# Patient Record
Sex: Male | Born: 1965 | Race: Black or African American | Hispanic: No | Smoking: Former smoker
Health system: Southern US, Community
[De-identification: ages and names within clinical notes are randomized; demographics above are authoritative.]

## PROBLEM LIST (undated history)

## (undated) DIAGNOSIS — G4730 Sleep apnea, unspecified: Secondary | ICD-10-CM

## (undated) DIAGNOSIS — F419 Anxiety disorder, unspecified: Secondary | ICD-10-CM

## (undated) DIAGNOSIS — I4891 Unspecified atrial fibrillation: Secondary | ICD-10-CM

## (undated) DIAGNOSIS — D649 Anemia, unspecified: Secondary | ICD-10-CM

## (undated) DIAGNOSIS — I10 Essential (primary) hypertension: Secondary | ICD-10-CM

## (undated) DIAGNOSIS — E7800 Pure hypercholesterolemia, unspecified: Secondary | ICD-10-CM

## (undated) DIAGNOSIS — M542 Cervicalgia: Secondary | ICD-10-CM

## (undated) DIAGNOSIS — G4733 Obstructive sleep apnea (adult) (pediatric): Principal | ICD-10-CM

## (undated) HISTORY — DX: Anemia, unspecified: D64.9

## (undated) HISTORY — DX: Obstructive sleep apnea (adult) (pediatric): G47.33

## (undated) HISTORY — DX: Unspecified atrial fibrillation: I48.91

## (undated) HISTORY — PX: NERVE SURGERY: SHX1016

## (undated) HISTORY — PX: OTHER SURGICAL HISTORY: SHX169

## (undated) HISTORY — DX: Sleep apnea, unspecified: G47.30

## (undated) HISTORY — PX: NECK SURGERY: SHX720

---

## 2001-06-22 ENCOUNTER — Emergency Department (HOSPITAL_COMMUNITY): Admission: EM | Admit: 2001-06-22 | Discharge: 2001-06-22 | Payer: Self-pay | Admitting: Emergency Medicine

## 2003-07-24 ENCOUNTER — Emergency Department (HOSPITAL_COMMUNITY): Admission: EM | Admit: 2003-07-24 | Discharge: 2003-07-24 | Payer: Self-pay | Admitting: Emergency Medicine

## 2004-09-18 ENCOUNTER — Ambulatory Visit: Payer: Self-pay | Admitting: Family Medicine

## 2004-09-30 ENCOUNTER — Ambulatory Visit: Payer: Self-pay | Admitting: Family Medicine

## 2005-02-19 ENCOUNTER — Ambulatory Visit: Payer: Self-pay | Admitting: Family Medicine

## 2006-05-05 ENCOUNTER — Ambulatory Visit (HOSPITAL_COMMUNITY): Admission: RE | Admit: 2006-05-05 | Discharge: 2006-05-05 | Payer: Self-pay | Admitting: Family Medicine

## 2007-07-01 ENCOUNTER — Encounter: Payer: Self-pay | Admitting: Family Medicine

## 2010-07-30 NOTE — Letter (Signed)
Summary: rpc chart  rpc chart   Imported By: Curtis Sites 04/11/2010 15:18:49  _____________________________________________________________________  External Attachment:    Type:   Image     Comment:   External Document

## 2013-02-13 ENCOUNTER — Emergency Department (HOSPITAL_COMMUNITY)
Admission: EM | Admit: 2013-02-13 | Discharge: 2013-02-14 | Disposition: A | Discharge status: Home / Self Care | Payer: Managed Care, Other (non HMO) | Attending: Emergency Medicine | Admitting: Emergency Medicine

## 2013-02-13 ENCOUNTER — Emergency Department (HOSPITAL_COMMUNITY): Payer: Managed Care, Other (non HMO)

## 2013-02-13 ENCOUNTER — Encounter (HOSPITAL_COMMUNITY): Payer: Self-pay | Admitting: Emergency Medicine

## 2013-02-13 DIAGNOSIS — I4891 Unspecified atrial fibrillation: Secondary | ICD-10-CM

## 2013-02-13 DIAGNOSIS — Z79899 Other long term (current) drug therapy: Secondary | ICD-10-CM | POA: Insufficient documentation

## 2013-02-13 DIAGNOSIS — R0602 Shortness of breath: Secondary | ICD-10-CM | POA: Insufficient documentation

## 2013-02-13 DIAGNOSIS — I10 Essential (primary) hypertension: Secondary | ICD-10-CM | POA: Insufficient documentation

## 2013-02-13 DIAGNOSIS — E876 Hypokalemia: Secondary | ICD-10-CM

## 2013-02-13 DIAGNOSIS — R0789 Other chest pain: Secondary | ICD-10-CM

## 2013-02-13 DIAGNOSIS — Z87891 Personal history of nicotine dependence: Secondary | ICD-10-CM | POA: Insufficient documentation

## 2013-02-13 HISTORY — DX: Essential (primary) hypertension: I10

## 2013-02-13 LAB — CBC
HCT: 40.6 % (ref 39.0–52.0)
Hemoglobin: 14.2 g/dL (ref 13.0–17.0)
MCH: 31.8 pg (ref 26.0–34.0)
MCHC: 35 g/dL (ref 30.0–36.0)
MCV: 90.8 fL (ref 78.0–100.0)
Platelets: 229 10*3/uL (ref 150–400)
RBC: 4.47 MIL/uL (ref 4.22–5.81)
RDW: 13.4 % (ref 11.5–15.5)
WBC: 7.2 K/uL (ref 4.0–10.5)

## 2013-02-13 LAB — BASIC METABOLIC PANEL WITH GFR
BUN: 14 mg/dL (ref 6–23)
Chloride: 97 meq/L (ref 96–112)
GFR calc Af Amer: >90 mL/min (ref >90)
GFR calc non Af Amer: 86 mL/min — ABNORMAL LOW (ref >90)
Potassium: 2.7 meq/L — CL (ref 3.5–5.1)

## 2013-02-13 LAB — BASIC METABOLIC PANEL
CO2: 24 mEq/L (ref 19–32)
Calcium: 9.6 mg/dL (ref 8.4–10.5)
Creatinine, Ser: 1.03 mg/dL (ref 0.50–1.35)
Glucose, Bld: 105 mg/dL — ABNORMAL HIGH (ref 70–99)
Sodium: 136 mEq/L (ref 135–145)

## 2013-02-13 LAB — HEPATIC FUNCTION PANEL
ALT: 43 U/L (ref 0–53)
AST: 48 U/L — ABNORMAL HIGH (ref 0–37)
Albumin: 4.5 g/dL (ref 3.5–5.2)
Alkaline Phosphatase: 103 U/L (ref 39–117)
Bilirubin, Direct: 0.2 mg/dL (ref 0.0–0.3)
Indirect Bilirubin: 0.6 mg/dL (ref 0.3–0.9)
Total Bilirubin: 0.8 mg/dL (ref 0.3–1.2)
Total Protein: 8.3 g/dL (ref 6.0–8.3)

## 2013-02-13 LAB — TROPONIN I: Troponin I: <0.3 ng/mL (ref <0.30)

## 2013-02-13 LAB — LIPASE, BLOOD: Lipase: 38 U/L (ref 11–59)

## 2013-02-13 MED ORDER — POTASSIUM CHLORIDE ER 20 MEQ PO TBCR: 20.0000 meq | EXTENDED_RELEASE_TABLET | Freq: Two times a day (BID) | ORAL | Status: DC

## 2013-02-13 MED ORDER — POTASSIUM CHLORIDE CRYS ER 20 MEQ PO TBCR
40.0000 meq | EXTENDED_RELEASE_TABLET | Freq: Once | ORAL | Status: AC
  Administered 2013-02-14: 40 meq via ORAL
  Filled 2013-02-13: qty 2

## 2013-02-13 MED ORDER — POTASSIUM CHLORIDE 10 MEQ/100ML IV SOLN
10.0000 meq | Freq: Once | INTRAVENOUS | Status: AC
  Administered 2013-02-13: 10 meq via INTRAVENOUS
  Filled 2013-02-13: qty 100

## 2013-02-13 MED ORDER — POTASSIUM CHLORIDE CRYS ER 20 MEQ PO TBCR
40.0000 meq | EXTENDED_RELEASE_TABLET | Freq: Once | ORAL | Status: AC
  Administered 2013-02-13: 40 meq via ORAL
  Filled 2013-02-13: qty 2

## 2013-02-13 MED ORDER — AMLODIPINE BESYLATE 5 MG PO TABS: 5.0000 mg | ORAL_TABLET | Freq: Every day | ORAL | Status: DC

## 2013-02-13 NOTE — ED Notes (Signed)
Patient states he was watching TV and began having chest pain about 15 minutes ago.  Patient states he has a little shortness of breath, but denies N/V or diaphoresis.  States feels like a bunch of weight sitting on his chest going into his throat.

## 2013-02-13 NOTE — ED Provider Notes (Signed)
CSN: [id number]     Arrival date & time 02/13/13  2008 History    This chart was scribed for Gavin Pound. Oletta Lamas, MD, by Yevette Edwards, ED Scribe. This patient was seen in room APA18/APA18 and the patient's care was started at 21:10.   First MD Initiated Contact with Patient 02/13/13 2108     Chief Complaint  Patient presents with  . Chest Pain    The history is provided by the patient. No language interpreter was used.   HPI Comments: William Kim is a 47 y.o. male, with HTN, who presents to the Emergency Department complaining of sudden-onset chest pain which began while he was watching television approximately two hours ago. The episode of chest pain persisted for almost half an hour; it is currently resolved at bedside. The pain radiated towards his jaw. He rates the pain as 9/10, and he describes the pain as "elephant sitting on his chest and going into his throat." The pt also experienced mild SOB in addition to the chest pain. He denies experiencing any anxiety, nausea, emesis, abdominal pain, or diaphoresis.  The pt had beef enchiladas and salad about thirty minutes before the episode of chest pain. He reports that he ate his typical amount at dinner. The pt drinks a couple of [employer]ite nightly. He states that he has experienced similar symptoms in the past, including an episode three months ago, but much more mild. He denies any h/o cardiac issues within his immediate family. He denies any recent travels. He also denies any known allergies. 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   Past Medical History  Diagnosis Date  . Hypertension    History reviewed. No pertinent past surgical history. History reviewed. No pertinent family history. History  Substance Use Topics  . Smoking status: Former Games developer  . Smokeless tobacco: Not on file  . Alcohol Use: Yes    Review of Systems  Constitutional: Negative for diaphoresis.  Respiratory: Positive for shortness of  breath.   Cardiovascular: Positive for chest pain.  Gastrointestinal: Negative for nausea, vomiting and abdominal pain.  Psychiatric/Behavioral: The patient is not nervous/anxious.   All other systems reviewed and are negative.    Allergies  Review of patient's allergies indicates no known allergies.  Home Medications   Current Outpatient Rx  Name  Route  Sig  Dispense  Refill  . amLODipine (NORVASC) 5 MG tablet   Oral   Take 1 tablet (5 mg total) by mouth daily.   20 tablet   0   . potassium chloride 20 MEQ TBCR   Oral   Take 20 mEq by mouth 2 (two) times daily.   6 tablet   0    Triage Vitals: BP 141/76  Pulse 58  Temp(Src) 98.2 F (36.8 C) (Oral)  Resp 20  Ht 6\' 2"  (1.88 m)  Wt 217 lb (98.431 kg)  BMI 27.85 kg/m2  SpO2 98%  Physical Exam  Nursing note and vitals reviewed. Constitutional: He is oriented to person, place, and time. He appears well-developed and well-nourished. No distress.  HENT:  Head: Normocephalic and atraumatic.  Eyes: EOM are normal.  Neck: Neck supple. No tracheal deviation present.  Cardiovascular: Regular rhythm and normal heart sounds.   No murmur heard. Tachycardic.   Pulmonary/Chest: Effort normal and breath sounds normal. No respiratory distress.  Abdominal: Soft. There is no tenderness.  Genitourinary:  Clear urine.   Musculoskeletal: Normal range of motion.  Neurological: He is  alert and oriented to person, place, and time.  Skin: Skin is warm and dry.  Psychiatric: He has a normal mood and affect. His behavior is normal.    ED Course   DIAGNOSTIC STUDIES:  Oxygen Saturation is 98% on room air, normal by my interpretation.    COORDINATION OF CARE:  9:22 PM- Discussed treatment plan with patient, and the patient agreed to the plan.   Procedures (including critical care time)  Labs Reviewed  BASIC METABOLIC PANEL - Abnormal; Notable for the following:    Potassium 2.7 (*)    Glucose, Bld 105 (*)    GFR calc non Af  Amer 86 (*)    All other components within normal limits  HEPATIC FUNCTION PANEL - Abnormal; Notable for the following:    AST 48 (*)    All other components within normal limits  POTASSIUM - Abnormal; Notable for the following:    Potassium 2.9 (*)    All other components within normal limits  CBC  TROPONIN I  LIPASE, BLOOD  TROPONIN I   No results found. 1. Atrial fibrillation with RVR   2. Chest pressure   3. Hypokalemia        ECG at time 20:16 shows atrial fibrillation with RVR at rate 145, LVH, ST depression and T wave abn inferiorly, likely related to rate.  Abn ECG.  Normal axis.     10:31 PM Repeat ECG at time 22:21 shows NSR at rate 99, LVH, non specific T wave abn inferiorly. Given continued non specific changes, which could be still reflective of hypokalemia, pt is encouraged to follow up with cardiology this week and with Dr. Regino Schultze closely and to repeat BMP and consider BP medication change and to curb alcohol use can be discussed and managed.     11:45PM ptsigned put to Dr. Hyacinth Meeker to follow up on second troponin and repeat potsssium.nofurther episodes of arrythmia seen in the ED.  MDM  I personally performed the services described in this documentation, which was scribed in my presence. The recorded information has been reviewed and considered.   Pt drank a lot of beer tonight after working 6 days straight. Also takes HCTZ fro BP control.  K+ is down to 2.7.  This likely led to cardiac iiritation and led to atrial fib causing pressure.  Pt's pressure is resolved and this seems to coincide based on monitor with conversion to sinus.  Will cycle enzymes, replenish K+ and he is told to follow up with Dr. Regino Schultze to consider change of BP meds especially if he continues to drink a lot of beer and diurese.      Gavin Pound. Oletta Lamas, MD 02/17/13 2208

## 2013-02-13 NOTE — ED Notes (Signed)
EKG performed by Lilia Pro, RN in Room 18; looked for old EKG in M[facility] and there was none.  Handed current EKG to Dr Clarene Duke.

## 2013-02-13 NOTE — Discharge Instructions (Signed)
 Atrial Fibrillation Your caregiver has diagnosed you with atrial fibrillation (AFib). The heart normally beats very regularly; AFib is a type of irregular heartbeat. The heart rate may be faster or slower than normal. This can prevent your heart from pumping as well as it should. AFib can be constant (chronic) or intermittent (paroxysmal). CAUSES  Atrial fibrillation may be caused by:  Heart disease, including heart attack, coronary artery disease, heart failure, diseases of the heart valves, and others.  Blood clot in the lungs (pulmonary embolism).  Pneumonia or other infections.  Chronic lung disease.  Thyroid  disease.  Toxins. These include alcohol, some medications (such as decongestant medications or diet pills), and caffeine. In some people, no cause for AFib can be found. This is referred to as Lone Atrial Fibrillation. SYMPTOMS   Palpitations or a fluttering in your chest.  A vague sense of chest discomfort.  Shortness of breath.  Sudden onset of lightheadedness or weakness. Sometimes, the first sign of AFib can be a complication of the condition. This could be a stroke or heart failure. DIAGNOSIS  Your description of your condition may make your caregiver suspicious of atrial fibrillation. Your caregiver will examine your pulse to determine if fibrillation is present. An EKG (electrocardiogram) will confirm the diagnosis. Further testing may help determine what caused you to have atrial fibrillation. This may include chest x-ray, echocardiogram, blood tests, or CT scans. PREVENTION  If you have previously had atrial fibrillation, your caregiver may advise you to avoid substances known to cause the condition (such as stimulant medications, and possibly caffeine or alcohol). You may be advised to use medications to prevent recurrence. Proper treatment of any underlying condition is important to help prevent recurrence. PROGNOSIS  Atrial fibrillation does tend to become a  chronic condition over time. It can cause significant complications (see below). Atrial fibrillation is not usually immediately life-threatening, but it can shorten your life expectancy. This seems to be worse in women. If you have lone atrial fibrillation and are under 28 years old, the risk of complications is very low, and life expectancy is not shortened. RISKS AND COMPLICATIONS  Complications of atrial fibrillation can include stroke, chest pain, and heart failure. Your caregiver will recommend treatments for the atrial fibrillation, as well as for any underlying conditions, to help minimize risk of complications. TREATMENT  Treatment for AFib is divided into several categories:  Treatment of any underlying condition.  Converting you out of AFib into a regular (sinus) rhythm.  Controlling rapid heart rate.  Prevention of blood clots and stroke. Medications and procedures are available to convert your atrial fibrillation to sinus rhythm. However, recent studies have shown that this may not offer you any advantage, and cardiac experts are continuing research and debate on this topic. More important is controlling your rapid heartbeat. The rapid heartbeat causes more symptoms, and places strain on your heart. Your caregiver will advise you on the use of medications that can control your heart rate. Atrial fibrillation is a strong stroke risk. You can lessen this risk by taking blood thinning medications such as Coumadin (warfarin), or sometimes aspirin. These medications need close monitoring by your caregiver. Over-medication can cause bleeding. Too little medication may not protect against stroke. HOME CARE INSTRUCTIONS   If your caregiver prescribed medicine to make your heartbeat more normally, take as directed.  If blood thinners were prescribed by your caregiver, take EXACTLY as directed.  Perform blood tests EXACTLY as directed.  Quit smoking. Smoking increases your cardiac and  lung  (pulmonary) risks.  DO NOT drink alcohol.  DO NOT drink caffeinated drinks (e.g. coffee, soda, chocolate, and leaf teas). You may drink decaffeinated coffee, soda or tea.  If you are overweight, you should choose a reduced calorie diet to lose weight. Please see a registered dietitian if you need more information about healthy weight loss. DO NOT USE DIET PILLS as they may aggravate heart problems.  If you have other heart problems that are causing AFib, you may need to eat a low salt, fat, and cholesterol diet. Your caregiver will tell you if this is necessary.  Exercise every day to improve your physical fitness. Stay active unless advised otherwise.  If your caregiver has given you a follow-up appointment, it is very important to keep that appointment. Not keeping the appointment could result in heart failure or stroke. If there is any problem keeping the appointment, you must call back to this facility for assistance. SEEK MEDICAL CARE IF:  You notice a change in the rate, rhythm or strength of your heartbeat.  You develop an infection or any other change in your overall health status. SEEK IMMEDIATE MEDICAL CARE IF:   You develop chest pain, abdominal pain, sweating, weakness or feel sick to your stomach (nausea).  You develop shortness of breath.  You develop swollen feet and ankles.  You develop dizziness, numbness, or weakness of your face or limbs, or any change in vision or speech. MAKE SURE YOU:   Understand these instructions.  Will watch your condition.  Will get help right away if you are not doing well or get worse. Document Released: 06/16/2005 Document Revised: 09/08/2011 Document Reviewed: 01/23/2010 [facility] Patient Information 2014 [place], [place].     Hypokalemia Hypokalemia means a low potassium level in the blood.Potassium is an electrolyte that helps regulate the amount of fluid in the body. It also stimulates muscle contraction and maintains a stable  acid-base balance.Most of the body's potassium is inside of cells, and only a very small amount is in the blood. Because the amount in the blood is so small, minor changes can have big effects. PREPARATION FOR TEST Testing for potassium requires taking a blood sample taken by needle from a vein in the arm. The skin is cleaned thoroughly before the sample is drawn. There is no other special preparation needed. NORMAL VALUES Potassium levels below 3.5 mEq/L are abnormally low. Levels above 5.1 mEq/L are abnormally high. Ranges for normal findings may vary among different laboratories and hospitals. You should always check with your doctor after having lab work or other tests done to discuss the meaning of your test results and whether your values are considered within normal limits. MEANING OF TEST  Your caregiver will go over the test results with you and discuss the importance and meaning of your results, as well as treatment options and the need for additional tests, if necessary. A potassium level is frequently part of a routine medical exam. It is usually included as part of a whole panel of tests for several blood salts (such as Sodium and Chloride). It may be done as part of follow-up when a low potassium level was found in the past or other blood salts are suspected of being out of balance. A low potassium level might be suspected if you have one or more of the following:  Symptoms of weakness.  Abnormal heart rhythms.  High blood pressure and are taking medication to control this, especially water  pills (diuretics).  Kidney disease that  can affect your potassium level .  Diabetes requiring the use of insulin. The potassium may fall after taking insulin, especially if the diabetes had been out of control for a while.  A condition requiring the use of cortisone-type medication or certain types of antibiotics.  Vomiting and/or diarrhea for more than a day or two.  A stomach or  intestinal condition that may not permit appropriate absorption of potassium.  Fainting episodes.  Mental confusion. OBTAINING TEST RESULTS It is your responsibility to obtain your test results. Ask the lab or department performing the test when and how you will get your results.  Please contact your caregiver directly if you have not received the results within one week. At that time, ask if there is anything different or new you should be doing in relation to the results. TREATMENT Hypokalemia can be treated with potassium supplements taken by mouth and/or adjustments in your current medications. A diet high in potassium is also helpful. Foods with high potassium content are:  Peas, lentils, lima beans, nuts, and dried fruit.  Whole grain and bran cereals and breads.  Fresh fruit, vegetables (bananas, cantaloupe, grapefruit, oranges, tomatoes, honeydew melons, potatoes).  Orange and tomato juices.  Meats. If potassium supplement has been prescribed for you today or your medications have been adjusted, see your personal caregiver in time02 for a re-check. SEEK MEDICAL CARE IF:  There is a feeling of worsening weakness.  You experience repeated chest palpitations.  You are diabetic and having difficulty keeping your blood sugars in the normal range.  You are experiencing vomiting and/or diarrhea.  You are having difficulty with any of your regular medications. SEEK IMMEDIATE MEDICAL CARE IF:  You experience chest pain, shortness of breath, or episodes of dizziness.  You have been having vomiting or diarrhea for more than 2 days.  You have a fainting episode. MAKE SURE YOU:   Understand these instructions.  Will watch your condition.  Will get help right away if you are not doing well or get worse. Document Released: 06/16/2005 Document Revised: 09/08/2011 Document Reviewed: 05/27/2008 [facility] Patient Information 2014 [place], [place].   Foods Rich in  Potassium Food / Potassium (mg)  Apricots, dried,  cup / 378 mg   Apricots, raw, 1 cup halves / 401 mg   Avocado,  / 487 mg   Banana, 1 large / 487 mg   Beef, lean, round, 3 oz / 202 mg   Cantaloupe, 1 cup cubes / 427 mg   Dates, medjool, 5 whole / 835 mg   Ham, cured, 3 oz / 212 mg   Lentils, dried,  cup / 458 mg   Lima beans, frozen,  cup / 258 mg   Orange, 1 large / 333 mg   Orange juice, 1 cup / 443 mg   Peaches, dried,  cup / 398 mg   Peas, split, cooked,  cup / 355 mg   Potato, boiled, 1 medium / 515 mg   Prunes, dried, uncooked,  cup / 318 mg   Raisins,  cup / 309 mg   Salmon, pink, raw, 3 oz / 275 mg   Sardines, canned , 3 oz / 338 mg   Tomato, raw, 1 medium / 292 mg   Tomato juice, 6 oz / 417 mg   Turkey, 3 oz / 349 mg  Document Released: 06/16/2005 Document Revised: 02/26/2011 Document Reviewed: 10/30/2008 [facility] Patient Information 2012 [place], [place].

## 2013-02-13 NOTE — ED Notes (Signed)
CRITICAL VALUE ALERT  Critical value received:  K+ 2.7 Date of notification:  02/13/2013 Time of notification:  2112 Critical value read back:yes Nurse who received alert:  Charlette Caffey, RN  MD notified: Dr. Oletta Lamas [id number]

## 2013-02-14 LAB — TROPONIN I: Troponin I: <0.3 ng/mL (ref <0.30)

## 2013-02-14 LAB — POTASSIUM: Potassium: 2.9 mEq/L — ABNORMAL LOW (ref 3.5–5.1)

## 2013-02-14 NOTE — ED Provider Notes (Signed)
K improved, pt has no return of afib - given residual K and d/c in good condition without symptoms.  norvasc started per Dr. Oletta Lamas, - pt given instructions and asked to f/u and obtain copy of EMR to share with his physician.  Expresses understanding.  Vida Roller, MD 02/14/13 [phone]

## 2016-04-07 ENCOUNTER — Ambulatory Visit (INDEPENDENT_AMBULATORY_CARE_PROVIDER_SITE_OTHER): Payer: Managed Care, Other (non HMO) | Admitting: Otolaryngology

## 2016-04-07 DIAGNOSIS — K1121 Acute sialoadenitis: Secondary | ICD-10-CM | POA: Diagnosis not present

## 2016-04-07 DIAGNOSIS — D37030 Neoplasm of uncertain behavior of the parotid salivary glands: Secondary | ICD-10-CM | POA: Diagnosis not present

## 2016-04-10 ENCOUNTER — Other Ambulatory Visit (INDEPENDENT_AMBULATORY_CARE_PROVIDER_SITE_OTHER): Payer: Self-pay | Admitting: Otolaryngology

## 2016-04-10 DIAGNOSIS — D37030 Neoplasm of uncertain behavior of the parotid salivary glands: Secondary | ICD-10-CM

## 2016-04-21 ENCOUNTER — Ambulatory Visit (HOSPITAL_COMMUNITY)
Admission: RE | Admit: 2016-04-21 | Discharge: 2016-04-21 | Disposition: A | Discharge status: Home / Self Care | Payer: Managed Care, Other (non HMO) | Source: Ambulatory Visit | Attending: Otolaryngology | Admitting: Otolaryngology

## 2016-04-21 DIAGNOSIS — D37030 Neoplasm of uncertain behavior of the parotid salivary glands: Secondary | ICD-10-CM | POA: Insufficient documentation

## 2016-04-21 MED ORDER — IOPAMIDOL (ISOVUE-300) INJECTION 61%
75.0000 mL | Freq: Once | INTRAVENOUS | Status: AC | PRN
  Administered 2016-04-21: 75 mL via INTRAVENOUS

## 2016-04-23 ENCOUNTER — Emergency Department (HOSPITAL_COMMUNITY): Payer: Managed Care, Other (non HMO)

## 2016-04-23 ENCOUNTER — Encounter (HOSPITAL_COMMUNITY): Payer: Self-pay | Admitting: Emergency Medicine

## 2016-04-23 ENCOUNTER — Emergency Department (HOSPITAL_COMMUNITY)
Admission: EM | Admit: 2016-04-23 | Discharge: 2016-04-23 | Disposition: A | Discharge status: Home / Self Care | Payer: Managed Care, Other (non HMO) | Attending: Emergency Medicine | Admitting: Emergency Medicine

## 2016-04-23 DIAGNOSIS — Z79899 Other long term (current) drug therapy: Secondary | ICD-10-CM | POA: Insufficient documentation

## 2016-04-23 DIAGNOSIS — R0789 Other chest pain: Secondary | ICD-10-CM

## 2016-04-23 DIAGNOSIS — I10 Essential (primary) hypertension: Secondary | ICD-10-CM | POA: Insufficient documentation

## 2016-04-23 DIAGNOSIS — Z87891 Personal history of nicotine dependence: Secondary | ICD-10-CM | POA: Insufficient documentation

## 2016-04-23 DIAGNOSIS — R0602 Shortness of breath: Secondary | ICD-10-CM | POA: Diagnosis present

## 2016-04-23 DIAGNOSIS — R002 Palpitations: Secondary | ICD-10-CM | POA: Insufficient documentation

## 2016-04-23 HISTORY — DX: Pure hypercholesterolemia, unspecified: E78.00

## 2016-04-23 LAB — CBC
HEMATOCRIT: 38 % — AB (ref 39.0–52.0)
Hemoglobin: 12.8 g/dL — ABNORMAL LOW (ref 13.0–17.0)
MCH: 30.7 pg (ref 26.0–34.0)
MCHC: 33.7 g/dL (ref 30.0–36.0)
MCV: 91.1 fL (ref 78.0–100.0)
Platelets: 224 10*3/uL (ref 150–400)
RBC: 4.17 MIL/uL — AB (ref 4.22–5.81)
RDW: 14.5 % (ref 11.5–15.5)
WBC: 6 10*3/uL (ref 4.0–10.5)

## 2016-04-23 LAB — BASIC METABOLIC PANEL
ANION GAP: 6 (ref 5–15)
BUN: 15 mg/dL (ref 6–20)
CHLORIDE: 108 mmol/L (ref 101–111)
CO2: 25 mmol/L (ref 22–32)
Calcium: 9 mg/dL (ref 8.9–10.3)
Creatinine, Ser: 0.93 mg/dL (ref 0.61–1.24)
Glucose, Bld: 92 mg/dL (ref 65–99)
POTASSIUM: 3.4 mmol/L — AB (ref 3.5–5.1)
SODIUM: 139 mmol/L (ref 135–145)

## 2016-04-23 LAB — I-STAT TROPONIN, ED
TROPONIN I, POC: 0 ng/mL (ref 0.00–0.08)
Troponin i, poc: 0 ng/mL (ref 0.00–0.08)

## 2016-04-23 NOTE — ED Provider Notes (Signed)
[facility] DEPT Provider Note   CSN: [id number] Arrival date & time: 04/23/16  1217  By signing my name below, I, Higinio Plan, attest that this documentation has been prepared under the direction and in the presence of Forde Dandy, MD . Electronically Signed: Higinio Plan, Scribe. 04/23/2016. 1:34 PM.  History   Chief Complaint Chief Complaint  Patient presents with  . Chest Pain  . Shortness of Breath   The history is provided by the patient. No language interpreter was used.   HPI Comments: William Kim is a 50 y.o. male with PMHx of HTN and HLD,  who presents to the Emergency Department complaining of intermittent shortness of breath and palpitations that began 2 months ago, with episode this morning. Denies chest pain with this, but states if anything he may feel a tiny amount of chest pressure. He has had 4-5 episodes of this over the last 2 months.  Episodes primarily, while he is getting ready for work in the morning, and self resolved after a few hours. States that initially he is very panicked and anxious with this, but it eases off after a few minutes.He states he has experienced  the same symptoms every 2 weeks. He reports his last episode was yesterday but he began to experience a sensation of palpitations while at work today which is why he visited the ED. Denies that his symptoms come on during exertional activity although he does get breathless if he is on his feet for too long moving or runny around at work (but this feels differently than that episode sob that he presents with today).   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 He denies nausea, vomiting, leg pain or swelling, loss of consciousness, cough, fever,  hx of PE, recent long travel or immobilzation. He also denies FHx of heart problems. Pt notes he quit smoking 4 years go.   Past Medical History:  Diagnosis Date  . High cholesterol   . Hypertension    There are no active problems to display for this patient.  History reviewed. No pertinent surgical history.  Home Medications    Prior to Admission medications   Medication Sig Start Date End Date Taking? Authorizing Provider  amLODipine (NORVASC) 5 MG tablet Take 1 tablet (5 mg total) by mouth daily. 02/13/13  Yes Kingsley Spittle, MD  Ascorbic Acid (VITAMIN C PO) Take 1 tablet by mouth daily.   Yes Historical Provider, MD  Omega-3 Fatty Acids (FISH OIL PO) Take 1 capsule by mouth at bedtime.   Yes Historical Provider, MD  oxyCODONE-acetaminophen (PERCOCET/ROXICET) 5-325 MG tablet Take 1 tablet by mouth 3 (three) times daily as needed for pain. 03/29/16  Yes Historical Provider, MD  simvastatin (ZOCOR) 40 MG tablet Take 1 tablet by mouth at bedtime. 03/04/16  Yes Historical Provider, MD  valACYclovir (VALTREX) 500 MG tablet Take 1 tablet by mouth daily. 03/30/16  Yes Historical Provider, MD  diclofenac (VOLTAREN) 75 MG EC tablet Take 1 tablet by mouth daily as needed for pain. 03/07/16   Historical Provider, MD    Family History History reviewed. No pertinent family history.  Social History Social History  Substance Use Topics  . Smoking status:  Former Smoker  . Smokeless tobacco: Never Used  . Alcohol use Yes     Comment: "1-2 beers and maybe a mixed drink a day"   Allergies   Review of patient's allergies indicates no known allergies.  Review of Systems Review of Systems  Constitutional: Negative for fever.  Respiratory: Positive for shortness of breath. Negative for cough.   Cardiovascular: Positive for chest pain (described as pressure) and palpitations. Negative for leg swelling.  Gastrointestinal: Negative for nausea and vomiting.  Neurological: Negative for syncope.  All other  systems reviewed and are negative.  Physical Exam Updated Vital Signs BP 131/84   Pulse 74   Temp 98.4 F (36.9 C) (Oral)   Resp 22   Ht 6' (1.829 m)   Wt 215 lb (97.5 kg)   SpO2 97%   BMI 29.16 kg/m   Physical Exam Physical Exam  Nursing note and vitals reviewed. Constitutional: Well developed, well nourished, non-toxic, and in no acute distress Head: Normocephalic and atraumatic.  Mouth/Throat: Oropharynx is clear and moist.  Neck: Normal range of motion. Neck supple.  Cardiovascular: Normal rate and regular rhythm.   Pulmonary/Chest: Effort normal and breath sounds normal.  Abdominal: Soft. There is no tenderness. There is no rebound and no guarding.  Musculoskeletal: Normal range of motion.  Neurological: Alert, no facial droop, fluent speech, moves all extremities symmetrically Skin: Skin is warm and dry.  Psychiatric: Cooperative  ED Treatments / Results  Labs (all labs ordered are listed, but only abnormal results are displayed) Labs Reviewed  BASIC METABOLIC PANEL - Abnormal; Notable for the following:       Result Value   Potassium 3.4 (*)    All other components within normal limits  CBC - Abnormal; Notable for the following:    RBC 4.17 (*)    Hemoglobin 12.8 (*)    HCT 38.0 (*)    All other components within normal limits  I-STAT TROPOININ, ED  I-STAT TROPOININ, ED    EKG  EKG Interpretation  Date/Time:  Wednesday April 23 2016 15:57:05 EDT Ventricular Rate:  85 PR Interval:  158 QRS Duration: 92 QT Interval:  354 QTC Calculation: 421 R Axis:   59 Text Interpretation:  Sinus rhythm Probable left atrial enlargement Similar to prior EKG  Confirmed by Talene Glastetter MD, Yacoub Diltz [phone]) on 04/23/2016 4:05:59 PM       Radiology Dg Chest 2 View  Result Date: 04/23/2016 CLINICAL DATA:  Episodes of chest tightness, occur intermittently over the past 6 weeks. EXAM: CHEST  2 VIEW COMPARISON:  02/13/2013. FINDINGS: The heart size and mediastinal contours are  within normal limits. Both lungs are clear. The visualized skeletal structures are unremarkable. IMPRESSION: No active cardiopulmonary disease.  Stable appearance from priors. Electronically Signed   By: Staci Righter M.D.   On: 04/23/2016 14:10   Ct Soft Tissue Neck W Contrast  Result Date: 04/22/2016 CLINICAL DATA:  50 year old male with swelling right-sided neck for the past year. Question neoplasm parotid gland. Initial encounter. EXAM: CT NECK WITH CONTRAST TECHNIQUE: Multidetector CT imaging of the neck was performed using the standard protocol following the bolus administration of intravenous contrast. CONTRAST:  65mL ISOVUE-300 IOPAMIDOL (ISOVUE-300) INJECTION 61% COMPARISON:  None. FINDINGS: Pharynx and larynx: Symmetric mild prominence of lymphoid tissue of Waldeyer's ring without mass identified. Salivary glands: Where patient has palpable a abnormality of the right parotid gland, no parotid lesion is identified. Minimal asymmetry of soft tissue along the anterior margin of the right external auditory  canal. This may represent a normal finding (located superior to palpable abnormality). However, if patient has symptoms referable to this region, MR imaging with attention to this region can be obtained. Thyroid: No mass. Lymph nodes: Scattered normal size and fatty containing lymph nodes without worrisome adenopathy. Vascular: Negative. Limited intracranial: Negative. Visualized orbits: Negative. Mastoids and visualized paranasal sinuses: Clear. Skeleton: Cervical spondylotic changes C6-7. Upper chest: No worrisome lesion. Other: Negative. IMPRESSION: Where patient has palpable a abnormality of the right parotid gland, no parotid lesion is identified. Minimal asymmetry of soft tissue along the anterior margin of the right external auditory canal. This may represent an normal finding (located superior to right parotid region palpable abnormality). However, if patient has symptoms referable to this  region, MR imaging with attention to this region can be obtained for further delineation. Electronically Signed   By: Genia Del M.D.   On: 04/22/2016 07:28    Procedures Procedures (including critical care time)  Medications Ordered in ED Medications - No data to display  DIAGNOSTIC STUDIES:  Oxygen Saturation is 97% on RA, normal by my interpretation.    COORDINATION OF CARE:  1:30 PM Discussed treatment plan with pt at bedside and pt agreed to plan.  Initial Impression / Assessment and Plan / ED Course  I have reviewed the triage vital signs and the nursing notes.  Pertinent labs & imaging results that were available during my care of the patient were reviewed by me and considered in my medical decision making (see chart for details).  Clinical Course    50 year old male Who presents with episodic shortness of breath and palpitations. Atypical presentation for cardiopulmonary processes, as typically the symptoms do not occur with activity or exertion primarily in the morning before starting work. He currently is asymptomatic. He is well-appearing in no acute distress with stable vital signs. Exam otherwise non-focal.  EKG with non-specific repol changes but no acute ischemic changes. Serial troponins negative and EKG w/o acute dynamic type changes. Heart score of 3, for age, HTN and HLD, and non-specific EKG. Still considered lower risk and at this time, I think he could continue outpatient work-up with PCP with stress test and ECHO.   CXR showing no infiltrate, edema or other acute cardiopulmonary processes. Symptoms not suggestive of acute CHF. The symptoms are also atypical for that a PE. He is PERC negative.   The patient appears reasonably screened and/or stabilized for discharge and I doubt any other medical condition or other [facility] requiring further screening, evaluation, or treatment in the ED at this time prior to discharge.  Strict return and follow-up instructions  reviewed. He expressed understanding of all discharge instructions and felt comfortable with the plan of care.   I personally performed the services described in this documentation, which was scribed in my presence. The recorded information has been reviewed and is accurate.   Final Clinical Impressions(s) / ED Diagnoses   Final diagnoses:  Shortness of breath  Palpitations  Chest pressure    New Prescriptions Discharge Medication List as of 04/23/2016  4:23 PM       Forde Dandy, MD 04/23/16 [phone]

## 2016-04-23 NOTE — Discharge Instructions (Signed)
Please follow-up closely with your primary care doctor to arrange stress test or any further testing as needed.  Return without fail for worsening symptoms, including passing out, worsening pain, more frequent episodes, difficulty breathing or any other symptoms concerning to you.

## 2016-04-23 NOTE — ED Triage Notes (Signed)
Patient complaining of shortness of breath, chest pressure, and "feeling like my heart is pounding" x 5-6 weeks. States today he is short of breath with chest pressure.

## 2016-05-01 ENCOUNTER — Ambulatory Visit (INDEPENDENT_AMBULATORY_CARE_PROVIDER_SITE_OTHER): Payer: Managed Care, Other (non HMO) | Admitting: Otolaryngology

## 2016-05-01 DIAGNOSIS — R590 Localized enlarged lymph nodes: Secondary | ICD-10-CM | POA: Diagnosis not present

## 2016-06-05 ENCOUNTER — Ambulatory Visit (INDEPENDENT_AMBULATORY_CARE_PROVIDER_SITE_OTHER): Payer: Managed Care, Other (non HMO) | Admitting: Otolaryngology

## 2016-06-05 DIAGNOSIS — D487 Neoplasm of uncertain behavior of other specified sites: Secondary | ICD-10-CM | POA: Diagnosis not present

## 2017-01-27 ENCOUNTER — Encounter (HOSPITAL_COMMUNITY): Payer: Self-pay | Admitting: Emergency Medicine

## 2017-01-27 ENCOUNTER — Emergency Department (HOSPITAL_COMMUNITY)
Admission: EM | Admit: 2017-01-27 | Discharge: 2017-01-27 | Disposition: A | Discharge status: Home / Self Care | Payer: 59 | Attending: Emergency Medicine | Admitting: Emergency Medicine

## 2017-01-27 ENCOUNTER — Emergency Department (HOSPITAL_COMMUNITY): Payer: 59

## 2017-01-27 DIAGNOSIS — I4891 Unspecified atrial fibrillation: Secondary | ICD-10-CM | POA: Diagnosis not present

## 2017-01-27 DIAGNOSIS — I10 Essential (primary) hypertension: Secondary | ICD-10-CM | POA: Insufficient documentation

## 2017-01-27 DIAGNOSIS — I480 Paroxysmal atrial fibrillation: Secondary | ICD-10-CM | POA: Insufficient documentation

## 2017-01-27 DIAGNOSIS — Z79899 Other long term (current) drug therapy: Secondary | ICD-10-CM | POA: Insufficient documentation

## 2017-01-27 DIAGNOSIS — Z87891 Personal history of nicotine dependence: Secondary | ICD-10-CM | POA: Insufficient documentation

## 2017-01-27 DIAGNOSIS — R079 Chest pain, unspecified: Secondary | ICD-10-CM | POA: Diagnosis present

## 2017-01-27 LAB — CBC WITH DIFFERENTIAL/PLATELET
BASOS PCT: 0 %
Basophils Absolute: 0 10*3/uL (ref 0.0–0.1)
Eosinophils Absolute: 0.1 10*3/uL (ref 0.0–0.7)
Eosinophils Relative: 1 %
HEMATOCRIT: 41 % (ref 39.0–52.0)
Hemoglobin: 13.9 g/dL (ref 13.0–17.0)
LYMPHS PCT: 32 %
Lymphs Abs: 2 10*3/uL (ref 0.7–4.0)
MCH: 30.7 pg (ref 26.0–34.0)
MCHC: 33.9 g/dL (ref 30.0–36.0)
MCV: 90.5 fL (ref 78.0–100.0)
MONO ABS: 0.5 10*3/uL (ref 0.1–1.0)
MONOS PCT: 7 %
NEUTROS ABS: 3.7 10*3/uL (ref 1.7–7.7)
Neutrophils Relative %: 60 %
Platelets: 195 10*3/uL (ref 150–400)
RBC: 4.53 MIL/uL (ref 4.22–5.81)
RDW: 14.4 % (ref 11.5–15.5)
WBC: 6.3 10*3/uL (ref 4.0–10.5)

## 2017-01-27 LAB — TSH: TSH: 1.434 u[IU]/mL (ref 0.350–4.500)

## 2017-01-27 LAB — BASIC METABOLIC PANEL
ANION GAP: 7 (ref 5–15)
BUN: 16 mg/dL (ref 6–20)
CALCIUM: 9.3 mg/dL (ref 8.9–10.3)
CO2: 25 mmol/L (ref 22–32)
Chloride: 108 mmol/L (ref 101–111)
Creatinine, Ser: 0.94 mg/dL (ref 0.61–1.24)
GFR calc Af Amer: >60 mL/min (ref >60)
GFR calc non Af Amer: >60 mL/min (ref >60)
GLUCOSE: 100 mg/dL — AB (ref 65–99)
Potassium: 3.8 mmol/L (ref 3.5–5.1)
Sodium: 140 mmol/L (ref 135–145)

## 2017-01-27 LAB — I-STAT TROPONIN, ED: Troponin i, poc: 0 ng/mL (ref 0.00–0.08)

## 2017-01-27 MED ORDER — DILTIAZEM HCL 100 MG IV SOLR
5.0000 mg/h | INTRAVENOUS | Status: DC
  Administered 2017-01-27: 5 mg/h via INTRAVENOUS
  Filled 2017-01-27: qty 100

## 2017-01-27 MED ORDER — DILTIAZEM LOAD VIA INFUSION
20.0000 mg | Freq: Once | INTRAVENOUS | Status: AC
  Administered 2017-01-27: 20 mg via INTRAVENOUS
  Filled 2017-01-27: qty 20

## 2017-01-27 MED ORDER — APIXABAN 5 MG PO TABS: 5.0000 mg | ORAL_TABLET | Freq: Two times a day (BID) | ORAL | 0 refills | Status: DC

## 2017-01-27 MED ORDER — DILTIAZEM HCL ER COATED BEADS 120 MG PO CP24
120.0000 mg | ORAL_CAPSULE | Freq: Once | ORAL | Status: AC
  Administered 2017-01-27: 120 mg via ORAL
  Filled 2017-01-27: qty 1

## 2017-01-27 MED ORDER — DILTIAZEM HCL ER COATED BEADS 120 MG PO CP24: 120.0000 mg | ORAL_CAPSULE | Freq: Every day | ORAL | 0 refills | Status: DC

## 2017-01-27 NOTE — ED Notes (Signed)
Cardizem drip stopped per Dr. Venora Maples verbal order.

## 2017-01-27 NOTE — ED Provider Notes (Addendum)
[facility] DEPT Provider Note   CSN: [id number] Arrival date & time: 01/27/17  1217     History   Chief Complaint Chief Complaint  Patient presents with  . Chest Pain    HPI William Kim is a 51 y.o. male.  HPI Patient is a 51 year old male with a history of intermittent palpitations associated shortness breath over the past 2 years.  He presents emergency department in atrial fibrillation with rapid ventricular response.  He states his had symptoms over the past 2 days.  In 2014 he was seen in emergency department and diagnosed with atrial fibrillation with rapid ventricular response but he never followed up with cardiology.  He admits to several beers daily and a mixed drink every day.  He denies excessive caffeine intake.  No recent cough or congestion.  Denies orthopnea.  No unilateral leg swelling.   Past Medical History:  Diagnosis Date  . High cholesterol   . Hypertension     There are no active problems to display for this patient.   History reviewed. No pertinent surgical history.     Home Medications    Prior to Admission medications   Medication Sig Start Date End Date Taking? Authorizing Provider  amLODipine (NORVASC) 5 MG tablet Take 1 tablet (5 mg total) by mouth daily. 02/13/13  Yes Kingsley Spittle, MD  Ascorbic Acid (VITAMIN C PO) Take 1 tablet by mouth daily.   Yes [provider]  Cranberry 1000 MG CAPS Take 1 capsule by mouth daily.   Yes [provider]  Nyoka Cowden Tea 150 MG CAPS Take 1 capsule by mouth daily.   Yes [provider]  Omega-3 Fatty Acids (FISH OIL PO) Take 1 capsule by mouth at bedtime.   Yes [provider]  simvastatin (ZOCOR) 40 MG tablet Take 1 tablet by mouth at bedtime. 03/04/16  Yes [provider]  valACYclovir (VALTREX) 500 MG tablet Take 1 tablet by mouth daily. 03/30/16  Yes [provider]  diclofenac (VOLTAREN) 75 MG EC tablet Take 1 tablet by mouth daily as needed for  pain. 03/07/16   [provider]  oxyCODONE-acetaminophen (PERCOCET/ROXICET) 5-325 MG tablet Take 1 tablet by mouth 3 (three) times daily as needed for pain. 03/29/16   [provider]    Family History History reviewed. No pertinent family history.  Social History Social History  Substance Use Topics  . Smoking status: Former Research scientist (life sciences)  . Smokeless tobacco: Never Used  . Alcohol use Yes     Comment: "1-2 beers and maybe a mixed drink a day"     Allergies   Patient has no known allergies.   Review of Systems Review of Systems  All other systems reviewed and are negative.    Physical Exam Updated Vital Signs BP 114/70   Pulse 85   Temp 98.3 F (36.8 C) (Oral)   Resp 14   Ht 6' (1.829 m)   Wt 102.1 kg (225 lb)   SpO2 94%   BMI 30.52 kg/m   Physical Exam  Constitutional: He is oriented to person, place, and time. He appears well-developed and well-nourished.  HENT:  Head: Normocephalic and atraumatic.  Eyes: EOM are normal.  Neck: Normal range of motion.  Cardiovascular: Intact distal pulses.   Tachycardic rate.  Irregularly irregular rhythm  Pulmonary/Chest: Effort normal and breath sounds normal. No respiratory distress.  Abdominal: Soft. He exhibits no distension. There is no tenderness.  Musculoskeletal: Normal range of motion.  Neurological: He is alert  and oriented to person, place, and time.  Skin: Skin is warm and dry.  Psychiatric: He has a normal mood and affect. Judgment normal.  Nursing note and vitals reviewed.    ED Treatments / Results  Labs (all labs ordered are listed, but only abnormal results are displayed) Labs Reviewed  BASIC METABOLIC PANEL - Abnormal; Notable for the following:       Result Value   Glucose, Bld 100 (*)    All other components within normal limits  CBC WITH DIFFERENTIAL/PLATELET  TSH  I-STAT TROPONIN, ED    EKG  EKG Interpretation #1  Date/Time:  Tuesday January 27 2017 12:23:09 EDT Ventricular  Rate:  120 PR Interval:    QRS Duration: 86 QT Interval:  283 QTC Calculation: 400 R Axis:   51 Text Interpretation:  Atrial fibrillation Borderline repolarization abnormality afib new since prior tracing Confirmed by Jola Schmidt [phone]) on 01/27/2017 1:46:03 PM        ECG interpretation #2   Date: 01/27/2017  Rate: 77   Rhythm: normal sinus rhythm  QRS Axis: normal  Intervals: normal  ST/T Wave abnormalities: normal  Conduction Disutrbances: none  Narrative Interpretation:   Old EKG Reviewed: afib resolved as compared to ecg earlier in ER visit    Radiology Dg Chest 2 View  Result Date: 01/27/2017 CLINICAL DATA:  Tachycardia, shortness of breath, and chest discomfort. EXAM: CHEST  2 VIEW COMPARISON:  PA and lateral chest x-ray of April 23, 2016 FINDINGS: The lungs are adequately inflated. The interstitial markings are slightly more conspicuous than on the previous study. There is no alveolar infiltrate or pleural effusion. The heart and pulmonary vascularity are normal. The mediastinum is normal in width. The bony thorax exhibits no acute abnormality. IMPRESSION: Mildly increased interstitial markings may reflect noncardiac pulmonary edema or early interstitial pneumonia. There is no alveolar pneumonia nor other acute cardiopulmonary abnormality. Electronically Signed   By: David  Martinique M.D.   On: 01/27/2017 13:30    Procedures .Critical Care Performed by: Jola Schmidt Authorized by: Jola Schmidt     Total critical care time: 33 minutes Critical care time was exclusive of separately billable procedures and treating other patients. Critical care was necessary to treat or prevent imminent or life-threatening deterioration. Critical care was time spent personally by me on the following activities: development of treatment plan with patient and/or surrogate as well as nursing, discussions with consultants, evaluation of patient's response to treatment, examination of  patient, obtaining history from patient or surrogate, ordering and performing treatments and interventions, ordering and review of laboratory studies, ordering and review of radiographic studies, pulse oximetry and re-evaluation of patient's condition.  ++++++++++++++++++++++++++++++++++++++++++++++   Medications Ordered in ED Medications  diltiazem (CARDIZEM) 1 mg/mL load via infusion 20 mg (20 mg Intravenous Bolus from Bag 01/27/17 1240)    And  diltiazem (CARDIZEM) 100 mg in dextrose 5 % 100 mL (1 mg/mL) infusion (5 mg/hr Intravenous New Bag/Given 01/27/17 1240)  diltiazem (CARDIZEM CD) 24 hr capsule 120 mg (120 mg Oral Given 01/27/17 1359)     Initial Impression / Assessment and Plan / ED Course  I have reviewed the triage vital signs and the nursing notes.  Pertinent labs & imaging results that were available during my care of the patient were reviewed by me and considered in my medical decision making (see chart for details).   Presenting with active palpitations and chest pain. afib with RVR, highest rate noted to be 144. IV cardizem bolus and  drip now. Will follow closely while in ER receiving continuous infusion    CHA2DS2/VAS Stroke Risk Points  = 1  >= 2 Points: High Risk  1 - 1.99 Points: Medium Risk  0 Points: Low Risk   This score determines the patient's risk of having a stroke if the  patient has atrial fibrillation.    1:48 PM Converted to sinus rhythm. Will stop IV cardizem and switch to oral cardizem. Will plan on 4 weeks of anticoagulation with eliquis. Cardiology/afib follow up. Will recommend decreasing ETOH consumption. Plan on home with oral cardizem. pcp follow up as well      Final Clinical Impressions(s) / ED Diagnoses   Final diagnoses:  None    New Prescriptions New Prescriptions   No medications on file     Jola Schmidt, MD 01/27/17 1411    Jola Schmidt, MD 01/27/17 1426

## 2017-01-29 ENCOUNTER — Telehealth (HOSPITAL_COMMUNITY): Payer: Self-pay | Admitting: *Deleted

## 2017-01-29 NOTE — Telephone Encounter (Signed)
Pt referred to afib clinic by ED.  vcml not set up, Pt cld back and scheduled

## 2017-01-30 ENCOUNTER — Encounter (HOSPITAL_COMMUNITY): Payer: Self-pay | Admitting: Nurse Practitioner

## 2017-01-30 ENCOUNTER — Ambulatory Visit (HOSPITAL_COMMUNITY)
Admission: RE | Admit: 2017-01-30 | Discharge: 2017-01-30 | Disposition: A | Discharge status: Home / Self Care | Payer: 59 | Source: Ambulatory Visit | Attending: Nurse Practitioner | Admitting: Nurse Practitioner

## 2017-01-30 ENCOUNTER — Telehealth: Payer: Self-pay | Admitting: *Deleted

## 2017-01-30 VITALS — BP 110/80 | HR 75 | Ht 72.0 in | Wt 231.6 lb

## 2017-01-30 DIAGNOSIS — Z7902 Long term (current) use of antithrombotics/antiplatelets: Secondary | ICD-10-CM | POA: Insufficient documentation

## 2017-01-30 DIAGNOSIS — E7800 Pure hypercholesterolemia, unspecified: Secondary | ICD-10-CM | POA: Diagnosis not present

## 2017-01-30 DIAGNOSIS — I4891 Unspecified atrial fibrillation: Secondary | ICD-10-CM

## 2017-01-30 DIAGNOSIS — Z87891 Personal history of nicotine dependence: Secondary | ICD-10-CM | POA: Diagnosis not present

## 2017-01-30 DIAGNOSIS — I10 Essential (primary) hypertension: Secondary | ICD-10-CM | POA: Diagnosis not present

## 2017-01-30 DIAGNOSIS — I480 Paroxysmal atrial fibrillation: Secondary | ICD-10-CM | POA: Diagnosis not present

## 2017-01-30 MED ORDER — AMLODIPINE BESYLATE 5 MG PO TABS: 2.5000 mg | ORAL_TABLET | Freq: Every day | ORAL | 2 refills | Status: DC

## 2017-01-30 MED ORDER — SIMVASTATIN 10 MG PO TABS: 10.0000 mg | ORAL_TABLET | Freq: Every day | ORAL | 2 refills | Status: DC

## 2017-01-30 MED ORDER — DILTIAZEM HCL ER COATED BEADS 120 MG PO CP24: 120.0000 mg | ORAL_CAPSULE | Freq: Every day | ORAL | 2 refills | Status: DC

## 2017-01-30 NOTE — Telephone Encounter (Signed)
-----   Message from Juluis Mire, RN sent at 01/30/2017  9:58 AM EDT ----- Regarding: sleep study Pt needs sleep study for afib.  Thanks William Kim

## 2017-01-30 NOTE — Telephone Encounter (Deleted)
-----   Message from Juluis Mire, RN sent at 01/30/2017  9:58 AM EDT ----- Regarding: sleep study Pt needs sleep study for afib.  Thanks stacy

## 2017-01-30 NOTE — Progress Notes (Signed)
Primary Care Physician: Redmond School, MD Referring Physician: [facility] ER f/u   William Kim is a 51 y.o. male with a h/o HTN, alcohol use and untreated probable sleep apnea. It is documented in Epic in 2014 that he was treated for afib in the ER. The pt reports that he would have issues with irregular heart beat off and on since then but episodes would be short lived. He noticed the episodes were more frequent over the last few weeks and recently present to the ER, 7/31, with afib with RVR.He was given IV diltiazem and converted. Discharged home on xarelto 20 mg for a chadsvasc score of 1 and daily diltiazem 120 mg a day.  In the afib clinic, 8/3, He is in [place]. No further afib. He was drinking 6 alcoholic drinks a night and was told in the ER to diminish alcohol intake. He also reports significant snoring/ apnea and about 8 years ago, had a sleep study but when the cpap mask was applied, he did not like the feeling and left the test. 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 He denies tobacco or caffeine use.  Today, he denies symptoms of palpitations, chest pain, shortness of breath, orthopnea, PND, lower extremity edema, dizziness, presyncope, syncope, or neurologic sequela. The patient is tolerating medications without difficulties and is otherwise without complaint today.   Past Medical History:  Diagnosis Date  . High cholesterol   . Hypertension    No past surgical history on file.  Current Outpatient Prescriptions  Medication Sig Dispense Refill  . amLODipine (NORVASC) 5 MG tablet Take 0.5 tablets (2.5 mg total) by mouth daily. 30 tablet 2  . apixaban (ELIQUIS STARTER PACK) 5 MG TABS tablet Take 1 tablet (5 mg total) by mouth 2 (two) times daily. 60 tablet 0  . Ascorbic Acid (VITAMIN C PO) Take 1 tablet by mouth daily.    . Cranberry 1000 MG CAPS Take 1 capsule by mouth daily.    Marland Kitchen diltiazem (CARDIZEM CD) 120 MG 24 hr capsule Take 1 capsule (120 mg total) by mouth daily. 90  capsule 2  . Omega-3 Fatty Acids (FISH OIL PO) Take 1 capsule by mouth at bedtime.    . simvastatin (ZOCOR) 10 MG tablet Take 1 tablet (10 mg total) by mouth at bedtime. 90 tablet 2  . valACYclovir (VALTREX) 500 MG tablet Take 1 tablet by mouth daily.     No current facility-administered medications for this encounter.     No Known Allergies  Social History   Social History  . Marital status: Married    Spouse name: N/A  . Number of children: N/A  . Years of education: N/A   Occupational History  . Not on file.   Social History Main Topics  . Smoking status: Former Research scientist (life sciences)  . Smokeless tobacco: Never Used  . Alcohol use Yes     Comment: "1-2 beers and maybe a mixed drink a day"  . Drug use: No  . Sexual activity: Not on file   Other Topics Concern  . Not on file   Social History Narrative  . No narrative on file    No family history on file.  ROS- All systems are reviewed and negative except as per the HPI above  Physical Exam: Vitals:   01/30/17 0922  BP: 110/80  Pulse: 75  Weight: 231 lb 9.6 oz (105.1 kg)  Height: 6' (1.829 m)   Wt Readings from Last 3 Encounters:  01/30/17  231 lb 9.6 oz (105.1 kg)  01/27/17 225 lb (102.1 kg)  04/23/16 215 lb (97.5 kg)    Labs: Lab Results  Component Value Date   NA 140 01/27/2017   K 3.8 01/27/2017   CL 108 01/27/2017   CO2 25 01/27/2017   GLUCOSE 100 (H) 01/27/2017   BUN 16 01/27/2017   CREATININE 0.94 01/27/2017   CALCIUM 9.3 01/27/2017   No results found for: INR No results found for: CHOL, HDL, LDLCALC, TRIG   GEN- The patient is well appearing, alert and oriented x 3 today.   Head- normocephalic, atraumatic Eyes-  Sclera clear, conjunctiva pink Ears- hearing intact Oropharynx- clear Neck- supple, no JVP Lymph- no cervical lymphadenopathy Lungs- Clear to ausculation bilaterally, normal work of breathing Heart- Regular rate and rhythm, no murmurs, rubs or gallops, PMI not laterally displaced GI-  soft, NT, ND, + BS Extremities- no clubbing, cyanosis, or edema MS- no significant deformity or atrophy Skin- no rash or lesion Psych- euthymic mood, full affect Neuro- strength and sensation are intact  EKG- NSR at 75 bpm, pr int 170 ms, qtc 413 ms Epic records reviewed   Assessment and Plan: 1. Paroxysmal afib General education re afib Continue diltiazem Continue xarelto for now with a chadsvasc score of 1 but probably won't need long term Bleeding  precautions dicussed Echo  2. HTN Well controlled today Will cut amlodipine in half and if BP stays controlled will stop He will continue diltiazem which will treat BP as well He will decrease simvastatin to 10 mg with CCB  3. Lifestyle measures Decrease alcohol to no more than 2 drinks a week Sleep study ordered for snoring/ apnea Regular exercise Weight loss of 5-10% encouraged  F/u in 3 weeks  Butch Penny C. Albertus Chiarelli, [facility] [address] [place], [place] [address] [phone]

## 2017-01-30 NOTE — Patient Instructions (Signed)
Your physician has recommended you make the following change in your medication:  1)Decrease Amlodipine to 2.5mg  a day (1/2 tablet of the 5mg  tab)

## 2017-02-02 ENCOUNTER — Ambulatory Visit (HOSPITAL_COMMUNITY)
Admission: RE | Admit: 2017-02-02 | Discharge: 2017-02-02 | Disposition: A | Discharge status: Home / Self Care | Payer: 59 | Source: Ambulatory Visit | Attending: Nurse Practitioner | Admitting: Nurse Practitioner

## 2017-02-02 DIAGNOSIS — I340 Nonrheumatic mitral (valve) insufficiency: Secondary | ICD-10-CM | POA: Diagnosis not present

## 2017-02-02 DIAGNOSIS — I480 Paroxysmal atrial fibrillation: Secondary | ICD-10-CM

## 2017-02-02 NOTE — Progress Notes (Signed)
  Echocardiogram 2D Echocardiogram has been performed.  William Kim 02/02/2017, 2:49 PM

## 2017-02-03 ENCOUNTER — Encounter: Payer: Self-pay | Admitting: Cardiovascular Disease

## 2017-02-03 NOTE — Telephone Encounter (Signed)
Informed patient of upcoming sleep study and patient understanding was verbalized. Patient understands his sleep study is scheduled for Sunday March 29 2017. Patient understands his sleep study will be done at [facility] sleep lab. Patient understands he will receive a sleep packet in a week or so. Patient understands to call if he does not receive the sleep packet in a timely manner. Patient agrees with treatment and thanked me for call.

## 2017-02-11 ENCOUNTER — Encounter: Payer: Self-pay | Admitting: Cardiology

## 2017-02-23 ENCOUNTER — Encounter (HOSPITAL_COMMUNITY): Payer: Self-pay | Admitting: Nurse Practitioner

## 2017-02-23 ENCOUNTER — Ambulatory Visit (HOSPITAL_COMMUNITY)
Admission: RE | Admit: 2017-02-23 | Discharge: 2017-02-23 | Disposition: A | Discharge status: Home / Self Care | Payer: 59 | Source: Ambulatory Visit | Attending: Nurse Practitioner | Admitting: Nurse Practitioner

## 2017-02-23 VITALS — BP 120/86 | HR 80 | Ht 72.0 in | Wt 231.0 lb

## 2017-02-23 DIAGNOSIS — I480 Paroxysmal atrial fibrillation: Secondary | ICD-10-CM | POA: Insufficient documentation

## 2017-02-23 DIAGNOSIS — Z87891 Personal history of nicotine dependence: Secondary | ICD-10-CM | POA: Diagnosis not present

## 2017-02-23 DIAGNOSIS — Z79899 Other long term (current) drug therapy: Secondary | ICD-10-CM | POA: Diagnosis not present

## 2017-02-23 DIAGNOSIS — I10 Essential (primary) hypertension: Secondary | ICD-10-CM | POA: Diagnosis not present

## 2017-02-23 DIAGNOSIS — E7800 Pure hypercholesterolemia, unspecified: Secondary | ICD-10-CM | POA: Insufficient documentation

## 2017-02-23 DIAGNOSIS — Z7289 Other problems related to lifestyle: Secondary | ICD-10-CM | POA: Diagnosis not present

## 2017-02-23 DIAGNOSIS — R0683 Snoring: Secondary | ICD-10-CM | POA: Diagnosis not present

## 2017-02-23 NOTE — Patient Instructions (Addendum)
Your physician has recommended you make the following change in your medication:  1)May stop eliquis when you finish your current prescription and then start a low dose aspirin once a day

## 2017-02-23 NOTE — Progress Notes (Signed)
Primary Care Physician: Redmond School, MD Referring Physician: [facility] ER f/u   William Kim is a 51 y.o. male with a h/o HTN, alcohol use and untreated probable sleep apnea. It is documented in Epic in 2014 that he was treated for afib in the ER. The pt reports that he would have issues with irregular heart beat off and on since then but episodes would be short lived. He noticed the episodes were more frequent over the last few weeks and recently present to the ER, 7/31, with afib with RVR.He was given IV diltiazem and converted. Discharged home on xarelto 20 mg for a chadsvasc score of 1 and daily diltiazem 120 mg a day.  In the afib clinic, 8/3, He is in [place]. No further afib. He was drinking 6 alcoholic drinks a night and was told in the ER to diminish alcohol intake. He also reports significant snoring/ apnea and about 8 years ago, had a sleep study but when the cpap mask was applied, he did not like the feeling and left the test. 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 He denies tobacco or caffeine use.  F/u in afib clinic 8/27. He is in [place]. Has noted a few short lived episodes of irregular heart beat. He will finish xarelto for a chadsvasc score of 1(htn). Echo showed Mild LVH. He has tolerated addition of diltiazem daily. Sleep study is pending. He has cut back on alcohol but has not had cessation.  Today, he denies symptoms of palpitations, chest pain, shortness of breath, orthopnea, PND, lower extremity edema, dizziness, presyncope, syncope, or neurologic sequela. The patient is tolerating medications without difficulties and is otherwise without complaint today.   Past Medical History:  Diagnosis Date  . High cholesterol   . Hypertension    No past surgical history on file.  Current Outpatient Prescriptions  Medication Sig Dispense Refill  . amLODipine (NORVASC) 5 MG tablet Take 0.5 tablets (2.5 mg total) by mouth daily. 30 tablet 2  . apixaban (ELIQUIS STARTER PACK) 5 MG  TABS tablet Take 1 tablet (5 mg total) by mouth 2 (two) times daily. 60 tablet 0  . Ascorbic Acid (VITAMIN C PO) Take 1 tablet by mouth daily.    . Cranberry 1000 MG CAPS Take 1 capsule by mouth daily.    Marland Kitchen diltiazem (CARDIZEM CD) 120 MG 24 hr capsule Take 1 capsule (120 mg total) by mouth daily. 90 capsule 2  . Omega-3 Fatty Acids (FISH OIL PO) Take 1 capsule by mouth at bedtime.    . simvastatin (ZOCOR) 10 MG tablet Take 1 tablet (10 mg total) by mouth at bedtime. 90 tablet 2  . valACYclovir (VALTREX) 500 MG tablet Take 1 tablet by mouth daily.     No current facility-administered medications for this encounter.     No Known Allergies  Social History   Social History  . Marital status: Married    Spouse name: N/A  . Number of children: N/A  . Years of education: N/A   Occupational History  . Not on file.   Social History Main Topics  . Smoking status: Former Research scientist (life sciences)  . Smokeless tobacco: Never Used  . Alcohol use Yes     Comment: "1-2 beers and maybe a mixed drink a day"  . Drug use: No  . Sexual activity: Not on file   Other Topics Concern  . Not on file   Social History Narrative  . No narrative on file  No family history on file.  ROS- All systems are reviewed and negative except as per the HPI above  Physical Exam: Vitals:   02/23/17 0858  BP: 120/86  Pulse: 80  Weight: 231 lb (104.8 kg)  Height: 6' (1.829 m)   Wt Readings from Last 3 Encounters:  02/23/17 231 lb (104.8 kg)  01/30/17 231 lb 9.6 oz (105.1 kg)  01/27/17 225 lb (102.1 kg)    Labs: Lab Results  Component Value Date   NA 140 01/27/2017   K 3.8 01/27/2017   CL 108 01/27/2017   CO2 25 01/27/2017   GLUCOSE 100 (H) 01/27/2017   BUN 16 01/27/2017   CREATININE 0.94 01/27/2017   CALCIUM 9.3 01/27/2017   No results found for: INR No results found for: CHOL, HDL, LDLCALC, TRIG   GEN- The patient is well appearing, alert and oriented x 3 today.   Head- normocephalic, atraumatic Eyes-   Sclera clear, conjunctiva pink Ears- hearing intact Oropharynx- clear Neck- supple, no JVP Lymph- no cervical lymphadenopathy Lungs- Clear to ausculation bilaterally, normal work of breathing Heart- Regular rate and rhythm, no murmurs, rubs or gallops, PMI not laterally displaced GI- soft, NT, ND, + BS Extremities- no clubbing, cyanosis, or edema MS- no significant deformity or atrophy Skin- no rash or lesion Psych- euthymic mood, full affect Neuro- strength and sensation are intact  EKG- NSR at 80 bpm, pr int 166 ms, qrs int, 78 ms, qtc 415 ms Epic records reviewed Echo-Study Conclusions  - Left ventricle: The cavity size was normal. Wall thickness was   increased in a pattern of mild LVH. Systolic function was normal.   The estimated ejection fraction was in the range of 60% to 65%.   Low normal GLPSS at -18%. Wall motion was normal; there were no   regional wall motion abnormalities. Doppler parameters are   consistent with abnormal left ventricular relaxation (grade 1   diastolic dysfunction). The E/e&' ratio is between 8-15,   suggesting indeterminate LV filling pressure. - Mitral valve: Mildly thickened leaflets . There was trivial   regurgitation. - Left atrium: The atrium was normal in size. - Inferior vena cava: The vessel was normal in size. The   respirophasic diameter changes were in the normal range (>= 50%),   consistent with normal central venous pressure.  Impressions:  - LVEF 60-65%, mild LVH, normal wall thickness, normal wall motion,   grade 1 DD, indeterminate LV filling pressure, normal LA Size,   normal IVC.   Assessment and Plan: 1. Paroxysmal afib General education re afib Continue diltiazem 120 mg a day Stop xarelto when 30 day RX finished with a chadsvasc score of 1, can start 81 mg asa after xarelto stopped  Echo reviewed with pt  2. HTN Well controlled today Will cut amlodipine in half and if BP stays controlled will stop on f/u  visit He will continue diltiazem    3. Lifestyle measures Decrease alcohol to no more than 2 drinks a week Sleep study pending Regular exercise Weight loss of 5-10% encouraged  F/u in 6 weeks  Butch Penny C. Derrill Bagnell, [facility] [address] [place], [place] [address] [phone]

## 2017-03-13 ENCOUNTER — Other Ambulatory Visit (HOSPITAL_COMMUNITY): Payer: Self-pay | Admitting: *Deleted

## 2017-03-13 MED ORDER — AMLODIPINE BESYLATE 5 MG PO TABS: 2.5000 mg | ORAL_TABLET | Freq: Every day | ORAL | 1 refills | Status: DC

## 2017-03-29 ENCOUNTER — Ambulatory Visit (HOSPITAL_BASED_OUTPATIENT_CLINIC_OR_DEPARTMENT_OTHER): Payer: 59 | Attending: Nurse Practitioner | Admitting: Cardiology

## 2017-03-29 VITALS — Ht 72.0 in | Wt 229.0 lb

## 2017-03-29 DIAGNOSIS — G4733 Obstructive sleep apnea (adult) (pediatric): Secondary | ICD-10-CM | POA: Insufficient documentation

## 2017-03-29 DIAGNOSIS — I491 Atrial premature depolarization: Secondary | ICD-10-CM | POA: Insufficient documentation

## 2017-03-29 DIAGNOSIS — R5383 Other fatigue: Secondary | ICD-10-CM | POA: Insufficient documentation

## 2017-03-29 DIAGNOSIS — I4891 Unspecified atrial fibrillation: Secondary | ICD-10-CM

## 2017-04-01 ENCOUNTER — Telehealth: Payer: Self-pay | Admitting: *Deleted

## 2017-04-01 NOTE — Telephone Encounter (Signed)
Hello, I don't have the results from Dr Radford Pax at this time. If we get the results in Dr Radford Pax should be able to. Thanks, Gae Bon

## 2017-04-01 NOTE — Telephone Encounter (Signed)
-----   Message from Juluis Mire, RN sent at 04/01/2017  9:29 AM EDT ----- Regarding: sleep study Pt had sleep study last week -- called needing results finalized so he can send to the [place]. Hes coming to our office for visit Friday - do you think will be finalized by then? Thanks [employer]

## 2017-04-03 ENCOUNTER — Encounter (HOSPITAL_COMMUNITY): Payer: Self-pay | Admitting: Nurse Practitioner

## 2017-04-03 ENCOUNTER — Ambulatory Visit (HOSPITAL_COMMUNITY)
Admission: RE | Admit: 2017-04-03 | Discharge: 2017-04-03 | Disposition: A | Discharge status: Home / Self Care | Payer: 59 | Source: Ambulatory Visit | Attending: Nurse Practitioner | Admitting: Nurse Practitioner

## 2017-04-03 VITALS — BP 126/84 | HR 73 | Ht 72.0 in | Wt 235.6 lb

## 2017-04-03 DIAGNOSIS — Z7901 Long term (current) use of anticoagulants: Secondary | ICD-10-CM | POA: Insufficient documentation

## 2017-04-03 DIAGNOSIS — I10 Essential (primary) hypertension: Secondary | ICD-10-CM | POA: Insufficient documentation

## 2017-04-03 DIAGNOSIS — I4891 Unspecified atrial fibrillation: Secondary | ICD-10-CM | POA: Diagnosis present

## 2017-04-03 DIAGNOSIS — Z79899 Other long term (current) drug therapy: Secondary | ICD-10-CM | POA: Diagnosis not present

## 2017-04-03 DIAGNOSIS — E7800 Pure hypercholesterolemia, unspecified: Secondary | ICD-10-CM | POA: Insufficient documentation

## 2017-04-03 DIAGNOSIS — Z87891 Personal history of nicotine dependence: Secondary | ICD-10-CM | POA: Diagnosis not present

## 2017-04-03 DIAGNOSIS — I480 Paroxysmal atrial fibrillation: Secondary | ICD-10-CM | POA: Insufficient documentation

## 2017-04-03 MED ORDER — DILTIAZEM HCL ER COATED BEADS 120 MG PO CP24: 120.0000 mg | ORAL_CAPSULE | Freq: Two times a day (BID) | ORAL | 2 refills | Status: DC

## 2017-04-03 NOTE — Progress Notes (Signed)
Primary Care Physician: Redmond School, MD Referring Physician: [facility] ER f/u   William Kim is a 51 y.o. male with a h/o HTN, alcohol use and untreated probable sleep apnea. It is documented in Epic in 2014 that he was treated for afib in the ER. The pt reports that he would have issues with irregular heart beat off and on since then but episodes would be short lived. He noticed the episodes were more frequent over the last few weeks and recently present to the ER, 7/31, with afib with RVR.He was given IV diltiazem and converted. Discharged home on xarelto 20 mg for a chadsvasc score of 1 and daily diltiazem 120 mg a day.  In the afib clinic, 8/3, He is in [place]. No further afib. He was drinking 6 alcoholic drinks a night and was told in the ER to diminish alcohol intake. He also reports significant snoring/ apnea and about 8 years ago, had a sleep study but when the cpap mask was applied, he did not like the feeling and left the test. 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 He denies tobacco or caffeine use.  F/u in afib clinic 8/27. He is in [place]. Has noted a few short lived episodes of irregular heart beat. He will finish xarelto for a chadsvasc score of 1(htn). Echo showed Mild LVH. He has tolerated addition of diltiazem daily. Sleep study is pending. He has cut back on alcohol but has not had cessation.  F/u afib clinic, he had repeated sleep study and is pending results of test. Still  notices some episodes of afib lasting around 15-20 mins. Still has cut down on alcohol but two nights ago had 2 beers and a margarita. He is exercising more and has lost 4 lbs. He is now off anticoagulation.  Today, he denies symptoms of palpitations, chest pain, shortness of breath, orthopnea, PND, lower extremity edema, dizziness, presyncope, syncope, or neurologic sequela. The patient is tolerating medications without difficulties and is otherwise without complaint today.   Past Medical History:    Diagnosis Date  . High cholesterol   . Hypertension    No past surgical history on file.  Current Outpatient Prescriptions  Medication Sig Dispense Refill  . Ascorbic Acid (VITAMIN C PO) Take 1 tablet by mouth daily.    . Cranberry 1000 MG CAPS Take 1 capsule by mouth daily.    Marland Kitchen diltiazem (CARDIZEM CD) 120 MG 24 hr capsule Take 1 capsule (120 mg total) by mouth 2 (two) times daily. 180 capsule 2  . Omega-3 Fatty Acids (FISH OIL PO) Take 1 capsule by mouth 2 (two) times daily.     . simvastatin (ZOCOR) 10 MG tablet Take 1 tablet (10 mg total) by mouth at bedtime. 90 tablet 2  . valACYclovir (VALTREX) 500 MG tablet Take 1 tablet by mouth daily.     No current facility-administered medications for this encounter.     No Known Allergies  Social History   Social History  . Marital status: Married    Spouse name: N/A  . Number of children: N/A  . Years of education: N/A   Occupational History  . Not on file.   Social History Main Topics  . Smoking status: Former Research scientist (life sciences)  . Smokeless tobacco: Never Used  . Alcohol use Yes     Comment: "1-2 beers and maybe a mixed drink a day"  . Drug use: No  . Sexual activity: Not on file   Other Topics  Concern  . Not on file   Social History Narrative  . No narrative on file    No family history on file.  ROS- All systems are reviewed and negative except as per the HPI above  Physical Exam: Vitals:   04/03/17 0901  BP: 126/84  Pulse: 73  Weight: 235 lb 9.6 oz (106.9 kg)  Height: 6' (1.829 m)   Wt Readings from Last 3 Encounters:  04/03/17 235 lb 9.6 oz (106.9 kg)  03/29/17 229 lb (103.9 kg)  02/23/17 231 lb (104.8 kg)    Labs: Lab Results  Component Value Date   NA 140 01/27/2017   K 3.8 01/27/2017   CL 108 01/27/2017   CO2 25 01/27/2017   GLUCOSE 100 (H) 01/27/2017   BUN 16 01/27/2017   CREATININE 0.94 01/27/2017   CALCIUM 9.3 01/27/2017   No results found for: INR No results found for: CHOL, HDL, LDLCALC,  TRIG   GEN- The patient is well appearing, alert and oriented x 3 today.   Head- normocephalic, atraumatic Eyes-  Sclera clear, conjunctiva pink Ears- hearing intact Oropharynx- clear Neck- supple, no JVP Lymph- no cervical lymphadenopathy Lungs- Clear to ausculation bilaterally, normal work of breathing Heart- Regular rate and rhythm, no murmurs, rubs or gallops, PMI not laterally displaced GI- soft, NT, ND, + BS Extremities- no clubbing, cyanosis, or edema MS- no significant deformity or atrophy Skin- no rash or lesion Psych- euthymic mood, full affect Neuro- strength and sensation are intact  EKG- NSR at 73 bpm, pr int 170 ms, qrs int, 80 ms, qtc 412 ms Epic records reviewed Echo-Study Conclusions  - Left ventricle: The cavity size was normal. Wall thickness was   increased in a pattern of mild LVH. Systolic function was normal.   The estimated ejection fraction was in the range of 60% to 65%.   Low normal GLPSS at -18%. Wall motion was normal; there were no   regional wall motion abnormalities. Doppler parameters are   consistent with abnormal left ventricular relaxation (grade 1   diastolic dysfunction). The E/e&' ratio is between 8-15,   suggesting indeterminate LV filling pressure. - Mitral valve: Mildly thickened leaflets . There was trivial   regurgitation. - Left atrium: The atrium was normal in size. - Inferior vena cava: The vessel was normal in size. The   respirophasic diameter changes were in the normal range (>= 50%),   consistent with normal central venous pressure.  Impressions:  - LVEF 60-65%, mild LVH, normal wall thickness, normal wall motion,   grade 1 DD, indeterminate LV filling pressure, normal LA Size,   normal IVC.   Assessment and Plan: 1. Paroxysmal afib General education re afib Continue diltiazem 120 mg a day but increase to bid Off xarelto, with a chadsvasc score of 1, can start 81 mg asa after xarelto stopped  2. HTN Well  controlled today Stop amlodipine with increase of cardizem   3. Lifestyle measures Decrease alcohol to no more than 2 drinks a week Sleep study results pending,[facility] Encouraged to continue exercise and weight loss efforts   F/u in 6 weeks  Butch Penny C. Noboru Bidinger, [facility] [address] [place], [place] [address] [phone]

## 2017-04-03 NOTE — Patient Instructions (Signed)
Your physician has recommended you make the following change in your medication:  1)Stop amlodipine 2)Start Cardizem to 120mg  twice a day

## 2017-04-06 ENCOUNTER — Ambulatory Visit (HOSPITAL_COMMUNITY): Payer: 59 | Admitting: Nurse Practitioner

## 2017-04-14 NOTE — Progress Notes (Signed)
Patient Name: William Kim, William Kim Date: 03/29/2017 Gender: Male D.O.B: Dec 19, 1965 Age (years): 51 Referring Provider: Sherran Needs Height (inches): 42 Interpreting Physician: Fransico Him MD, ABSM Weight (lbs): 229 RPSGT: Zadie Rhine BMI: 31 MRN: [id number] Neck Size: 17.50  CLINICAL INFORMATION Sleep Study Type: Split Night CPAP  Indication for sleep study: Fatigue, OSA, Snoring  Epworth Sleepiness Score: 20  SLEEP STUDY TECHNIQUE As per the AASM Manual for the Scoring of Sleep and Associated Events v2.3 (April 2016) with a hypopnea requiring 4% desaturations.  The channels recorded and monitored were frontal, central and occipital EEG, electrooculogram (EOG), submentalis EMG (chin), nasal and oral airflow, thoracic and abdominal wall motion, anterior tibialis EMG, snore microphone, electrocardiogram, and pulse oximetry. Continuous positive airway pressure (CPAP) was initiated when the patient met split night criteria and was titrated according to treat sleep-disordered breathing.  MEDICATIONS Medications self-administered by patient taken the night of the study : N/A  RESPIRATORY PARAMETERS Diagnostic Total AHI (/hr): 79.0  RDI (/hr):79.0  OA Index (/hr): 44.9  CA Index (/hr): 0.0 REM AHI (/hr): 65.9  NREM AHI (/hr):82.5  Supine AHI (/hr):N/A  Non-supine AHI (/hr):79.02 Min O2 Sat (%):58.00  Mean O2 (%): 88.25  Time below 88% (min):49.5    Titration Optimal Pressure (cm):19  AHI at Optimal Pressure (/hr):0.0  Min O2 at Optimal Pressure (%):93.0 Supine % at Optimal (%):100  Sleep % at Optimal (%):100    SLEEP ARCHITECTURE The recording time for the entire night was 444.1 minutes.  During a baseline period of 177.4 minutes, the patient slept for 123.0 minutes in REM and nonREM, yielding a sleep efficiency of 69.3%. Sleep onset after lights out was 45.8 minutes with a REM latency of 101.5 minutes. The patient spent 5.69% of the night in stage N1  sleep, 73.58% in stage N2 sleep, 0.00% in stage N3 and 20.73% in REM.  During the titration period of 260.1 minutes, the patient slept for 159.9 minutes in REM and nonREM, yielding a sleep efficiency of 61.5%. Sleep onset after CPAP initiation was 36.7 minutes with a REM latency of 39.5 minutes. The patient spent 11.26% of the night in stage N1 sleep, 39.41% in stage N2 sleep, 0.00% in stage N3 and 49.33% in REM.  CARDIAC DATA The 2 lead EKG demonstrated sinus rhythm. The mean heart rate was 65.73 beats per minute. Other EKG findings include: PACs.  LEG MOVEMENT DATA The total Periodic Limb Movements of Sleep (PLMS) were 0. The PLMS index was 0.00 .  IMPRESSIONS - Severe obstructive sleep apnea occurred during the diagnostic portion of the study (AHI = 79.0/hour). An optimal PAP pressure was selected for this patient ( 19 cm of water) - No significant central sleep apnea occurred during the diagnostic portion of the study (CAI = 0.0/hour). - Moderate oxygen desaturation was noted during the diagnostic portion of the study (Min O2 =58.00%). - No snoring was audible during the diagnostic portion of the study. - PACs were noted during this study. - Clinically significant periodic limb movements did not occur during sleep.   DIAGNOSIS - Obstructive Sleep Apnea (327.23 [G47.33 ICD-10])  RECOMMENDATIONS - Trial of CPAP therapy on 19 cm H2O with a Medium size Resmed Full Face Mask AirFit F20 mask and heated humidification. - Avoid alcohol, sedatives and other CNS depressants that may worsen sleep apnea and disrupt normal sleep architecture. - Sleep hygiene should be reviewed to assess factors that may improve sleep quality. - Weight management and regular exercise should be initiated  or continued. - Return to Sleep Center for re-evaluation after 10 weeks of therapy  [place], [place] [facility]  ELECTRONICALLY SIGNED ON:  04/14/2017, 9:39 PM [place] PH: [phone]   FX: [phone] [place]

## 2017-04-14 NOTE — Procedures (Signed)
Patient Name: William Kim, William Kim Date: 03/29/2017 Gender: Male D.O.B: 1966-06-25 Age (years): 51 Referring Provider: Sherran Needs Height (inches): 86 Interpreting Physician: Fransico Him MD, ABSM Weight (lbs): 229 RPSGT: Zadie Rhine BMI: 31 MRN: [id number] Neck Size: 17.50  CLINICAL INFORMATION Sleep Study Type: Split Night CPAP  Indication for sleep study: Fatigue, OSA, Snoring  Epworth Sleepiness Score: 20  SLEEP STUDY TECHNIQUE As per the AASM Manual for the Scoring of Sleep and Associated Events v2.3 (April 2016) with a hypopnea requiring 4% desaturations.  The channels recorded and monitored were frontal, central and occipital EEG, electrooculogram (EOG), submentalis EMG (chin), nasal and oral airflow, thoracic and abdominal wall motion, anterior tibialis EMG, snore microphone, electrocardiogram, and pulse oximetry. Continuous positive airway pressure (CPAP) was initiated when the patient met split night criteria and was titrated according to treat sleep-disordered breathing.  MEDICATIONS Medications self-administered by patient taken the night of the study : N/A  RESPIRATORY PARAMETERS Diagnostic Total AHI (/hr): 79.0  RDI (/hr):79.0  OA Index (/hr): 44.9  CA Index (/hr): 0.0 REM AHI (/hr): 65.9  NREM AHI (/hr):82.5  Supine AHI (/hr):N/A  Non-supine AHI (/hr):79.02 Min O2 Sat (%):58.00  Mean O2 (%): 88.25  Time below 88% (min):49.5    Titration Optimal Pressure (cm):19  AHI at Optimal Pressure (/hr):0.0  Min O2 at Optimal Pressure (%):93.0 Supine % at Optimal (%):100  Sleep % at Optimal (%):100    SLEEP ARCHITECTURE The recording time for the entire night was 444.1 minutes.  During a baseline period of 177.4 minutes, the patient slept for 123.0 minutes in REM and nonREM, yielding a sleep efficiency of 69.3%. Sleep onset after lights out was 45.8 minutes with a REM latency of 101.5 minutes. The patient spent 5.69% of the night in stage N1 sleep,  73.58% in stage N2 sleep, 0.00% in stage N3 and 20.73% in REM.  During the titration period of 260.1 minutes, the patient slept for 159.9 minutes in REM and nonREM, yielding a sleep efficiency of 61.5%. Sleep onset after CPAP initiation was 36.7 minutes with a REM latency of 39.5 minutes. The patient spent 11.26% of the night in stage N1 sleep, 39.41% in stage N2 sleep, 0.00% in stage N3 and 49.33% in REM.  CARDIAC DATA The 2 lead EKG demonstrated sinus rhythm. The mean heart rate was 65.73 beats per minute. Other EKG findings include: PACs.  LEG MOVEMENT DATA The total Periodic Limb Movements of Sleep (PLMS) were 0. The PLMS index was 0.00 .  IMPRESSIONS - Severe obstructive sleep apnea occurred during the diagnostic portion of the study (AHI = 79.0/hour). An optimal PAP pressure was selected for this patient ( 19 cm of water) - No significant central sleep apnea occurred during the diagnostic portion of the study (CAI = 0.0/hour). - Moderate oxygen desaturation was noted during the diagnostic portion of the study (Min O2 =58.00%). - No snoring was audible during the diagnostic portion of the study. - PACs were noted during this study. - Clinically significant periodic limb movements did not occur during sleep.   DIAGNOSIS - Obstructive Sleep Apnea (327.23 [G47.33 ICD-10])  RECOMMENDATIONS - Trial of CPAP therapy on 19 cm H2O with a Medium size Resmed Full Face Mask AirFit F20 mask and heated humidification. - Avoid alcohol, sedatives and other CNS depressants that may worsen sleep apnea and disrupt normal sleep architecture. - Sleep hygiene should be reviewed to assess factors that may improve sleep quality. - Weight management and regular exercise should be initiated  or continued. - Return to Sleep Center for re-evaluation after 10 weeks of therapy  [place], [place] [facility]  ELECTRONICALLY SIGNED ON:  04/14/2017, 9:39 PM [place] PH: [phone]   FX: [phone] [place]

## 2017-04-17 ENCOUNTER — Telehealth: Payer: Self-pay | Admitting: *Deleted

## 2017-04-17 NOTE — Telephone Encounter (Signed)
Unable to leave a message no vm was set up.

## 2017-04-17 NOTE — Telephone Encounter (Signed)
-----   Message from William Margarita, MD sent at 04/14/2017  9:44 PM EDT ----- Please let patient know that they have significant sleep apnea and had successful CPAP titration and will be set up with CPAP unit.  Please let DME know that order is in EPIC.  Please set patient up for OV in 10 weeks

## 2017-04-22 NOTE — Telephone Encounter (Addendum)
Informed patient of titration results and verbalized understanding was indicated. Patient understands he has sleep apnea and Dr Radford Pax has ordered him a CPAP in EPIC. Patient understands he will be contacted by [facility] to set up his cpap. He understands to call if [facility] does not contact him with new setup in a timely manner. He understands he will be called once confirmation has been received from [facility] that he has received his new machine to schedule 10 week follow up appointment.  [facility]  FAXED new cpap order  Please add to William Kim he was grateful for the call and thanked me

## 2017-05-05 ENCOUNTER — Telehealth: Payer: Self-pay | Admitting: Cardiology

## 2017-05-05 NOTE — Telephone Encounter (Signed)
New message      Left msg with answering service    1) What problem are you experiencing? none  2) Who is your medical equipment company? Patient left message with answering service to check the status of his CPAP machine , please call patient (best time 11am)   Please route to the sleep study assistant.

## 2017-05-05 NOTE — Telephone Encounter (Signed)
Reached out to patient and informed him that his insurance was out of network for [employer] which is why he has not been contacted yet about his CPAP. Informed patient that he needs to call his insurance carrier to find out who they recommend as a DME. Patient verbalized understanding and thanked me. Patient will call back when he has the correct DME.

## 2017-05-15 ENCOUNTER — Ambulatory Visit (HOSPITAL_COMMUNITY)
Admission: RE | Admit: 2017-05-15 | Discharge: 2017-05-15 | Disposition: A | Discharge status: Home / Self Care | Payer: 59 | Source: Ambulatory Visit | Attending: Nurse Practitioner | Admitting: Nurse Practitioner

## 2017-05-15 ENCOUNTER — Encounter (HOSPITAL_COMMUNITY): Payer: Self-pay | Admitting: Nurse Practitioner

## 2017-05-15 VITALS — BP 132/84 | HR 69 | Ht 72.0 in | Wt 239.0 lb

## 2017-05-15 DIAGNOSIS — I4891 Unspecified atrial fibrillation: Secondary | ICD-10-CM | POA: Diagnosis present

## 2017-05-15 DIAGNOSIS — I480 Paroxysmal atrial fibrillation: Secondary | ICD-10-CM | POA: Diagnosis not present

## 2017-05-15 DIAGNOSIS — Z87891 Personal history of nicotine dependence: Secondary | ICD-10-CM | POA: Insufficient documentation

## 2017-05-15 DIAGNOSIS — E7800 Pure hypercholesterolemia, unspecified: Secondary | ICD-10-CM | POA: Insufficient documentation

## 2017-05-15 DIAGNOSIS — I10 Essential (primary) hypertension: Secondary | ICD-10-CM | POA: Insufficient documentation

## 2017-05-15 DIAGNOSIS — G4730 Sleep apnea, unspecified: Secondary | ICD-10-CM | POA: Diagnosis not present

## 2017-05-15 DIAGNOSIS — Z79899 Other long term (current) drug therapy: Secondary | ICD-10-CM | POA: Diagnosis not present

## 2017-05-15 DIAGNOSIS — F1010 Alcohol abuse, uncomplicated: Secondary | ICD-10-CM | POA: Diagnosis not present

## 2017-05-15 MED ORDER — DILTIAZEM HCL ER COATED BEADS 180 MG PO CP24: 180.0000 mg | ORAL_CAPSULE | Freq: Every day | ORAL | 3 refills | Status: DC

## 2017-05-15 MED ORDER — DILTIAZEM HCL ER COATED BEADS 120 MG PO CP24: 120.0000 mg | ORAL_CAPSULE | Freq: Every day | ORAL | 2 refills | Status: DC

## 2017-05-15 NOTE — Progress Notes (Signed)
Primary Care Physician: Redmond School, MD Referring Physician: [facility] ER f/u   William Kim is a 51 y.o. male with a h/o HTN, alcohol use and untreated probable sleep apnea. It is documented in Epic in 2014 that he was treated for afib in the ER. The pt reports that he would have issues with irregular heart beat off and on since then but episodes would be short lived. He noticed the episodes were more frequent over the last few weeks and recently present to the ER, 7/31, with afib with RVR.He was given IV diltiazem and converted. Discharged home on xarelto 20 mg for a chadsvasc score of 1 and daily diltiazem 120 mg a day.  In the afib clinic, 8/3, He is in [place]. No further afib. He was drinking 6 alcoholic drinks a night and was told in the ER to diminish alcohol intake. He also reports significant snoring/ apnea and about 8 years ago, had a sleep study but when the cpap mask was applied, he did not like the feeling and left the test. 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 He denies tobacco or caffeine use.  F/u in afib clinic 8/27. He is in [place]. Has noted a few short lived episodes of irregular heart beat. He will finish xarelto for a chadsvasc score of 1(htn). Echo showed Mild LVH. He has tolerated addition of diltiazem daily. Sleep study is pending. He has cut back on alcohol but has not had cessation.  F/u afib clinic, he had repeated sleep study and is pending results of test. Still  notices some episodes of afib lasting around 15-20 mins. Still has cut down on alcohol but two nights ago had 2 beers and a margarita. He is exercising more and has lost 4 lbs. He is now off anticoagulation.  F/u in afib clinic, 11/16. He continues with alcohol abuse, drinking a beer and a margarita every night. He has been found to have sleep apnea and is pending getting his CPAP. He notices some irregular heart rate intermittently but it sounds more consistent with premature contractions.   Today, he denies  symptoms of palpitations, chest pain, shortness of breath, orthopnea, PND, lower extremity edema, dizziness, presyncope, syncope, or neurologic sequela. The patient is tolerating medications without difficulties and is otherwise without complaint today.   Past Medical History:  Diagnosis Date  . High cholesterol   . Hypertension    No past surgical history on file.  Current Outpatient Medications  Medication Sig Dispense Refill  . Ascorbic Acid (VITAMIN C PO) Take 1 tablet by mouth daily.    . Cranberry 1000 MG CAPS Take 1 capsule by mouth daily.    Marland Kitchen diltiazem (CARDIZEM CD) 120 MG 24 hr capsule Take 1 capsule (120 mg total) at bedtime by mouth. 180 capsule 2  . Omega-3 Fatty Acids (FISH OIL PO) Take 1 capsule by mouth 2 (two) times daily.     . simvastatin (ZOCOR) 10 MG tablet Take 1 tablet (10 mg total) by mouth at bedtime. 90 tablet 2  . valACYclovir (VALTREX) 500 MG tablet Take 1 tablet by mouth daily.    Marland Kitchen diltiazem (CARDIZEM CD) 180 MG 24 hr capsule Take 1 capsule (180 mg total) daily by mouth. 30 capsule 3   No current facility-administered medications for this encounter.     No Known Allergies  Social History   Socioeconomic History  . Marital status: Married    Spouse name: Not on file  . Number of  children: Not on file  . Years of education: Not on file  . Highest education level: Not on file  Social Needs  . Financial resource strain: Not on file  . Food insecurity - worry: Not on file  . Food insecurity - inability: Not on file  . Transportation needs - medical: Not on file  . Transportation needs - non-medical: Not on file  Occupational History  . Not on file  Tobacco Use  . Smoking status: Former Research scientist (life sciences)  . Smokeless tobacco: Never Used  Substance and Sexual Activity  . Alcohol use: Yes    Comment: "1-2 beers and maybe a mixed drink a day"  . Drug use: No  . Sexual activity: Not on file  Other Topics Concern  . Not on file  Social History Narrative  .  Not on file    No family history on file.  ROS- All systems are reviewed and negative except as per the HPI above  Physical Exam: Vitals:   05/15/17 0932  BP: 132/84  Pulse: 69  Weight: 239 lb (108.4 kg)  Height: 6' (1.829 m)   Wt Readings from Last 3 Encounters:  05/15/17 239 lb (108.4 kg)  04/03/17 235 lb 9.6 oz (106.9 kg)  03/29/17 229 lb (103.9 kg)    Labs: Lab Results  Component Value Date   NA 140 01/27/2017   K 3.8 01/27/2017   CL 108 01/27/2017   CO2 25 01/27/2017   GLUCOSE 100 (H) 01/27/2017   BUN 16 01/27/2017   CREATININE 0.94 01/27/2017   CALCIUM 9.3 01/27/2017   No results found for: INR No results found for: CHOL, HDL, LDLCALC, TRIG   GEN- The patient is well appearing, alert and oriented x 3 today.   Head- normocephalic, atraumatic Eyes-  Sclera clear, conjunctiva pink Ears- hearing intact Oropharynx- clear Neck- supple, no JVP Lymph- no cervical lymphadenopathy Lungs- Clear to ausculation bilaterally, normal work of breathing Heart- Regular rate and rhythm, no murmurs, rubs or gallops, PMI not laterally displaced GI- soft, NT, ND, + BS Extremities- no clubbing, cyanosis, or edema MS- no significant deformity or atrophy Skin- no rash or lesion Psych- euthymic mood, full affect Neuro- strength and sensation are intact  EKG- NSR at 69 bpm with one PAC, pr int 172 ms, qrs int 80 ms, qtc 387 ms Epic records reviewed Echo-Study Conclusions  - Left ventricle: The cavity size was normal. Wall thickness was   increased in a pattern of mild LVH. Systolic function was normal.   The estimated ejection fraction was in the range of 60% to 65%.   Low normal GLPSS at -18%. Wall motion was normal; there were no   regional wall motion abnormalities. Doppler parameters are   consistent with abnormal left ventricular relaxation (grade 1   diastolic dysfunction). The E/e&' ratio is between 8-15,   suggesting indeterminate LV filling pressure. - Mitral  valve: Mildly thickened leaflets . There was trivial   regurgitation. - Left atrium: The atrium was normal in size. - Inferior vena cava: The vessel was normal in size. The   respirophasic diameter changes were in the normal range (>= 50%),   consistent with normal central venous pressure.  Impressions:  - LVEF 60-65%, mild LVH, normal wall thickness, normal wall motion,   grade 1 DD, indeterminate LV filling pressure, normal LA Size,   normal IVC.   Assessment and Plan: 1. Paroxysmal afib General education re afib Continue diltiazem but increase to 180 mg am and 120  mg pm for some palpitations that sound consistent with PC's Off xarelto, with a chadsvasc score of 1, can take 81 mg ASA  2. HTN Well controlled today Off amlodipine with increase of cardizem   3. Lifestyle measures Decrease alcohol to no more than 2 drinks a week Sleep study shows sleep apnea, pending getting cpap Encouraged to continue exercise and weight loss efforts   Will request  f/u with Dr. Radford Pax 3-4 months to  follow for general cardiology as well as sleep apnea  Butch Penny C. Carroll, [facility] [address] [place], [place] [address] [phone]

## 2017-05-15 NOTE — Patient Instructions (Signed)
Your physician has recommended you make the following change in your medication: 1)Start Cardizem 180mg  in the morning and continue the cardizem 120mg  in the evening

## 2017-06-11 NOTE — Telephone Encounter (Addendum)
Fax came today from [facility] today confirming they have received the order for sleep study referral. Patient's DME has changed to [facility].  Patient understands he will be contacted by [facility] to set up his cpap. He understands to call if [facility] does not contact him with new setup in a timely manner. He understands he will be called once confirmation has been received from [facility] that he has received his new machine to schedule 10 week follow up appointment.  [facility] notified of new cpap order He was grateful for the call and thanked me     

## 2017-10-05 ENCOUNTER — Encounter (HOSPITAL_COMMUNITY): Payer: Self-pay | Admitting: *Deleted

## 2017-10-12 ENCOUNTER — Other Ambulatory Visit (HOSPITAL_COMMUNITY): Payer: Self-pay | Admitting: Nurse Practitioner

## 2017-10-27 ENCOUNTER — Other Ambulatory Visit (HOSPITAL_COMMUNITY): Payer: Self-pay | Admitting: *Deleted

## 2017-10-27 MED ORDER — DILTIAZEM HCL ER COATED BEADS 120 MG PO CP24: 120.0000 mg | ORAL_CAPSULE | Freq: Every day | ORAL | 1 refills | Status: DC

## 2017-10-27 MED ORDER — DILTIAZEM HCL ER COATED BEADS 120 MG PO CP24: 120.0000 mg | ORAL_CAPSULE | Freq: Every day | ORAL | 2 refills | Status: DC

## 2017-11-04 ENCOUNTER — Other Ambulatory Visit (HOSPITAL_COMMUNITY): Payer: Self-pay | Admitting: Physician Assistant

## 2017-11-04 ENCOUNTER — Ambulatory Visit (HOSPITAL_COMMUNITY)
Admission: RE | Admit: 2017-11-04 | Discharge: 2017-11-04 | Disposition: A | Discharge status: Home / Self Care | Payer: 59 | Source: Ambulatory Visit | Attending: Physician Assistant | Admitting: Physician Assistant

## 2017-11-04 DIAGNOSIS — M25551 Pain in right hip: Secondary | ICD-10-CM

## 2017-11-04 DIAGNOSIS — M79671 Pain in right foot: Secondary | ICD-10-CM | POA: Diagnosis present

## 2017-11-04 DIAGNOSIS — M25511 Pain in right shoulder: Secondary | ICD-10-CM | POA: Insufficient documentation

## 2017-11-04 DIAGNOSIS — F419 Anxiety disorder, unspecified: Secondary | ICD-10-CM | POA: Insufficient documentation

## 2018-03-23 ENCOUNTER — Encounter: Payer: Self-pay | Admitting: Internal Medicine

## 2018-04-05 ENCOUNTER — Encounter (HOSPITAL_COMMUNITY): Payer: Self-pay | Admitting: *Deleted

## 2018-05-10 ENCOUNTER — Encounter: Payer: Self-pay | Admitting: Cardiology

## 2018-05-31 ENCOUNTER — Telehealth: Payer: Self-pay | Admitting: *Deleted

## 2018-05-31 ENCOUNTER — Ambulatory Visit (INDEPENDENT_AMBULATORY_CARE_PROVIDER_SITE_OTHER): Payer: 59 | Admitting: Cardiology

## 2018-05-31 ENCOUNTER — Encounter: Payer: Self-pay | Admitting: Cardiology

## 2018-05-31 DIAGNOSIS — E669 Obesity, unspecified: Secondary | ICD-10-CM

## 2018-05-31 DIAGNOSIS — I10 Essential (primary) hypertension: Secondary | ICD-10-CM

## 2018-05-31 DIAGNOSIS — G4733 Obstructive sleep apnea (adult) (pediatric): Secondary | ICD-10-CM | POA: Diagnosis not present

## 2018-05-31 HISTORY — DX: Obstructive sleep apnea (adult) (pediatric): G47.33

## 2018-05-31 HISTORY — DX: Obesity, unspecified: E66.9

## 2018-05-31 NOTE — Progress Notes (Signed)
Cardiology Office Note:    Date:  05/31/2018   ID:  William Kim, DOB June 14, 1966, MRN [id number]  PCP:  Redmond School, MD  Cardiologist:  No primary care provider on file.    Referring MD: Sherran Needs, NP   Chief Complaint  Patient presents with  . Sleep Apnea  . Hypertension    History of Present Illness:    William Kim is a 52 y.o. male with a hx of hypertension and obstructive sleep apnea.  He had a sleep study ordered a year ago due to snoring and excessive daytime fatigue with an Epworth sleepiness scale of 20.  He was found to have severe obstructive sleep apnea with an AHI of 79/h no significant central sleep apnea.  Oxygen saturations were as low as 58%.  He underwent a CPAP titration to 19 cm H2O. unfortunately he never followed up post titration and has been having problems with his CPAP device for the past year but never called in.  He says he tolerates the full facemask fine but when he goes to turn the machine on the pressure is so forceful that he cannot keep the mask on and has to take it off.  Past Medical History:  Diagnosis Date  . High cholesterol   . Hypertension   . OSA (obstructive sleep apnea) 05/31/2018   Severe OSA with AHI 79/h with significant oxygen desaturations as low as 58%.    History reviewed. No pertinent surgical history.  Current Medications: Current Meds  Medication Sig  . Ascorbic Acid (VITAMIN C PO) Take 1 tablet by mouth daily.  . Cranberry 1000 MG CAPS Take 1 capsule by mouth daily.  Marland Kitchen diltiazem (CARDIZEM CD) 120 MG 24 hr capsule Take 1 capsule (120 mg total) by mouth at bedtime.  Marland Kitchen diltiazem (CARDIZEM CD) 180 MG 24 hr capsule Take 1 capsule (180 mg total) daily by mouth.  . Omega-3 Fatty Acids (FISH OIL PO) Take 1 capsule by mouth 2 (two) times daily.   . simvastatin (ZOCOR) 20 MG tablet Take 20 mg by mouth daily.  . valACYclovir (VALTREX) 500 MG tablet Take 1 tablet by mouth daily.     Allergies:   Patient has no  known allergies.   Social History   Socioeconomic History  . Marital status: Married    Spouse name: Not on file  . Number of children: Not on file  . Years of education: Not on file  . Highest education level: Not on file  Occupational History  . Not on file  Social Needs  . Financial resource strain: Not on file  . Food insecurity:    Worry: Not on file    Inability: Not on file  . Transportation needs:    Medical: Not on file    Non-medical: Not on file  Tobacco Use  . Smoking status: Former Research scientist (life sciences)  . Smokeless tobacco: Never Used  Substance and Sexual Activity  . Alcohol use: Yes    Comment: "1-2 beers and maybe a mixed drink a day"  . Drug use: No  . Sexual activity: Not on file  Lifestyle  . Physical activity:    Days per week: Not on file    Minutes per session: Not on file  . Stress: Not on file  Relationships  . Social connections:    Talks on phone: Not on file    Gets together: Not on file    Attends religious service: Not on file    Active  member of club or organization: Not on file    Attends meetings of clubs or organizations: Not on file    Relationship status: Not on file  Other Topics Concern  . Not on file  Social History Narrative  . Not on file     Family History: The patient's family history is not on file.  ROS:   Please see the history of present illness.    ROS  All other systems reviewed and negative.   EKGs/Labs/Other Studies Reviewed:    The following studies were reviewed today: Sleep study 01/2017  EKG:  EKG is not ordered today.    Recent Labs: No results found for requested labs within last 8760 hours.   Recent Lipid Panel No results found for: CHOL, TRIG, HDL, CHOLHDL, VLDL, LDLCALC, LDLDIRECT  Physical Exam:    VS:  BP 132/86   Pulse 87   Ht 6' (1.829 m)   Wt 235 lb (106.6 kg)   SpO2 94%   BMI 31.87 kg/m      Wt Readings from Last 3 Encounters:  05/31/18 235 lb (106.6 kg)  05/15/17 239 lb (108.4 kg)    04/03/17 235 lb 9.6 oz (106.9 kg)     GEN:  Well nourished, well developed in no acute distress HEENT: Normal NECK: No JVD; No carotid bruits LYMPHATICS: No lymphadenopathy CARDIAC: RRR, no murmurs, rubs, gallops RESPIRATORY:  Clear to auscultation without rales, wheezing or rhonchi  ABDOMEN: Soft, non-tender, non-distended MUSCULOSKELETAL:  No edema; No deformity  SKIN: Warm and dry NEUROLOGIC:  Alert and oriented x 3 PSYCHIATRIC:  Normal affect   ASSESSMENT:    1. OSA (obstructive sleep apnea)   2. Benign essential HTN   3. Obesity (BMI 30-39.9)    PLAN:    In order of problems listed above:  1.  OSA - sleep study was done for excessive daytime sleepiness and snoring as well as elevated Epworth sleepiness scale 20.  He was found to have severe obstructive sleep apnea with an AHI of 79/h and significant oxygen desaturations as low as 85%.  He underwent titration to 18 cm H2O but apparently never got his CPAP device.  Fortunately he never followed back up after starting CPAP.  He has not been able to use his device because he says it soon as he turns it on the pressure is really high and he cannot tolerate it.  He uses a full facemask which he tolerates the mask but when the pressure starts then he has to take it off because of such a high pressure.  I recommended changing him to auto CPAP from 5 to 18 cm H2O.  I will get a download in 2 weeks and see him back in 6 weeks to see how he is doing.  2.  HTN -BP is well controlled on exam today.  He will continue on Cardizem CD 180 mg daily.  3.  Obesity - I have encouraged him to get into a routine exercise program and cut back on carbs and portions.    Medication Adjustments/Labs and Tests Ordered: Current medicines are reviewed at length with the patient today.  Concerns regarding medicines are outlined above.  No orders of the defined types were placed in this encounter.  No orders of the defined types were placed in this  encounter.   Signed, Fransico Him, MD  05/31/2018 9:14 AM    [place]

## 2018-05-31 NOTE — Telephone Encounter (Signed)
Order placed to [facility]. 

## 2018-05-31 NOTE — Telephone Encounter (Signed)
-----   Message from Sarina Ill, RN sent at 05/31/2018  9:38 AM EST ----- Regarding: Sleep Hello, Dr. Radford Pax ordered change in CPAP to auto and a download in 2 weeks and supplies. Orders placed, please send. Thanks, [employer]

## 2018-05-31 NOTE — Telephone Encounter (Signed)
Fax came today from [facility] today confirming they have received the order for sleep study referral. Patient's DME has changed to [facility].  Patient understands he will be contacted by [facility] to set up his cpap. He understands to call if [facility] does not contact him with new setup in a timely manner. He understands he will be called once confirmation has been received from [place] that he has received his new machine to schedule 10 week follow up appointment.  [facility] notified of new cpap order He was grateful for the call and thanked me

## 2018-05-31 NOTE — Patient Instructions (Signed)
Medication Instructions:  Your physician recommends that you continue on your current medications as directed. Please refer to the Current Medication list given to you today.  If you need a refill on your cardiac medications before your next appointment, please call your pharmacy.   Lab work:  If you have labs (blood work) drawn today and your tests are completely normal, you will receive your results only by: Marland Kitchen MyChart Message (if you have MyChart) OR . A paper copy in the mail If you have any lab test that is abnormal or we need to change your treatment, we will call you to review the results.  Follow-Up: Follow up in 6 weeks with Dr. Radford Pax

## 2018-07-02 ENCOUNTER — Other Ambulatory Visit: Payer: Self-pay | Admitting: *Deleted

## 2018-07-02 ENCOUNTER — Encounter: Payer: Self-pay | Admitting: *Deleted

## 2018-07-02 ENCOUNTER — Ambulatory Visit (INDEPENDENT_AMBULATORY_CARE_PROVIDER_SITE_OTHER): Payer: 59 | Admitting: Gastroenterology

## 2018-07-02 ENCOUNTER — Encounter: Payer: Self-pay | Admitting: Gastroenterology

## 2018-07-02 ENCOUNTER — Telehealth: Payer: Self-pay | Admitting: *Deleted

## 2018-07-02 DIAGNOSIS — Z1211 Encounter for screening for malignant neoplasm of colon: Secondary | ICD-10-CM

## 2018-07-02 HISTORY — DX: Encounter for screening for malignant neoplasm of colon: Z12.11

## 2018-07-02 MED ORDER — NA SULFATE-K SULFATE-MG SULF 17.5-3.13-1.6 GM/177ML PO SOLN: 1.0000 | ORAL | 0 refills | Status: DC

## 2018-07-02 NOTE — Patient Instructions (Signed)
We have arranged a colonoscopy in the near future with Dr. Rourk.  Further recommendations to follow!  It was a pleasure to see you today. I strive to create trusting relationships with patients to provide genuine, compassionate, and quality care. I value your feedback. If you receive a survey regarding your visit,  I greatly appreciate you taking time to fill this out.   Samora Jernberg W. Taji Sather, PhD, ANP-BC [facility]    

## 2018-07-02 NOTE — Progress Notes (Signed)
cc'ed to pcp °

## 2018-07-02 NOTE — Telephone Encounter (Signed)
Pre-op scheduled for 07/27/2018 at 12:45pm. Letter mailed. Patient aware of appt details

## 2018-07-02 NOTE — Assessment & Plan Note (Signed)
53 year old pleasant male without any GI concerns, presenting for initial screening colonoscopy. Average risk with no family history of colorectal cancer or polyps.   Proceed with TCS with Dr. Gala Romney in near future: the risks, benefits, and alternatives have been discussed with the patient in detail. The patient states understanding and desires to proceed. Propofol due to daily alcohol intake and OSA

## 2018-07-02 NOTE — Progress Notes (Signed)
Primary Care Physician:  Redmond School, MD Primary Gastroenterologist:  Dr. Gala Romney   Chief Complaint  Patient presents with  . Colonoscopy    HPI:   William Kim is a 53 y.o. male presenting today at the request of Dr. Gerarda Fraction for initial screening colonoscopy.   He was brought in due to daily alcohol intake and need for Propofol. No abdominal pain, rectal bleeding, changes in bowel habits, unintentional weight loss, lack of appetite, dysphagia. No family history of colorectal cancer or colon polyps. No GI concerns today.   Past Medical History:  Diagnosis Date  . A-fib ([place])   . High cholesterol   . Hypertension   . OSA (obstructive sleep apnea) 05/31/2018   Severe OSA with AHI 79/h with significant oxygen desaturations as low as 58%.    Past Surgical History:  Procedure Laterality Date  . None      Current Outpatient Medications  Medication Sig Dispense Refill  . Ascorbic Acid (VITAMIN C PO) Take 1 tablet by mouth daily.    . Cranberry 1000 MG CAPS Take 1 capsule by mouth daily.    Marland Kitchen diltiazem (CARDIZEM CD) 120 MG 24 hr capsule Take 1 capsule (120 mg total) by mouth at bedtime. 90 capsule 2  . diltiazem (CARDIZEM CD) 180 MG 24 hr capsule Take 1 capsule (180 mg total) daily by mouth. 30 capsule 3  . Omega-3 Fatty Acids (FISH OIL PO) Take 1 capsule by mouth 2 (two) times daily.     . simvastatin (ZOCOR) 20 MG tablet Take 20 mg by mouth daily.    . valACYclovir (VALTREX) 500 MG tablet Take 1 tablet by mouth daily.     No current facility-administered medications for this visit.     Allergies as of 07/02/2018  . (No Known Allergies)    Family History  Problem Relation Age of Onset  . Colon cancer Neg Hx   . Colon polyps Neg Hx     Social History   Socioeconomic History  . Marital status: Married    Spouse name: Not on file  . Number of children: Not on file  . Years of education: Not on file  . Highest education level: Not on file  Occupational History   . Not on file  Social Needs  . Financial resource strain: Not on file  . Food insecurity:    Worry: Not on file    Inability: Not on file  . Transportation needs:    Medical: Not on file    Non-medical: Not on file  Tobacco Use  . Smoking status: Former Research scientist (life sciences)  . Smokeless tobacco: Never Used  . Tobacco comment: quit smoking in 2012/2013, quit smoking August 16th  Substance and Sexual Activity  . Alcohol use: Yes    Comment: "1-2 beers and maybe a mixed drink a day"  . Drug use: No  . Sexual activity: Not on file  Lifestyle  . Physical activity:    Days per week: Not on file    Minutes per session: Not on file  . Stress: Not on file  Relationships  . Social connections:    Talks on phone: Not on file    Gets together: Not on file    Attends religious service: Not on file    Active member of club or organization: Not on file    Attends meetings of clubs or organizations: Not on file    Relationship status: Not on file  . Intimate partner violence:  Fear of current or ex partner: Not on file    Emotionally abused: Not on file    Physically abused: Not on file    Forced sexual activity: Not on file  Other Topics Concern  . Not on file  Social History Narrative  . Not on file    Review of Systems: Gen: Denies any fever, chills, fatigue, weight loss, lack of appetite.  CV: Denies chest pain, heart palpitations, peripheral edema, syncope.  Resp: Denies shortness of breath at rest or with exertion. Denies wheezing or cough.  GI: see HPI  GU : Denies urinary burning, urinary frequency, urinary hesitancy MS: Denies joint pain, muscle weakness, cramps, or limitation of movement.  Derm: Denies rash, itching, dry skin Psych: Denies depression, anxiety, memory loss, and confusion Heme: Denies bruising, bleeding, and enlarged lymph nodes.  Physical Exam: BP 140/89   Pulse 76   Temp (!) 97 F (36.1 C) (Oral)   Ht 6' (1.829 m)   Wt 239 lb 12.8 oz (108.8 kg)   BMI 32.52  kg/m  General:   Alert and oriented. Pleasant and cooperative. Well-nourished and well-developed.  Head:  Normocephalic and atraumatic. Eyes:  Without icterus, sclera clear and conjunctiva pink.  Ears:  Normal auditory acuity. Nose:  No deformity, discharge,  or lesions. Mouth:  No deformity or lesions, oral mucosa pink.  Lungs:  Clear to auscultation bilaterally.  Heart:  S1, S2 present, irregularly irregular Abdomen:  +BS, soft, non-tender and non-distended. No HSM noted. No guarding or rebound. No masses appreciated. Possible small umbilical hernia.  Rectal:  Deferred  Msk:  Symmetrical without gross deformities. Normal posture. Extremities:  Without edema. Neurologic:  Alert and  oriented x4 Skin:  Intact without significant lesions or rashes. Psych:  Alert and cooperative. Normal mood and affect.

## 2018-07-04 NOTE — Progress Notes (Signed)
Cardiology Office Note:    Date:  07/05/2018   ID:  William Kim, DOB 1966/05/12, MRN [id number]  PCP:  Redmond School, MD  Cardiologist:  No primary care provider on file.    Referring MD: Redmond School, MD   Chief Complaint  Patient presents with  . Sleep Apnea    History of Present Illness:    William Kim is a 53 y.o. male with a hx of of hypertension and obstructive sleep apnea.  He had a sleep study ordered due to snoring and excessive daytime fatigue with an Epworth sleepiness scale of 20.  He was found to have severe obstructive sleep apnea with an AHI of 79/h no significant central sleep apnea.  Oxygen saturations were as low as 58%.  He underwent a CPAP titration to 19 cm H2O.  Unfortunately he could not tolerate the high pressure and was placed on auto CPAP from 5 to 18cm H2O.  He is now back for followup.  He is still having problems with his CPAP device.  He says that early in the am he will awaken gasping for breath and have to take his mask off to breath.  He also has not been compliant with his device.  He tells me that when he gets home from work he is exhausted and will sit down in the chair and immediately fall asleep and then not get up and go to bed and put his device on until as late as 2am and get up at 4:30am.    Past Medical History:  Diagnosis Date  . A-fib ([place])   . High cholesterol   . Hypertension   . OSA (obstructive sleep apnea) 05/31/2018   Severe OSA with AHI 79/h with significant oxygen desaturations as low as 58%.    Past Surgical History:  Procedure Laterality Date  . None      Current Medications: Current Meds  Medication Sig  . Ascorbic Acid (VITAMIN C PO) Take 1 tablet by mouth daily.  . Cranberry 1000 MG CAPS Take 1 capsule by mouth daily.  Marland Kitchen diltiazem (CARDIZEM CD) 120 MG 24 hr capsule Take 1 capsule (120 mg total) by mouth at bedtime.  Marland Kitchen diltiazem (CARDIZEM CD) 180 MG 24 hr capsule Take 1 capsule (180 mg total) daily by  mouth.  . Na Sulfate-K Sulfate-Mg Sulf 17.5-3.13-1.6 GM/177ML SOLN Take 1 kit by mouth as directed.  . Omega-3 Fatty Acids (FISH OIL PO) Take 1 capsule by mouth daily.   . simvastatin (ZOCOR) 20 MG tablet Take 20 mg by mouth daily.  . valACYclovir (VALTREX) 500 MG tablet Take 1 tablet by mouth daily.     Allergies:   Patient has no known allergies.   Social History   Socioeconomic History  . Marital status: Married    Spouse name: Not on file  . Number of children: Not on file  . Years of education: Not on file  . Highest education level: Not on file  Occupational History  . Not on file  Social Needs  . Financial resource strain: Not on file  . Food insecurity:    Worry: Not on file    Inability: Not on file  . Transportation needs:    Medical: Not on file    Non-medical: Not on file  Tobacco Use  . Smoking status: Former Research scientist (life sciences)  . Smokeless tobacco: Never Used  . Tobacco comment: quit smoking in 2012/2013, quit smoking August 16th  Substance and Sexual Activity  . Alcohol  use: Yes    Comment: "1-2 beers and maybe a mixed drink a day"  . Drug use: No  . Sexual activity: Not on file  Lifestyle  . Physical activity:    Days per week: Not on file    Minutes per session: Not on file  . Stress: Not on file  Relationships  . Social connections:    Talks on phone: Not on file    Gets together: Not on file    Attends religious service: Not on file    Active member of club or organization: Not on file    Attends meetings of clubs or organizations: Not on file    Relationship status: Not on file  Other Topics Concern  . Not on file  Social History Narrative  . Not on file     Family History: The patient's family history is negative for Colon cancer and Colon polyps.  ROS:   Please see the history of present illness.    ROS  All other systems reviewed and negative.   EKGs/Labs/Other Studies Reviewed:    The following studies were reviewed today: PAP  download  EKG:  EKG is not ordered today.    Recent Labs: No results found for requested labs within last 8760 hours.   Recent Lipid Panel No results found for: CHOL, TRIG, HDL, CHOLHDL, VLDL, LDLCALC, LDLDIRECT  Physical Exam:    VS:  BP 122/90   Pulse 65   Ht 6' (1.829 m)   Wt 240 lb (108.9 kg)   SpO2 95%   BMI 32.55 kg/m     Wt Readings from Last 3 Encounters:  07/05/18 240 lb (108.9 kg)  07/02/18 239 lb 12.8 oz (108.8 kg)  05/31/18 235 lb (106.6 kg)     GEN:  Well nourished, well developed in no acute distress HEENT: Normal NECK: No JVD; No carotid bruits LYMPHATICS: No lymphadenopathy CARDIAC: RRR, no murmurs, rubs, gallops RESPIRATORY:  Clear to auscultation without rales, wheezing or rhonchi  ABDOMEN: Soft, non-tender, non-distended MUSCULOSKELETAL:  No edema; No deformity  SKIN: Warm and dry NEUROLOGIC:  Alert and oriented x 3 PSYCHIATRIC:  Normal affect   ASSESSMENT:    1. OSA (obstructive sleep apnea)   2. Benign essential HTN   3. Obesity (BMI 30-39.9)    PLAN:    In order of problems listed above:  1.  OSA - the patient is tolerating PAP therapy well without any problems. The PAP download was reviewed today and showed an AHI of 15.8/hr on auto PAPwith 3% compliance in using more than 4 hours nightly.  The patient has been using and benefiting from PAP use and will continue to benefit from therapy. He is clearly no tolerating CPAP therapy and I suspect that he is not getting enough pressure during REM sleep which is resulting in a feeling of suffocating towards the time he gets up in the am.  I am going to refer him for a BIPAP titration.  I also talked with him about ways to be compliant with his device.  I encouraged him to go to bed in the evening instead of sitting down in the chair and when he goes to lay down on his bed he needs to put his mask on.  I will see him back after his BiPAP titration.  2.  HTN - BP is well controlled on exam today.  He  will continue on Cardizem CD '300mg'$  daily.    3.  Obesity - I have  encouraged him to get into a routine exercise program and cut back on carbs and portions.   Medication Adjustments/Labs and Tests Ordered: Current medicines are reviewed at length with the patient today.  Concerns regarding medicines are outlined above.  Orders Placed This Encounter  Procedures  . EKG 12-Lead  . Bipap titration   No orders of the defined types were placed in this encounter.   Signed, Fransico Him, MD  07/05/2018 10:00 AM    [place]

## 2018-07-05 ENCOUNTER — Telehealth: Payer: Self-pay | Admitting: *Deleted

## 2018-07-05 ENCOUNTER — Encounter (INDEPENDENT_AMBULATORY_CARE_PROVIDER_SITE_OTHER): Payer: Self-pay

## 2018-07-05 ENCOUNTER — Encounter: Payer: Self-pay | Admitting: Cardiology

## 2018-07-05 ENCOUNTER — Ambulatory Visit (INDEPENDENT_AMBULATORY_CARE_PROVIDER_SITE_OTHER): Payer: 59 | Admitting: Cardiology

## 2018-07-05 VITALS — BP 122/90 | HR 65 | Ht 72.0 in | Wt 240.0 lb

## 2018-07-05 DIAGNOSIS — G4733 Obstructive sleep apnea (adult) (pediatric): Secondary | ICD-10-CM

## 2018-07-05 DIAGNOSIS — I10 Essential (primary) hypertension: Secondary | ICD-10-CM

## 2018-07-05 DIAGNOSIS — E669 Obesity, unspecified: Secondary | ICD-10-CM | POA: Diagnosis not present

## 2018-07-05 NOTE — Patient Instructions (Signed)
Medication Instructions:  Your physician recommends that you continue on your current medications as directed. Please refer to the Current Medication list given to you today.  If you need a refill on your cardiac medications before your next appointment, please call your pharmacy.   Lab work:  If you have labs (blood work) drawn today and your tests are completely normal, you will receive your results only by: Marland Kitchen MyChart Message (if you have MyChart) OR . A paper copy in the mail If you have any lab test that is abnormal or we need to change your treatment, we will call you to review the results.  Testing/Procedures: BiPAP titration, our sleep coordinator will contact you.   Follow-Up: After titration we will contact you for further follow up.

## 2018-07-05 NOTE — Telephone Encounter (Signed)
-----   Message from Sarina Ill, RN sent at 07/05/2018 10:02 AM EST ----- Regarding: [facility], Dr. Radford Pax ordered a BiPAP titration in lab. Orders placed, please schedule. Thanks, [employer]

## 2018-07-23 NOTE — Patient Instructions (Signed)
William Kim  07/23/2018     @PREFPERIOPPHARMACY @   Your procedure is scheduled on  08/02/2018.  Report to [name] at  [name]  [facility].  Call this number if you have problems the morning of surgery:  [phone]   Remember:  Follow the diet and prep instructions given to you by Dr Roseanne Kaufman office.                     Take these medicines the morning of surgery with A SIP OF WATER  Diltiazem.    Do not wear jewelry, make-up or nail polish.  Do not wear lotions, powders, or perfumes, or deodorant.  Do not shave 48 hours prior to surgery.  Men may shave face and neck.  Do not bring valuables to the hospital.  [facility] is not responsible for any belongings or valuables.  Contacts, dentures or bridgework may not be worn into surgery.  Leave your suitcase in the car.  After surgery it may be brought to your room.  For patients admitted to the hospital, discharge time will be determined by your treatment team.  Patients discharged the day of surgery will not be allowed to drive home.   Name and phone number of your driver:   family Special instructions:  None  Please read over the following fact sheets that you were given. Anesthesia Post-op Instructions and Care and Recovery After Surgery       Colonoscopy, Adult A colonoscopy is an exam to look at the large intestine. It is done to check for problems, such as:  Lumps (tumors).  Growths (polyps).  Swelling (inflammation).  Bleeding. What happens before the procedure? Eating and drinking Follow instructions from your doctor about eating and drinking. These instructions may include:  A few days before the procedure - follow a low-fiber diet. ? Avoid nuts. ? Avoid seeds. ? Avoid dried fruit. ? Avoid raw fruits. ? Avoid vegetables.  1-3 days before the procedure - follow a clear liquid diet. Avoid liquids that have red or purple dye. Drink only clear liquids, such as: ? Clear broth or  bouillon. ? Black coffee or tea. ? Clear juice. ? Clear soft drinks or sports drinks. ? Gelatin dessert. ? Popsicles.  On the day of the procedure - do not eat or drink anything during the 2 hours before the procedure. Up to 2 hours before the procedure, you may continue to drink clear liquids, such as water or clear fruit juice.  Bowel prep If you were prescribed an oral bowel prep:  Take it as told by your doctor. Starting the day before your procedure, you will need to drink a lot of liquid. The liquid will cause you to poop (have bowel movements) until your poop is almost clear or light green.  To clean out your colon, you may also be given: ? Laxative medicines. ? Instructions about how to use an enema.  If your skin or butt gets irritated from diarrhea, you may: ? Wipe the area with wipes that have medicine in them, such as adult wet wipes with aloe and vitamin E. ? Put something on your skin that soothes the area, such as petroleum jelly.  If you throw up (vomit) while drinking the bowel prep, take a break for up to 60 minutes. Then begin the bowel prep again. If you keep throwing up and you cannot take the bowel prep without throwing up, call your  doctor. General instructions  Ask your doctor about: ? Changing or stopping your normal medicines. This is important if you take iron pills, diabetes medicines, or blood thinners. ? Taking medicines such as aspirin and ibuprofen. These medicines can thin your blood. Do not take these medicines unless your doctor tells you to take them.  Plan to have someone take you home from the hospital or clinic. What happens during the procedure?   An IV tube may be put into one of your veins.  You will be given medicine to help you relax (sedative).  To reduce your risk of infection: ? Your doctors will wash their hands. ? Your anal area will be washed with soap.  You will be asked to lie on your side with your knees bent.  Your  doctor will get a long, thin, flexible tube ready. The tube will have a camera and a light on the end.  The tube will be put into your anus.  The tube will be gently put into your large intestine.  Air will be delivered into your large intestine to keep it open. You may feel some pressure or cramping.  The camera will be used to take photos.  A small tissue sample may be removed for testing (biopsy).  If small growths are found, your doctor may remove them and have them checked for cancer.  The tube that was put into your anus will be slowly removed. The procedure may vary among doctors and hospitals. What happens after the procedure?  Your doctor will check on you often until the medicines you were given have worn off.  Do not drive for 24 hours after the procedure.  You may have a small amount of blood in your poop.  You may pass gas.  You may have mild cramps or bloating in your belly (abdomen).  It is up to you to get the results of your procedure. Ask your doctor, or the department performing the procedure, when your results will be ready. Summary  A colonoscopy is an exam to look at the large intestine.  Follow instructions from your doctor about eating and drinking before the procedure.  If you were prescribed an oral bowel prep to clean out your colon, take it as told by your doctor.  Your doctor will check on you often until the medicines you were given have worn off.  Plan to have someone take you home from the hospital or clinic. This information is not intended to replace advice given to you by your health care provider. Make sure you discuss any questions you have with your health care provider. Document Released: 07/19/2010 Document Revised: 04/15/2017 Document Reviewed: 08/28/2015 Elsevier Interactive Patient Education  2019 [employer]  Colonoscopy, Adult, Care After This sheet gives you information about how to care for yourself after your procedure.  Your health care provider may also give you more specific instructions. If you have problems or questions, contact your health care provider. What can I expect after the procedure? After the procedure, it is common to have:  A small amount of blood in your stool for 24 hours after the procedure.  Some gas.  Mild abdominal cramping or bloating. Follow these instructions at home: General instructions  For the first 24 hours after the procedure: ? Do not drive or use machinery. ? Do not sign important documents. ? Do not drink alcohol. ? Do your regular daily activities at a slower pace than normal. ? Eat soft, easy-to-digest foods.  Take over-the-counter or prescription medicines only as told by your health care provider. Relieving cramping and bloating   Try walking around when you have cramps or feel bloated.  Apply heat to your abdomen as told by your health care provider. Use a heat source that your health care provider recommends, such as a moist heat pack or a heating pad. ? Place a towel between your skin and the heat source. ? Leave the heat on for 20-30 minutes. ? Remove the heat if your skin turns bright red. This is especially important if you are unable to feel pain, heat, or cold. You may have a greater risk of getting burned. Eating and drinking   Drink enough fluid to keep your urine pale yellow.  Resume your normal diet as instructed by your health care provider. Avoid heavy or fried foods that are hard to digest.  Avoid drinking alcohol for as long as instructed by your health care provider. Contact a health care provider if:  You have blood in your stool 2-3 days after the procedure. Get help right away if:  You have more than a small spotting of blood in your stool.  You pass large blood clots in your stool.  Your abdomen is swollen.  You have nausea or vomiting.  You have a fever.  You have increasing abdominal pain that is not relieved with  medicine. Summary  After the procedure, it is common to have a small amount of blood in your stool. You may also have mild abdominal cramping and bloating.  For the first 24 hours after the procedure, do not drive or use machinery, sign important documents, or drink alcohol.  Contact your health care provider if you have a lot of blood in your stool, nausea or vomiting, a fever, or increased abdominal pain. This information is not intended to replace advice given to you by your health care provider. Make sure you discuss any questions you have with your health care provider. Document Released: 01/29/2004 Document Revised: 04/08/2017 Document Reviewed: 08/28/2015 Elsevier Interactive Patient Education  2019 [place] Anesthesia is a term that refers to techniques, procedures, and medicines that help a person stay safe and comfortable during a medical procedure. Monitored anesthesia care, or sedation, is one type of anesthesia. Your anesthesia specialist may recommend sedation if you will be having a procedure that does not require you to be unconscious, such as:  Cataract surgery.  A dental procedure.  A biopsy.  A colonoscopy. During the procedure, you may receive a medicine to help you relax (sedative). There are three levels of sedation:  Mild sedation. At this level, you may feel awake and relaxed. You will be able to follow directions.  Moderate sedation. At this level, you will be sleepy. You may not remember the procedure.  Deep sedation. At this level, you will be asleep. You will not remember the procedure. The more medicine you are given, the deeper your level of sedation will be. Depending on how you respond to the procedure, the anesthesia specialist may change your level of sedation or the type of anesthesia to fit your needs. An anesthesia specialist will monitor you closely during the procedure. Let your health care provider know  about:  Any allergies you have.  All medicines you are taking, including vitamins, herbs, eye drops, creams, and over-the-counter medicines.  Any use of steroids (by mouth or as a cream).  Any problems you or family members have had with sedatives  and anesthetic medicines.  Any blood disorders you have.  Any surgeries you have had.  Any medical conditions you have, such as sleep apnea.  Whether you are pregnant or may be pregnant.  Any use of cigarettes, alcohol, or street drugs. What are the risks? Generally, this is a safe procedure. However, problems may occur, including:  Getting too much medicine (oversedation).  Nausea.  Allergic reaction to medicines.  Trouble breathing. If this happens, a breathing tube may be used to help with breathing. It will be removed when you are awake and breathing on your own.  Heart trouble.  Lung trouble. Before the procedure Staying hydrated Follow instructions from your health care provider about hydration, which may include:  Up to 2 hours before the procedure - you may continue to drink clear liquids, such as water, clear fruit juice, black coffee, and plain tea. Eating and drinking restrictions Follow instructions from your health care provider about eating and drinking, which may include:  8 hours before the procedure - stop eating heavy meals or foods such as meat, fried foods, or fatty foods.  6 hours before the procedure - stop eating light meals or foods, such as toast or cereal.  6 hours before the procedure - stop drinking milk or drinks that contain milk.  2 hours before the procedure - stop drinking clear liquids. Medicines Ask your health care provider about:  Changing or stopping your regular medicines. This is especially important if you are taking diabetes medicines or blood thinners.  Taking medicines such as aspirin and ibuprofen. These medicines can thin your blood. Do not take these medicines before your  procedure if your health care provider instructs you not to. Tests and exams  You will have a physical exam.  You may have blood tests done to show: ? How well your kidneys and liver are working. ? How well your blood can clot. General instructions  Plan to have someone take you home from the hospital or clinic.  If you will be going home right after the procedure, plan to have someone with you for 24 hours.  What happens during the procedure?  Your blood pressure, heart rate, breathing, level of pain and overall condition will be monitored.  An IV tube will be inserted into one of your veins.  Your anesthesia specialist will give you medicines as needed to keep you comfortable during the procedure. This may mean changing the level of sedation.  The procedure will be performed. After the procedure  Your blood pressure, heart rate, breathing rate, and blood oxygen level will be monitored until the medicines you were given have worn off.  Do not drive for 24 hours if you received a sedative.  You may: ? Feel sleepy, clumsy, or nauseous. ? Feel forgetful about what happened after the procedure. ? Have a sore throat if you had a breathing tube during the procedure. ? Vomit. This information is not intended to replace advice given to you by your health care provider. Make sure you discuss any questions you have with your health care provider. Document Released: 03/12/2005 Document Revised: 11/23/2015 Document Reviewed: 10/07/2015 Elsevier Interactive Patient Education  2019 [place], Care After These instructions provide you with information about caring for yourself after your procedure. Your health care provider may also give you more specific instructions. Your treatment has been planned according to current medical practices, but problems sometimes occur. Call your health care provider if you have any problems or  questions after your  procedure. What can I expect after the procedure? After your procedure, you may:  Feel sleepy for several hours.  Feel clumsy and have poor balance for several hours.  Feel forgetful about what happened after the procedure.  Have poor judgment for several hours.  Feel nauseous or vomit.  Have a sore throat if you had a breathing tube during the procedure. Follow these instructions at home: For at least 24 hours after the procedure:      Have a responsible adult stay with you. It is important to have someone help care for you until you are awake and alert.  Rest as needed.  Do not: ? Participate in activities in which you could fall or become injured. ? Drive. ? Use heavy machinery. ? Drink alcohol. ? Take sleeping pills or medicines that cause drowsiness. ? Make important decisions or sign legal documents. ? Take care of children on your own. Eating and drinking  Follow the diet that is recommended by your health care provider.  If you vomit, drink water, juice, or soup when you can drink without vomiting.  Make sure you have little or no nausea before eating solid foods. General instructions  Take over-the-counter and prescription medicines only as told by your health care provider.  If you have sleep apnea, surgery and certain medicines can increase your risk for breathing problems. Follow instructions from your health care provider about wearing your sleep device: ? Anytime you are sleeping, including during daytime naps. ? While taking prescription pain medicines, sleeping medicines, or medicines that make you drowsy.  If you smoke, do not smoke without supervision.  Keep all follow-up visits as told by your health care provider. This is important. Contact a health care provider if:  You keep feeling nauseous or you keep vomiting.  You feel light-headed.  You develop a rash.  You have a fever. Get help right away if:  You have trouble  breathing. Summary  For several hours after your procedure, you may feel sleepy and have poor judgment.  Have a responsible adult stay with you for at least 24 hours or until you are awake and alert. This information is not intended to replace advice given to you by your health care provider. Make sure you discuss any questions you have with your health care provider. Document Released: 10/07/2015 Document Revised: 01/30/2017 Document Reviewed: 10/07/2015 Elsevier Interactive Patient Education  2019 [employer].

## 2018-07-26 NOTE — Telephone Encounter (Signed)
  William Kim, CMA  Freada Bergeron, CMA        Per [facility] @ [facility] patient does not require a PA for BIPAP trtration. Ok to schedule.

## 2018-07-26 NOTE — Telephone Encounter (Addendum)
Patient is scheduled for BiPAP Titration on 08/13/18. Patient understands his titration study will be done at [facility] sleep lab. Patient understands he will receive a letter in a week or so detailing appointment, date, time, and location. Patient understands to call if he does not receive the letter  in a timely manner. Patient agrees with treatment and thanked me for call.

## 2018-07-27 ENCOUNTER — Ambulatory Visit (HOSPITAL_COMMUNITY)
Admission: RE | Admit: 2018-07-27 | Discharge: 2018-07-27 | Disposition: A | Discharge status: Home / Self Care | Payer: 59 | Source: Ambulatory Visit | Attending: Internal Medicine | Admitting: Internal Medicine

## 2018-07-27 ENCOUNTER — Encounter (HOSPITAL_COMMUNITY): Payer: Self-pay

## 2018-07-27 DIAGNOSIS — Z01812 Encounter for preprocedural laboratory examination: Secondary | ICD-10-CM | POA: Diagnosis not present

## 2018-07-27 LAB — BASIC METABOLIC PANEL
ANION GAP: 7 (ref 5–15)
BUN: 19 mg/dL (ref 6–20)
CO2: 24 mmol/L (ref 22–32)
Calcium: 9.1 mg/dL (ref 8.9–10.3)
Chloride: 109 mmol/L (ref 98–111)
Creatinine, Ser: 1.1 mg/dL (ref 0.61–1.24)
GFR calc Af Amer: >60 mL/min (ref >60)
GFR calc non Af Amer: >60 mL/min (ref >60)
Glucose, Bld: 97 mg/dL (ref 70–99)
POTASSIUM: 3.6 mmol/L (ref 3.5–5.1)
Sodium: 140 mmol/L (ref 135–145)

## 2018-07-27 LAB — CBC
HCT: 39.8 % (ref 39.0–52.0)
Hemoglobin: 13 g/dL (ref 13.0–17.0)
MCH: 30.6 pg (ref 26.0–34.0)
MCHC: 32.7 g/dL (ref 30.0–36.0)
MCV: 93.6 fL (ref 80.0–100.0)
Platelets: 218 10*3/uL (ref 150–400)
RBC: 4.25 MIL/uL (ref 4.22–5.81)
RDW: 13.9 % (ref 11.5–15.5)
WBC: 5.8 10*3/uL (ref 4.0–10.5)
nRBC: 0 % (ref 0.0–0.2)

## 2018-07-30 ENCOUNTER — Encounter: Payer: Self-pay | Admitting: *Deleted

## 2018-08-02 ENCOUNTER — Ambulatory Visit (HOSPITAL_COMMUNITY)
Admission: RE | Admit: 2018-08-02 | Discharge: 2018-08-02 | Disposition: A | Discharge status: Home / Self Care | Payer: 59 | Attending: Internal Medicine | Admitting: Internal Medicine

## 2018-08-02 ENCOUNTER — Ambulatory Visit (HOSPITAL_COMMUNITY): Payer: 59 | Admitting: Anesthesiology

## 2018-08-02 ENCOUNTER — Encounter (HOSPITAL_COMMUNITY): Payer: Self-pay | Admitting: *Deleted

## 2018-08-02 ENCOUNTER — Encounter (HOSPITAL_COMMUNITY)
Admission: RE | Disposition: A | Discharge status: Home / Self Care | Payer: Self-pay | Source: Home / Self Care | Attending: Internal Medicine

## 2018-08-02 DIAGNOSIS — I4891 Unspecified atrial fibrillation: Secondary | ICD-10-CM | POA: Insufficient documentation

## 2018-08-02 DIAGNOSIS — I10 Essential (primary) hypertension: Secondary | ICD-10-CM | POA: Insufficient documentation

## 2018-08-02 DIAGNOSIS — Z1211 Encounter for screening for malignant neoplasm of colon: Secondary | ICD-10-CM | POA: Diagnosis present

## 2018-08-02 DIAGNOSIS — M5410 Radiculopathy, site unspecified: Secondary | ICD-10-CM | POA: Diagnosis not present

## 2018-08-02 DIAGNOSIS — Z87891 Personal history of nicotine dependence: Secondary | ICD-10-CM | POA: Insufficient documentation

## 2018-08-02 DIAGNOSIS — G4733 Obstructive sleep apnea (adult) (pediatric): Secondary | ICD-10-CM | POA: Insufficient documentation

## 2018-08-02 DIAGNOSIS — Z79899 Other long term (current) drug therapy: Secondary | ICD-10-CM | POA: Insufficient documentation

## 2018-08-02 DIAGNOSIS — M542 Cervicalgia: Secondary | ICD-10-CM | POA: Diagnosis not present

## 2018-08-02 DIAGNOSIS — E7800 Pure hypercholesterolemia, unspecified: Secondary | ICD-10-CM | POA: Diagnosis not present

## 2018-08-02 HISTORY — PX: COLONOSCOPY WITH PROPOFOL: SHX5780

## 2018-08-02 SURGERY — COLONOSCOPY WITH PROPOFOL
Anesthesia: Monitor Anesthesia Care

## 2018-08-02 MED ORDER — PROPOFOL 10 MG/ML IV BOLUS
INTRAVENOUS | Status: AC
  Filled 2018-08-02: qty 20

## 2018-08-02 MED ORDER — CHLORHEXIDINE GLUCONATE CLOTH 2 % EX PADS: 6.0000 | MEDICATED_PAD | Freq: Once | CUTANEOUS | Status: DC

## 2018-08-02 MED ORDER — LACTATED RINGERS IV SOLN
INTRAVENOUS | Status: DC
  Administered 2018-08-02: 12:00:00 via INTRAVENOUS

## 2018-08-02 MED ORDER — STERILE WATER FOR IRRIGATION IR SOLN
Status: DC | PRN
  Administered 2018-08-02: 100 mL

## 2018-08-02 MED ORDER — PROPOFOL 500 MG/50ML IV EMUL
INTRAVENOUS | Status: DC | PRN
  Administered 2018-08-02: 13:00:00 via INTRAVENOUS
  Administered 2018-08-02: 150 ug/kg/min via INTRAVENOUS

## 2018-08-02 MED ORDER — ONDANSETRON HCL 4 MG/2ML IJ SOLN
INTRAMUSCULAR | Status: DC | PRN
  Administered 2018-08-02: 4 mg via INTRAVENOUS

## 2018-08-02 NOTE — Op Note (Signed)
[facility] Patient Name: William Kim Procedure Date: 08/02/2018 11:59 AM MRN: [id number] Date of Birth: 1965-08-15 Attending MD: Norvel Richards , MD CSN: [id number] Age: 53 Admit Type: Outpatient Procedure:                Colonoscopy Indications:              Screening for colorectal malignant neoplasm Providers:                Norvel Richards, MD, Rosina Lowenstein, RN, Aram Candela Referring MD:              Medicines:                Propofol per Anesthesia Complications:            No immediate complications. Estimated Blood Loss:     Estimated blood loss: none. Procedure:                Pre-Anesthesia Assessment:                           - Prior to the procedure, a History and Physical                            was performed, and patient medications and                            allergies were reviewed. The patient's tolerance of                            previous anesthesia was also reviewed. The risks                            and benefits of the procedure and the sedation                            options and risks were discussed with the patient.                            All questions were answered, and informed consent                            was obtained. Prior Anticoagulants: The patient has                            taken no previous anticoagulant or antiplatelet                            agents. ASA Grade Assessment: III - A patient with                            severe systemic disease. After reviewing the risks  and benefits, the patient was deemed in                            satisfactory condition to undergo the procedure.                           After obtaining informed consent, the colonoscope                            was passed under direct vision. Throughout the                            procedure, the patient's blood pressure, pulse, and                            oxygen  saturations were monitored continuously. The                            CF-[id number] ([id number]) scope was introduced through                            the and advanced to the the cecum, identified by                            appendiceal orifice and ileocecal valve. The                            colonoscopy was performed without difficulty. The                            patient tolerated the procedure well. The quality                            of the bowel preparation was adequate. The                            ileocecal valve, appendiceal orifice, and rectum                            were photographed. The entire colon was well                            visualized. Scope In: 1:10:02 PM Scope Out: 1:20:51 PM Scope Withdrawal Time: 0 hours 7 minutes 54 seconds  Total Procedure Duration: 0 hours 10 minutes 49 seconds  Findings:      The perianal and digital rectal examinations were normal.      The colon (entire examined portion) appeared normal.      The retroflexed view of the distal rectum and anal verge was normal and       showed no anal or rectal abnormalities. Impression:               - The entire examined colon is normal.                           -  The distal rectum and anal verge are normal on                            retroflexion view.                           - No specimens collected. Moderate Sedation:      Moderate (conscious) sedation was personally administered by an       anesthesia professional. The following parameters were monitored: oxygen       saturation, heart rate, blood pressure, respiratory rate, EKG, adequacy       of pulmonary ventilation, and response to care. Recommendation:           - Patient has a contact number available for                            emergencies. The signs and symptoms of potential                            delayed complications were discussed with the                            patient. Return to normal activities tomorrow.                             Written discharge instructions were provided to the                            patient.                           - Resume previous diet.                           - Continue present medications.                           - Repeat colonoscopy in 10 years for screening                            purposes.                           - Return to GI office PRN. Procedure Code(s):        --- Professional ---                           [phone], Colonoscopy, flexible; diagnostic, including                            collection of specimen(s) by brushing or washing,                            when performed (separate procedure) Diagnosis Code(s):        --- Professional ---  Z12.11, Encounter for screening for malignant                            neoplasm of colon CPT copyright 2018 American Medical Association. All rights reserved. The codes documented in this report are preliminary and upon coder review may  be revised to meet current compliance requirements. Cristopher Estimable. Christel Bai, MD Norvel Richards, MD 08/02/2018 1:26:08 PM This report has been signed electronically. Number of Addenda: 0

## 2018-08-02 NOTE — H&P (Signed)
@LOGO @   Primary Care Physician:  Redmond School, MD Primary Gastroenterologist:  Dr. Gala Romney  Pre-Procedure History & Physical: HPI:  William Kim is a 53 y.o. male is here for a screening colonoscopy.  Bowel symptoms.  No family history of colon cancer.  No prior colonoscopy.  Past Medical History:  Diagnosis Date  . A-fib ([place])   . High cholesterol   . Hypertension   . OSA (obstructive sleep apnea) 05/31/2018   Severe OSA with AHI 79/h with significant oxygen desaturations as low as 58%.    Past Surgical History:  Procedure Laterality Date  . None      Prior to Admission medications   Medication Sig Start Date End Date Taking? Authorizing Provider  Ascorbic Acid (VITAMIN C PO) Take 1 tablet by mouth daily.   Yes [provider]  Cranberry 1000 MG CAPS Take 1 capsule by mouth daily.   Yes [provider]  diltiazem (CARDIZEM CD) 120 MG 24 hr capsule Take 1 capsule (120 mg total) by mouth at bedtime. 10/27/17  Yes Sherran Needs, NP  diltiazem (CARDIZEM CD) 180 MG 24 hr capsule Take 1 capsule (180 mg total) daily by mouth. 05/15/17  Yes Sherran Needs, NP  Omega-3 Fatty Acids (FISH OIL PO) Take 1 capsule by mouth daily.    Yes [provider]  simvastatin (ZOCOR) 20 MG tablet Take 20 mg by mouth daily.   Yes [provider]  valACYclovir (VALTREX) 500 MG tablet Take 1 tablet by mouth daily. 03/30/16  Yes [provider]    Allergies as of 07/02/2018  . (No Known Allergies)    Family History  Problem Relation Age of Onset  . Colon cancer Neg Hx   . Colon polyps Neg Hx     Social History   Socioeconomic History  . Marital status: Married    Spouse name: Not on file  . Number of children: Not on file  . Years of education: Not on file  . Highest education level: Not on file  Occupational History  . Not on file  Social Needs  . Financial resource strain: Not on file  . Food insecurity:    Worry: Not on file   Inability: Not on file  . Transportation needs:    Medical: Not on file    Non-medical: Not on file  Tobacco Use  . Smoking status: Former Research scientist (life sciences)  . Smokeless tobacco: Never Used  . Tobacco comment: quit smoking in 2012/2013, quit smoking August 16th  Substance and Sexual Activity  . Alcohol use: Yes    Comment: "1-2 beers and maybe a mixed drink a day"  . Drug use: No  . Sexual activity: Not on file  Lifestyle  . Physical activity:    Days per week: Not on file    Minutes per session: Not on file  . Stress: Not on file  Relationships  . Social connections:    Talks on phone: Not on file    Gets together: Not on file    Attends religious service: Not on file    Active member of club or organization: Not on file    Attends meetings of clubs or organizations: Not on file    Relationship status: Not on file  . Intimate partner violence:    Fear of current or ex partner: Not on file    Emotionally abused: Not on file    Physically abused: Not on file    Forced sexual activity:  Not on file  Other Topics Concern  . Not on file  Social History Narrative  . Not on file    Review of Systems: See HPI, otherwise negative ROS  Physical Exam: Pulse 75   Temp 97.6 F (36.4 C)  General:   Alert,  Well-developed, well-nourished, pleasant and cooperative in NAD Lungs:  Clear throughout to auscultation.   No wheezes, crackles, or rhonchi. No acute distress. Heart:  Regular rate and rhythm; no murmurs, clicks, rubs,  or gallops. Abdomen:  Soft, nontender and nondistended. No masses, hepatosplenomegaly or hernias noted. Normal bowel sounds, without guarding, and without rebound.   Msk:  Symmetrical without gross deformities. Normal posture.  Impression/Plan: William Kim is now here to undergo a screening colonoscopy.  First ever average rescreening colonoscopy  Risks, benefits, limitations, imponderables and alternatives regarding colonoscopy have been reviewed with the  patient. Questions have been answered. All parties agreeable.     Notice:  This dictation was prepared with Dragon dictation along with smaller phrase technology. Any transcriptional errors that result from this process are unintentional and may not be corrected upon review.

## 2018-08-02 NOTE — Transfer of Care (Signed)
Immediate Anesthesia Transfer of Care Note  Patient: William Kim  Procedure(s) Performed: COLONOSCOPY WITH PROPOFOL (N/A )  Patient Location: PACU  Anesthesia Type:MAC  Level of Consciousness: awake and patient cooperative  Airway & Oxygen Therapy: Patient Spontanous Breathing  Post-op Assessment: Report given to RN, Post -op Vital signs reviewed and stable and Patient moving all extremities  Post vital signs: Reviewed and stable  Last Vitals:  Vitals Value Taken Time  BP    Temp    Pulse    Resp    SpO2      Last Pain:  Vitals:   08/02/18 1321  PainSc: 0-No pain      Patients Stated Pain Goal: 4 (09/40/76 8088)  Complications: No apparent anesthesia complications

## 2018-08-02 NOTE — Discharge Instructions (Signed)
°  Colonoscopy Discharge Instructions  Read the instructions outlined below and refer to this sheet in the next few weeks. These discharge instructions provide you with general information on caring for yourself after you leave the hospital. Your doctor may also give you specific instructions. While your treatment has been planned according to the most current medical practices available, unavoidable complications occasionally occur. If you have any problems or questions after discharge, call Dr. Gala Romney at [phone]. ACTIVITY  You may resume your regular activity, but move at a slower pace for the next 24 hours.   Take frequent rest periods for the next 24 hours.   Walking will help get rid of the air and reduce the bloated feeling in your belly (abdomen).   No driving for 24 hours (because of the medicine (anesthesia) used during the test).    Do not sign any important legal documents or operate any machinery for 24 hours (because of the anesthesia used during the test).  NUTRITION  Drink plenty of fluids.   You may resume your normal diet as instructed by your doctor.   Begin with a light meal and progress to your normal diet. Heavy or fried foods are harder to digest and may make you feel sick to your stomach (nauseated).   Avoid alcoholic beverages for 24 hours or as instructed.  MEDICATIONS  You may resume your normal medications unless your doctor tells you otherwise.  WHAT YOU CAN EXPECT TODAY  Some feelings of bloating in the abdomen.   Passage of more gas than usual.   Spotting of blood in your stool or on the toilet paper.  IF YOU HAD POLYPS REMOVED DURING THE COLONOSCOPY:  No aspirin products for 7 days or as instructed.   No alcohol for 7 days or as instructed.   Eat a soft diet for the next 24 hours.  FINDING OUT THE RESULTS OF YOUR TEST Not all test results are available during your visit. If your test results are not back during the visit, make an appointment  with your caregiver to find out the results. Do not assume everything is normal if you have not heard from your caregiver or the medical facility. It is important for you to follow up on all of your test results.  SEEK IMMEDIATE MEDICAL ATTENTION IF:  You have more than a spotting of blood in your stool.   Your belly is swollen (abdominal distention).   You are nauseated or vomiting.   You have a temperature over 101.   You have abdominal pain or discomfort that is severe or gets worse throughout the day.    Repeat screening colonoscopy in 10 years

## 2018-08-02 NOTE — Anesthesia Preprocedure Evaluation (Signed)
Anesthesia Evaluation  Patient identified by MRN, date of birth, ID band Patient awake    Reviewed: Allergy & Precautions, H&P , NPO status , Patient's Chart, lab work & pertinent test results  Airway Mallampati: II  TM Distance: >3 FB Neck ROM: limited    Dental no notable dental hx.    Pulmonary sleep apnea and Continuous Positive Airway Pressure Ventilation , former smoker,    Pulmonary exam normal breath sounds clear to auscultation       Cardiovascular Exercise Tolerance: Good hypertension, negative cardio ROS   Rhythm:regular Rate:Normal     Neuro/Psych negative neurological ROS  negative psych ROS   GI/Hepatic negative GI ROS, Neg liver ROS,   Endo/Other  negative endocrine ROS  Renal/GU negative Renal ROS  negative genitourinary   Musculoskeletal   Abdominal   Peds  Hematology negative hematology ROS (+)   Anesthesia Other Findings C/O neck pain with RUE radiculopathy  Reproductive/Obstetrics negative OB ROS                             Anesthesia Physical Anesthesia Plan  ASA: II  Anesthesia Plan: MAC   Post-op Pain Management:    Induction:   PONV Risk Score and Plan:   Airway Management Planned:   Additional Equipment:   Intra-op Plan:   Post-operative Plan:   Informed Consent: I have reviewed the patients History and Physical, chart, labs and discussed the procedure including the risks, benefits and alternatives for the proposed anesthesia with the patient or authorized representative who has indicated his/her understanding and acceptance.     Dental Advisory Given  Plan Discussed with: CRNA  Anesthesia Plan Comments:         Anesthesia Quick Evaluation

## 2018-08-02 NOTE — Anesthesia Postprocedure Evaluation (Signed)
Anesthesia Post Note  Patient: William Kim  Procedure(s) Performed: COLONOSCOPY WITH PROPOFOL (N/A )  Patient location during evaluation: PACU Anesthesia Type: MAC Level of consciousness: awake and patient cooperative Pain management: pain level controlled Vital Signs Assessment: post-procedure vital signs reviewed and stable Respiratory status: spontaneous breathing, nonlabored ventilation and respiratory function stable Cardiovascular status: blood pressure returned to baseline Postop Assessment: no apparent nausea or vomiting Anesthetic complications: no     Last Vitals:  Vitals:   08/02/18 1114  Pulse: 75  Temp: 36.4 C    Last Pain:  Vitals:   08/02/18 1321  PainSc: 0-No pain                 Jurni Cesaro J

## 2018-08-06 ENCOUNTER — Encounter (HOSPITAL_COMMUNITY): Payer: Self-pay | Admitting: Internal Medicine

## 2018-08-13 ENCOUNTER — Ambulatory Visit: Payer: 59 | Attending: Cardiology | Admitting: Cardiology

## 2018-08-13 DIAGNOSIS — G4733 Obstructive sleep apnea (adult) (pediatric): Secondary | ICD-10-CM | POA: Insufficient documentation

## 2018-08-15 NOTE — Procedures (Signed)
Patient Name: William Kim, William Kim Date: 08/13/2018   Gender: Male  D.O.B: 02-Mar-1966  Age (years): 26  Referring Provider: Fransico Him MD, ABSM  Height (inches): 72  Interpreting Physician: Fransico Him MD, ABSM  Weight (lbs): 240  RPSGT: Peak, Robert  BMI: 33  MRN: [id number]  Neck Size: 17.00   CLINICAL INFORMATION  The patient is referred for a split night study with BPAP. Most recent polysomnogram dated 03/29/2017 revealed an AHI of 79.0/h and RDI of 79.0/h. Most recent titration study dated 03/29/2017 was optimal at 19cm H2O with an AHI of 30.0/h.   MEDICATIONS  Medications self-administered by patient taken the night of the study : N/A  SLEEP STUDY TECHNIQUE  As per the AASM Manual for the Scoring of Sleep and Associated Events v2.3 (April 2016) with a hypopnea requiring 4% desaturations. The channels recorded and monitored were frontal, central and occipital EEG, electrooculogram (EOG), submentalis EMG (chin), nasal and oral airflow, thoracic and abdominal wall motion, anterior tibialis EMG, snore microphone, electrocardiogram, and pulse oximetry. Bi-level positive airway pressure (BiPAP) was initiated when the patient met split night criteria and was titrated according to treat sleep-disordered breathing. RESPIRATORY PARAMETERS  Diagnostic Total AHI (/hr): 19.1 RDI (/hr): 22.2 OA Index (/hr): 9.7 CA Index (/hr): 0.0  REM AHI (/hr): 0.0 NREM AHI (/hr): 22.6 Supine AHI (/hr): 22.3 Non-supine AHI (/hr): 20.40  Min O2 Sat (%): 79.0 Mean O2 (%): 94.3 Time below 88% (min): 9.1      Titration Optimal IPAP Pressure (cm): 19 Optimal EPAP Pressure (cm): 14 AHI at Optimal Pressure (/hr): 0.0 Min O2 at Optimal Pressure (%): 87.0  Sleep % at Optimal (%): 79 Supine % at Optimal (%): 100           SLEEP ARCHITECTURE  The study was initiated at 9:54:19 PM and terminated at 4:27:27 AM. The total recorded time was 393.1 minutes. EEG confirmed total sleep time was 273 minutes  yielding a sleep efficiency of 69.4%. Sleep onset after lights out was 8.8 minutes with a REM latency of 148.0 minutes. The patient spent 12.3% of the night in stage N1 sleep, 72.2% in stage N2 sleep, 0.0% in stage N3 and 15.6% in REM. Wake after sleep onset (WASO) was 111.3 minutes. The Arousal Index was 21.8/hour.  LEG MOVEMENT DATA  The total Periodic Limb Movements of Sleep (PLMS) were 0. The PLMS index was 0.0 .  CARDIAC DATA  The 2 lead EKG demonstrated sinus rhythm. The mean heart rate was 100.0 beats per minute. Other EKG findings include: None.   IMPRESSIONS  - Moderate obstructive sleep apnea occurred during the diagnostic portion of the study (AHI = 19.1 /hour). An optimal PAP pressure was selected for this patient ( 19/14 cm of water) - No significant central sleep apnea occurred during the diagnostic portion of the study (CAI = 0.0/hour).  - No snoring was audible during this study.  - No cardiac abnormalities were noted during this study.  - Clinically significant periodic limb movements of sleep did not occur during the study.  DIAGNOSIS  - Obstructive Sleep Apnea (327.23 [G47.33 ICD-10])  RECOMMENDATIONS  - Trial of BiPAP therapy on 19/15 cm H2O with a Medium size Resmed Full Face Mask AirFit F30 mask and heated humidification. - Avoid alcohol, sedatives and other CNS depressants that may worsen sleep apnea and disrupt normal sleep architecture.  - Sleep hygiene should be reviewed to assess factors that may improve sleep quality.  - Weight management and regular  exercise should be initiated or continued.  - Return to Sleep Center for re-evaluation in 10 weeks.  [Electronically signed] 08/15/2018 10:07 PM Fransico Him MD, ABSM  Diplomate, American Board of Sleep Medicine

## 2018-08-20 ENCOUNTER — Telehealth: Payer: Self-pay | Admitting: *Deleted

## 2018-08-20 NOTE — Telephone Encounter (Signed)
-----   Message from Sueanne Margarita, MD sent at 08/15/2018 10:47 PM EST ----- Please let patient know that they have significant sleep apnea and had successful PAP titration and will be set up with PAP unit.  Please let DME know that order is in [facility].  Please set patient up for OV in 10 weeks

## 2018-08-24 NOTE — Telephone Encounter (Signed)
Informed patient of titration results and verbalized understanding was indicated. Patient understands his titration study showed they have significant sleep apnea and had successful PAP titration and will be set up with PAP unit.Order is in [place]. Please set patient up for OV in 10 weeks.  Upon patient request DME selection is [facility]. Patient understands he will be contacted by [facility] to set up his cpap. Patient understands to call if Huey Romans does not contact him with new setup in a timely manner. Patient understands they will be called once confirmation has been received from [facility] that they have received their new machine to schedule 10 week follow up appointment.  [facility] notified of new cpap order  Please add to airview Patient was grateful for the call and thanked me.

## 2018-08-27 ENCOUNTER — Other Ambulatory Visit (HOSPITAL_COMMUNITY): Payer: Self-pay | Admitting: Nurse Practitioner

## 2018-09-13 ENCOUNTER — Telehealth: Payer: Self-pay | Admitting: *Deleted

## 2018-09-13 NOTE — Telephone Encounter (Signed)
Called [facility] who states patient was set up on 09/09/18. The download I pulled was 08/01/18 - 08/30/18 which I now know that he was still on his cpap. I have pulled a download on the bipap for your viewing to be scanned, it only has 5 days usage but the setting are correct.

## 2018-09-13 NOTE — Telephone Encounter (Signed)
-----   Message from Sueanne Margarita, MD sent at 09/12/2018 11:41 PM EDT ----- Patient had BiPAP titratoin recently and is supposed to be on BiPAP at 19/15cm H2O - why does it appear on download that he is still on auto CPAP

## 2018-09-15 ENCOUNTER — Telehealth: Payer: Self-pay | Admitting: *Deleted

## 2018-09-15 NOTE — Telephone Encounter (Signed)
Informed patient of compliance results and verbalized understanding was indicated. Patient is aware and agreeable to AHI being within range at 3.6. Patient is aware and agreeable to improving in compliance with machine usage.

## 2018-09-15 NOTE — Telephone Encounter (Signed)
-----   Message from Sueanne Margarita, MD sent at 09/15/2018  4:53 PM EDT ----- Good AHI on PAP but needs to improve compliance

## 2018-09-16 ENCOUNTER — Telehealth: Payer: Self-pay | Admitting: Cardiology

## 2018-09-16 NOTE — Telephone Encounter (Signed)
Please get a download on current BiPAP settings

## 2018-09-16 NOTE — Telephone Encounter (Signed)
Pt says the CPAP machine is too strong. He called [facility], and they informed him that he would need to get in touch with Dr. Radford Pax to put in a work order to adjust the settings (turned down). There is too much pressure

## 2018-09-16 NOTE — Telephone Encounter (Signed)
Sent to be scanned Dr Radford Pax.

## 2018-09-21 ENCOUNTER — Telehealth: Payer: Self-pay | Admitting: *Deleted

## 2018-09-21 NOTE — Telephone Encounter (Signed)
Patient is aware and agreeable to AHI being high at 9.2. Initially the mask he received was a airfit N-20 mask. Then Per [facility] patient got a full face airfit F-30 medium size mask on 09/09/18.   Patient alternates from his back to his side.

## 2018-09-21 NOTE — Telephone Encounter (Signed)
-----   Message from Sueanne Margarita, MD sent at 09/20/2018 11:14 AM EDT ----- AHI too high - please find out what mask patient is wearing and if he is sleeping on his back

## 2018-09-21 NOTE — Telephone Encounter (Signed)
Pressure too strong, blowing his cheeks out and chokes him.  Pressure is set on 19/15. Patient states it is fine starting out but when he wakes up its too high. Please advise

## 2018-10-11 ENCOUNTER — Telehealth: Payer: Self-pay | Admitting: *Deleted

## 2018-10-11 DIAGNOSIS — G4733 Obstructive sleep apnea (adult) (pediatric): Secondary | ICD-10-CM

## 2018-10-11 NOTE — Telephone Encounter (Addendum)
Order faxed to [facility] today. DME CHANGED TO [facility].

## 2018-10-11 NOTE — Telephone Encounter (Signed)
-----   Message from Sueanne Margarita, MD sent at 09/21/2018  3:59 PM EDT ----- Regarding: RE: Pressure Lets try auto BIPAP set on IPAP max 20cm H2O, EPAP min 5cm H2O with PS 4cm H2O and get a download in 2 weeks  Traci ----- Message ----- From: Freada Bergeron, CMA Sent: 09/21/2018  10:13 AM EDT To: Sueanne Margarita, MD Subject: Pressure                                       Pressure too strong, blowing his cheeks out and chokes him.  Pressure is set on 19/15. Patient states it is fine starting out but when he wakes up its too high. Please advise

## 2018-10-12 NOTE — Telephone Encounter (Signed)
Patient notified and is aware and agreeable to treatment.

## 2018-10-22 ENCOUNTER — Other Ambulatory Visit (HOSPITAL_COMMUNITY): Payer: Self-pay | Admitting: Neurological Surgery

## 2018-10-22 DIAGNOSIS — M5412 Radiculopathy, cervical region: Secondary | ICD-10-CM

## 2018-11-30 ENCOUNTER — Other Ambulatory Visit: Payer: Self-pay

## 2018-11-30 ENCOUNTER — Ambulatory Visit (HOSPITAL_COMMUNITY)
Admission: RE | Admit: 2018-11-30 | Discharge: 2018-11-30 | Disposition: A | Discharge status: Home / Self Care | Payer: 59 | Source: Ambulatory Visit | Attending: Neurological Surgery | Admitting: Neurological Surgery

## 2018-11-30 DIAGNOSIS — M5412 Radiculopathy, cervical region: Secondary | ICD-10-CM | POA: Insufficient documentation

## 2018-12-03 ENCOUNTER — Telehealth: Payer: Self-pay

## 2018-12-03 NOTE — Telephone Encounter (Signed)
   Primary Cardiologist: No primary care provider on file.  Chart reviewed and patient contacted by phone today as part of pre-operative protocol coverage. Given past medical history and time since last visit, based on ACC/AHA guidelines, William Kim would be at acceptable risk for the planned procedure without further cardiovascular testing.   OK to hold aspirin pre op.   I will route this recommendation to the requesting party via Epic fax function and remove from pre-op pool.  Please call with questions.  Kerin Ransom, PA-C 12/03/2018, 4:21 PM

## 2018-12-03 NOTE — Telephone Encounter (Signed)
   [facility] Pre-operative Risk Assessment    Request for surgical clearance:  1. What type of surgery is being performed? ANTERIOR CERVICAL FUSION  2. When is this surgery scheduled? 12/10/2018  3. What type of clearance is required (medical clearance vs. Pharmacy clearance to hold med vs. Both)? PHARMACY  4. Are there any medications that need to be held prior to surgery and how long? ANY ANTICOAGULANTS  5. Practice name and name of physician performing surgery? [facility]   6. What is your office phone number [phone]   7.   What is your office fax number [phone]  8.   Anesthesia type (None, local, MAC, general) ? GENERAL   William Kim 12/03/2018, 3:59 PM  _________________________________________________________________   (provider comments below)

## 2019-02-23 ENCOUNTER — Telehealth (HOSPITAL_COMMUNITY): Payer: Self-pay | Admitting: *Deleted

## 2019-02-23 MED ORDER — DILTIAZEM HCL 30 MG PO TABS: ORAL_TABLET | ORAL | 1 refills | Status: DC

## 2019-02-23 NOTE — Telephone Encounter (Signed)
Patient called in stating having increased breakthrough afib. Episodes may last 20-30 minutes. He was wondering if something he could take in addition to his daily medications. Today for an episode he took an extra 180mg  of cardizem and it made the episode terminate. Discussed with Roderic Palau Np - will call in cardizem 30mg  tablets to take every 4 hours as needed for HR over 100. Encouraged pt to call back if having to use the PRN cardizem frequently as his daily medications may need to be adjusted. Pt verbalized understanding.

## 2019-02-24 ENCOUNTER — Emergency Department (HOSPITAL_COMMUNITY): Payer: 59

## 2019-02-24 ENCOUNTER — Other Ambulatory Visit: Payer: Self-pay

## 2019-02-24 ENCOUNTER — Encounter (HOSPITAL_COMMUNITY): Payer: Self-pay | Admitting: Emergency Medicine

## 2019-02-24 ENCOUNTER — Emergency Department (HOSPITAL_COMMUNITY)
Admission: EM | Admit: 2019-02-24 | Discharge: 2019-02-24 | Disposition: A | Discharge status: Home / Self Care | Payer: 59 | Attending: Emergency Medicine | Admitting: Emergency Medicine

## 2019-02-24 DIAGNOSIS — Z79899 Other long term (current) drug therapy: Secondary | ICD-10-CM | POA: Diagnosis not present

## 2019-02-24 DIAGNOSIS — Z87891 Personal history of nicotine dependence: Secondary | ICD-10-CM | POA: Diagnosis not present

## 2019-02-24 DIAGNOSIS — R0602 Shortness of breath: Secondary | ICD-10-CM | POA: Diagnosis present

## 2019-02-24 DIAGNOSIS — R002 Palpitations: Secondary | ICD-10-CM

## 2019-02-24 DIAGNOSIS — I10 Essential (primary) hypertension: Secondary | ICD-10-CM | POA: Insufficient documentation

## 2019-02-24 DIAGNOSIS — Z20828 Contact with and (suspected) exposure to other viral communicable diseases: Secondary | ICD-10-CM | POA: Insufficient documentation

## 2019-02-24 DIAGNOSIS — J069 Acute upper respiratory infection, unspecified: Secondary | ICD-10-CM | POA: Insufficient documentation

## 2019-02-24 LAB — COMPREHENSIVE METABOLIC PANEL
ALT: 45 U/L — ABNORMAL HIGH (ref 0–44)
AST: 40 U/L (ref 15–41)
Albumin: 4.3 g/dL (ref 3.5–5.0)
Alkaline Phosphatase: 91 U/L (ref 38–126)
Anion gap: 9 (ref 5–15)
BUN: 11 mg/dL (ref 6–20)
CO2: 23 mmol/L (ref 22–32)
Calcium: 9.5 mg/dL (ref 8.9–10.3)
Chloride: 109 mmol/L (ref 98–111)
Creatinine, Ser: 0.85 mg/dL (ref 0.61–1.24)
GFR calc Af Amer: >60 mL/min (ref >60)
GFR calc non Af Amer: >60 mL/min (ref >60)
Glucose, Bld: 99 mg/dL (ref 70–99)
Potassium: 3.6 mmol/L (ref 3.5–5.1)
Sodium: 141 mmol/L (ref 135–145)
Total Bilirubin: 0.9 mg/dL (ref 0.3–1.2)
Total Protein: 7.2 g/dL (ref 6.5–8.1)

## 2019-02-24 LAB — TSH: TSH: 0.936 u[IU]/mL (ref 0.350–4.500)

## 2019-02-24 LAB — CBC WITH DIFFERENTIAL/PLATELET
Abs Immature Granulocytes: 0.01 10*3/uL (ref 0.00–0.07)
Basophils Absolute: 0 10*3/uL (ref 0.0–0.1)
Basophils Relative: 0 %
Eosinophils Absolute: 0 10*3/uL (ref 0.0–0.5)
Eosinophils Relative: 1 %
HCT: 40.4 % (ref 39.0–52.0)
Hemoglobin: 13.2 g/dL (ref 13.0–17.0)
Immature Granulocytes: 0 %
Lymphocytes Relative: 31 %
Lymphs Abs: 1.4 10*3/uL (ref 0.7–4.0)
MCH: 29.8 pg (ref 26.0–34.0)
MCHC: 32.7 g/dL (ref 30.0–36.0)
MCV: 91.2 fL (ref 80.0–100.0)
Monocytes Absolute: 0.6 10*3/uL (ref 0.1–1.0)
Monocytes Relative: 12 %
Neutro Abs: 2.7 10*3/uL (ref 1.7–7.7)
Neutrophils Relative %: 56 %
Platelets: 214 10*3/uL (ref 150–400)
RBC: 4.43 MIL/uL (ref 4.22–5.81)
RDW: 15.2 % (ref 11.5–15.5)
WBC: 4.7 10*3/uL (ref 4.0–10.5)
nRBC: 0 % (ref 0.0–0.2)

## 2019-02-24 LAB — TROPONIN I (HIGH SENSITIVITY): Troponin I (High Sensitivity): 6 ng/L (ref <18)

## 2019-02-24 LAB — SARS CORONAVIRUS 2 (TAT 6-24 HRS): SARS Coronavirus 2: NEGATIVE

## 2019-02-24 MED ORDER — HYDROXYZINE HCL 25 MG PO TABS: 25.0000 mg | ORAL_TABLET | Freq: Four times a day (QID) | ORAL | 0 refills | Status: DC | PRN

## 2019-02-24 MED ORDER — LORAZEPAM 1 MG PO TABS
1.0000 mg | ORAL_TABLET | Freq: Once | ORAL | Status: AC
  Administered 2019-02-24: 1 mg via ORAL
  Filled 2019-02-24: qty 1

## 2019-02-24 NOTE — Discharge Instructions (Signed)
You were seen in the emergency department today with heart palpitations, shortness of breath, upper respiratory tract infection symptoms.  I am testing for COVID-19.  Please stay isolated at home, wear a mask, keep distance, wash your hands.  You should get the results from this test in 1 to 3 days.  You can check the results on MyChart.  Please stay isolated until your symptoms resolve and you have a negative test result.  Take your diltiazem as prescribed.  You can try the Atarax as needed for these symptoms if your heart rate is not over 100.  If you develop any new or suddenly worsening symptoms you should return to the emergency department for evaluation.

## 2019-02-24 NOTE — ED Triage Notes (Signed)
Patient complains of shortness of breath x 5 days with chills. States was mowing yesterday and SOB became worse. NAD in triage.

## 2019-02-24 NOTE — ED Provider Notes (Signed)
Emergency Department Provider Note   I have reviewed the triage vital signs and the nursing notes.   HISTORY  Chief Complaint Shortness of Breath   HPI William Kim is a 53 y.o. male with PMH of A-fib, HLD, and HTN presents to the emergency department for evaluation of shortness of breath, heart palpitations, and chest discomfort.  Patient states he developed chills and more mild shortness of breath last week.  He has some mild sore throat and nasal congestion.  Denies headache.  Yesterday, he felt sudden, more severe shortness of breath with heart palpitations.  He was not sure if he was having an anxiety attack or possible A. fib episode.  He had already taken his 180 mg diltiazem XR that AM but took an additional 180 mg XR tab around noon.  He states his symptoms improved after that but returned somewhat this morning.  His cardiologist prescribed an immediate release diltiazem 30 mg tab to take PRN in the future but hasn't taken that yet today. No fever. No productive cough.   Past Medical History:  Diagnosis Date  . A-fib ([place])   . High cholesterol   . Hypertension   . OSA (obstructive sleep apnea) 05/31/2018   Severe OSA with AHI 79/h with significant oxygen desaturations as low as 58%.    Patient Active Problem List   Diagnosis Date Noted  . Encounter for screening colonoscopy 07/02/2018  . OSA (obstructive sleep apnea) 05/31/2018  . Benign essential HTN 05/31/2018  . Obesity (BMI 30-39.9) 05/31/2018    Past Surgical History:  Procedure Laterality Date  . COLONOSCOPY WITH PROPOFOL N/A 08/02/2018   Procedure: COLONOSCOPY WITH PROPOFOL;  Surgeon: Daneil Dolin, MD;  [facility];  Service: Endoscopy;  Laterality: N/A;  12:15pm  . None      Allergies Patient has no known allergies.  Family History  Problem Relation Age of Onset  . Colon cancer Neg Hx   . Colon polyps Neg Hx     Social History Social History   Tobacco Use  . Smoking status:  Former Research scientist (life sciences)  . Smokeless tobacco: Never Used  . Tobacco comment: quit smoking in 2012/2013, quit smoking August 16th  Substance Use Topics  . Alcohol use: Yes    Comment: "1-2 beers and maybe a mixed drink a day"  . Drug use: No    Review of Systems  Constitutional: No fever/chills Eyes: No visual changes. ENT: Positive sore throat and congestion.  Cardiovascular: Positive chest pain/palpitations.  Respiratory: Positive shortness of breath and cough.  Gastrointestinal: No abdominal pain.  No nausea, no vomiting.  No diarrhea.  No constipation. Genitourinary: Negative for dysuria. Musculoskeletal: Negative for back pain. Skin: Negative for rash. Neurological: Negative for headaches, focal weakness or numbness.  10-point ROS otherwise negative.  ____________________________________________   PHYSICAL EXAM:  VITAL SIGNS: ED Triage Vitals  Enc Vitals Group     BP 02/24/19 0958 133/90     Pulse Rate 02/24/19 0958 80     Resp 02/24/19 0958 20     Temp 02/24/19 0958 98.8 F (37.1 C)     Temp Source 02/24/19 0958 Oral     SpO2 02/24/19 0958 100 %     Weight 02/24/19 0958 209 lb (94.8 kg)     Height 02/24/19 0958 6' (1.829 m)   Constitutional: Alert and oriented. Well appearing and in no acute distress. Eyes: Conjunctivae are normal. Head: Atraumatic. Nose: No congestion/rhinnorhea. Mouth/Throat: Mucous membranes are moist.  Neck:  No stridor.  Cardiovascular: Normal rate, regular rhythm. Good peripheral circulation. Grossly normal heart sounds.   Respiratory: Normal respiratory effort.  No retractions. Lungs CTAB. Gastrointestinal: Soft and nontender. No distention.  Musculoskeletal: No lower extremity tenderness nor edema. No gross deformities of extremities. Neurologic:  Normal speech and language. No gross focal neurologic deficits are appreciated.  Skin:  Skin is warm, dry and intact. No rash noted.  ____________________________________________   LABS (all labs  ordered are listed, but only abnormal results are displayed)  Labs Reviewed  COMPREHENSIVE METABOLIC PANEL - Abnormal; Notable for the following components:      Result Value   ALT 45 (*)    All other components within normal limits  SARS CORONAVIRUS 2  CBC WITH DIFFERENTIAL/PLATELET  TSH  TROPONIN I (HIGH SENSITIVITY)   ____________________________________________  EKG   EKG Interpretation  Date/Time:  Thursday February 24 2019 09:56:32 EDT Ventricular Rate:  81 PR Interval:    QRS Duration: 86 QT Interval:  378 QTC Calculation: 439 R Axis:   54 Text Interpretation:  Sinus rhythm Supraventricular bigeminy Consider left ventricular hypertrophy No STEMI  Confirmed by Nanda Quinton [phone]) on 02/24/2019 10:22:09 AM       ____________________________________________  RADIOLOGY  Dg Chest Portable 1 View  Result Date: 02/24/2019 CLINICAL DATA:  Cough and chest pain.  Shortness of breath EXAM: PORTABLE CHEST 1 VIEW COMPARISON:  January 27, 2017 FINDINGS: There is no edema or consolidation. The heart size and pulmonary vascularity are normal. No adenopathy. No pneumothorax. Postoperative changes noted in the lower cervical region. IMPRESSION: No edema or consolidation.  Cardiac silhouette within normal limits. Electronically Signed   By: Lowella Grip III M.D.   On: 02/24/2019 11:04    ____________________________________________   PROCEDURES  Procedure(s) performed:   Procedures  None ____________________________________________   INITIAL IMPRESSION / ASSESSMENT AND PLAN / ED COURSE  Pertinent labs & imaging results that were available during my care of the patient were reviewed by me and considered in my medical decision making (see chart for details).   Patient presents to the emergency department for evaluation of shortness of breath, cough, palpitations.  Some mild chest discomfort in the center of the chest which is nonradiating.  No pleuritic pain.  Suspicion for  PE is very low.  Discussed his home medications and the danger of taking to extended release 180 mg tablets of diltiazem and a single day.  His heart rate here is normal and in sinus rhythm.  Plan for COVID-19 testing as well as chest x-ray, troponin, screening labs. Feeling palpitations and anxiety type symptoms. Will give ativan as well.   Patient improved with ativan. Labs unremarkable. COVID testing sent. Patient to follow results on MyChart. Plan to remain isolated until feeling better and has negative result. Will f/u with PCP. Discussed ED return precautions.   [name] was evaluated in Emergency Department on 02/24/2019 for the symptoms described in the history of present illness. He was evaluated in the context of the global COVID-19 pandemic, which necessitated consideration that the patient might be at risk for infection with the SARS-CoV-2 virus that causes COVID-19. Institutional protocols and algorithms that pertain to the evaluation of patients at risk for COVID-19 are in a state of rapid change based on information released by regulatory bodies including the CDC and federal and state organizations. These policies and algorithms were followed during the patient's care in the ED.  ____________________________________________  FINAL CLINICAL IMPRESSION(S) / ED DIAGNOSES  Final  diagnoses:  Palpitations  Viral upper respiratory tract infection     MEDICATIONS GIVEN DURING THIS VISIT:  Medications  LORazepam (ATIVAN) tablet 1 mg (1 mg Oral Given 02/24/19 1106)     NEW OUTPATIENT MEDICATIONS STARTED DURING THIS VISIT:  Discharge Medication List as of 02/24/2019 12:46 PM    START taking these medications   Details  hydrOXYzine (ATARAX/VISTARIL) 25 MG tablet Take 1 tablet (25 mg total) by mouth every 6 (six) hours as needed for anxiety., Starting Thu 02/24/2019, Normal        Note:  This document was prepared using Dragon voice recognition software and may include  unintentional dictation errors.  Nanda Quinton, MD Emergency Medicine    , Wonda Olds, MD 02/24/19 [phone]

## 2019-03-21 ENCOUNTER — Ambulatory Visit (INDEPENDENT_AMBULATORY_CARE_PROVIDER_SITE_OTHER): Payer: 59 | Admitting: Otolaryngology

## 2019-03-21 DIAGNOSIS — D37030 Neoplasm of uncertain behavior of the parotid salivary glands: Secondary | ICD-10-CM | POA: Diagnosis not present

## 2019-03-22 ENCOUNTER — Ambulatory Visit (HOSPITAL_COMMUNITY): Payer: 59 | Attending: Neurological Surgery | Admitting: Physical Therapy

## 2019-03-22 ENCOUNTER — Other Ambulatory Visit: Payer: Self-pay

## 2019-03-22 ENCOUNTER — Encounter (HOSPITAL_COMMUNITY): Payer: Self-pay | Admitting: Physical Therapy

## 2019-03-22 DIAGNOSIS — R29898 Other symptoms and signs involving the musculoskeletal system: Secondary | ICD-10-CM | POA: Insufficient documentation

## 2019-03-22 DIAGNOSIS — M542 Cervicalgia: Secondary | ICD-10-CM | POA: Insufficient documentation

## 2019-03-22 NOTE — Therapy (Signed)
[place], [place], [address] Phone: [phone]   Fax:  [phone]  Physical Therapy Evaluation  Patient Details  Name: William Kim MRN: [id number] Date of Birth: 1965-07-19 Referring Provider (PT): Eustace Moore, MD    Encounter Date: 03/22/2019  PT End of Session - 03/22/19 1808    Visit Number  1    Number of Visits  12    Date for PT Re-Evaluation  05/03/19   Mini re-assess 03/13/19   Authorization Type  Generic [employer] 60 days 0 used All therapies    Authorization Time Period  03/22/19 - 05/03/19    Authorization - Visit Number  1    Authorization - Number of Visits  60    PT Start Time  0905    PT Stop Time  0945    PT Time Calculation (min)  40 min    Activity Tolerance  Patient tolerated treatment well;No increased pain    Behavior During Therapy  WFL for tasks assessed/performed       Past Medical History:  Diagnosis Date  . A-fib ([place])   . High cholesterol   . Hypertension   . OSA (obstructive sleep apnea) 05/31/2018   Severe OSA with AHI 79/h with significant oxygen desaturations as low as 58%.    Past Surgical History:  Procedure Laterality Date  . COLONOSCOPY WITH PROPOFOL N/A 08/02/2018   Procedure: COLONOSCOPY WITH PROPOFOL;  Surgeon: Daneil Dolin, MD;  [facility];  Service: Endoscopy;  Laterality: N/A;  12:15pm  . None      There were no vitals filed for this visit.   Subjective Assessment - 03/22/19 0913    Subjective  Patient reported that he recently had a surgery on December 10, 2018 by Dr. Ronnald Ramp. He reported that this surgery was done with the anterior approach and that he thinks it was a fusion of C6-C7. He stated that he had a pinched nerve. He reported that he is still feeling similar symptoms to what he was experiencing prior to the surgery. Patient reported that he has been experiencing pain down his right shoulder and down his spine and tingling and numbness in  left arm into his 5th digit and 4th digit. He reported that he has noticed that following the surgery he has found it difficult to eat and he feels that he has a "frog" in his throat at times, and occasionally has shortness of breath and he believes it may have something to do with the anesthesia. Patient reported that prior to his surgery he had pain in his neck starting in September 2019. He said that since the surgery the pain intensity has decreased, but still occurs. In addition, he reported that turning his head to the right side aggravates his pain more than turning to the left side. He denied any bowel and bladder changes. He did report a history of A-Fib which he said is controlled as well as having anxiety particularly about healthcare appointments.    Pertinent History  Cervical Surgery 12/10/18    Limitations  Lifting;House hold activities    Diagnostic tests  Negative Chest X-ray    Patient Stated Goals  Decreased pain in shoulder and neck    Currently in Pain?  Yes    Pain Score  6     Pain Location  Neck    Pain Orientation  Right    Pain Descriptors / Indicators  Dull  Pain Type  Chronic pain    Pain Radiating Towards  Down right shoulder and tingling numbness down left arm    Pain Onset  More than a month ago    Pain Frequency  Constant    Aggravating Factors   Unknown    Pain Relieving Factors  Pain medicine    Effect of Pain on Daily Activities  Moderately affects         [facility] PT Assessment - 03/22/19 0001      Assessment   Medical Diagnosis  Cervicalgia    Referring Provider (PT)  Eustace Moore, MD     Onset Date/Surgical Date  12/10/18    Hand Dominance  Left    Next MD Visit  04/28/19    Prior Therapy  None      Precautions   Precautions  None      [place] residence    Living Arrangements  Spouse/significant other;Children    Type of [place] to enter    Entrance Stairs-Number of Steps  --    a few     Prior Function   Level of Independence  Independent;Independent with basic ADLs    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), on medical leave currently      Cognition   Overall Cognitive Status  Within Functional Limits for tasks assessed      Observation/Other Assessments   Focus on Therapeutic Outcomes (FOTO)   46% limited      Sensation   Light Touch  Appears Intact      Functional Tests   Functional tests  Other      Other:   Other/ Comments  Shoulder height lifting of unweighted small box: 1 repetiton onset of increased right shoulder pain      Posture/Postural Control   Posture/Postural Control  Postural limitations    Postural Limitations  Rounded Shoulders;Forward head      Tone   Assessment Location  --      Deep Tendon Reflexes   DTR Assessment Site  Triceps    Triceps DTR  1+      ROM / Strength   AROM / PROM / Strength  AROM;Strength      AROM   AROM Assessment Site  Cervical    Cervical Flexion  45    Cervical Extension  35   Painful   Cervical - Right Side Bend  20   painful   Cervical - Left Side Bend  35   Painful   Cervical - Right Rotation  44   Painful   Cervical - Left Rotation  44      Strength   Overall Strength Comments  Shoulder, elbow wrist WFL      Palpation   Palpation comment  Patient denied tenderness to palpation of bilateral trapezius levator scapulae, or along spinous process of cervical spine. Some muscular restrictions felt in bilateral traps and levator.                Objective measurements completed on examination: See above findings.              PT Education - 03/22/19 1807    Education Details  Discussed POC, examination findings, and performing gentle cervical flexion/extension and rotation for HEP.    Person(s) Educated  Patient    Methods  Explanation;Demonstration    Comprehension  Verbalized understanding       PT Short Term Goals - 03/22/19 1810       PT SHORT TERM GOAL #1   Title  Patient will report understanding and regular compliance with HEP to improve cervical ROM, decrease pain, and improve overall functional mobility.    Time  3    Period  Weeks    Status  New    Target Date  04/12/19      PT SHORT TERM GOAL #2   Title  Patient will report a decrease in pain intensity to no more than a 4/10 over the course of a 1 week period for  improved tolerance to daily activities and to return to work.    Time  3    Period  Weeks    Status  New    Target Date  04/12/19        PT Long Term Goals - 03/22/19 1812      PT LONG TERM GOAL #1   Title  Patient will report a decrease in pain intensity to no more than a 2/10 over the course of a 1 week period for  improved tolerance to daily activities and to return to work.    Time  6    Period  Weeks    Status  New    Target Date  05/03/19      PT LONG TERM GOAL #2   Title  Patient will demonstrate cervical AROM WFL and with minimal to no reports of pain in order to interact with environment more safely and with decreased irritibility of symptoms.    Time  6    Period  Weeks    Status  New    Target Date  05/03/19      PT LONG TERM GOAL #3   Title  Patient will demonstrate ability to perform shoulder height lifting of box for 10 repetitions without pain to improve endurance to return to work and household chores.    Time  6    Period  Weeks    Status  New    Target Date  05/03/19             Plan - 03/22/19 1824    Clinical Impression Statement  Patient is a 53 year old male who presented to outpatient physical therapy with primary complaints of neck and radiating pain into upper extremities. Patient did report a history of a cervical surgery on 12/10/18 of anterior approach although he was not certain of the levels of the surgery and this information was not found in the chart or in the referral, and therapist plans to follow up with the physician regarding this. In  addition, patient reported residual deficits of swallowing, breathing, and chest discomfort following the surgery which he believes may be due to anesthesia which therapist plans to follow-up with referring physician regarding best plan of action to address this. During examination, noted decreased cervical AROM as well as pain brought on most significantly with cervical extension and right sided cervical rotation. 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 Patient would benefit from continued skilled physical therapy in order to address the abovementioned deficits and help patient return to his prior level of function.    Personal Factors and Comorbidities  Comorbidity 2    Comorbidities  Anxiety, A-Fib    Examination-Activity Limitations  Lift;Reach Overhead    Examination-Participation Restrictions  Community Activity;Yard Work;Meal Prep;Driving    Stability/Clinical Decision Making  Stable/Uncomplicated    Clinical Decision Making  Low    Rehab Potential  Good    PT Frequency  2x / week    PT Duration  6 weeks    PT Treatment/Interventions  ADLs/Self Care Home Management;Cryotherapy;Electrical Stimulation;Moist Heat;Traction;Functional mobility training;Therapeutic activities;Therapeutic exercise;Neuromuscular re-education;Patient/family education;Manual techniques;Scar mobilization;Passive range of motion;Energy conservation;Taping    PT Next Visit Plan  Follow-up MD regarding swallowing issues and which type of surgery. Review evalaution/goals. Assess anterior musculature for tightness/adhesions. Continue gentle cervical AROM. Manual including gentle thoracic joint mobilizations to decrease pain and soft tissue mobilization as needed to decrease restrictions/pain.    PT Home Exercise Plan  03/22/19: Cervical Rotation, flexion/extensions x10 1x/day    Consulted and Agree with Plan of Care  Patient       Patient  will benefit from skilled therapeutic intervention in order to improve the following deficits and impairments:  Increased fascial restricitons, Impaired sensation, Pain, Decreased mobility, Postural dysfunction, Decreased activity tolerance, Decreased endurance, Decreased range of motion, Decreased strength, Hypomobility, Impaired UE functional use  Visit Diagnosis: Cervicalgia  Other symptoms and signs involving the musculoskeletal system     Problem List Patient Active Problem List   Diagnosis Date Noted  . Encounter for screening colonoscopy 07/02/2018  . OSA (obstructive sleep apnea) 05/31/2018  . Benign essential HTN 05/31/2018  . Obesity (BMI 30-39.9) 05/31/2018   Clarene Critchley PT, DPT 6:30 PM, 03/22/19 [place], [place], [address] Phone: [phone]   Fax:  [phone]  Name: William Kim MRN: [id number] Date of Birth: 1966-06-25

## 2019-03-23 ENCOUNTER — Other Ambulatory Visit (HOSPITAL_COMMUNITY): Payer: Self-pay | Admitting: Otolaryngology

## 2019-03-23 ENCOUNTER — Telehealth (HOSPITAL_COMMUNITY): Payer: Self-pay | Admitting: Physical Therapy

## 2019-03-23 DIAGNOSIS — K118 Other diseases of salivary glands: Secondary | ICD-10-CM

## 2019-03-23 NOTE — Telephone Encounter (Signed)
Rebecca from Dr. Stevenson Clinch office called and reported that the patient had an ACDF at levels C6-7 and C7-T1. In addition reported that she plans to follow-up with the MD regarding patient's complaints of swallowing and breathing and will call back.  Clarene Critchley PT, DPT 5:29 PM, 03/23/19 [phone]

## 2019-03-23 NOTE — Telephone Encounter (Signed)
Called patient's referring provider and left a message asking for the type of surgery patient had in June as well as giving information regarding patient's subjective complaints of swallowing difficulty and SOB and recommending they consider an additional referral to another healthcare provider.   Clarene Critchley PT, DPT 3:57 PM, 03/23/19 [phone]

## 2019-03-30 ENCOUNTER — Encounter (HOSPITAL_COMMUNITY): Payer: Self-pay | Admitting: Physical Therapy

## 2019-03-30 ENCOUNTER — Ambulatory Visit (HOSPITAL_COMMUNITY): Payer: 59 | Admitting: Physical Therapy

## 2019-03-30 ENCOUNTER — Other Ambulatory Visit: Payer: Self-pay

## 2019-03-30 DIAGNOSIS — M542 Cervicalgia: Secondary | ICD-10-CM

## 2019-03-30 DIAGNOSIS — R29898 Other symptoms and signs involving the musculoskeletal system: Secondary | ICD-10-CM

## 2019-03-30 NOTE — Therapy (Signed)
[place], [place], [address] Phone: [phone]   Fax:  [phone]  Physical Therapy Treatment  Patient Details  Name: William Kim MRN: [id number] Date of Birth: 1965-12-06 Referring Provider (PT): Eustace Moore, MD    Encounter Date: 03/30/2019  PT End of Session - 03/30/19 1254    Visit Number  2    Number of Visits  12    Date for PT Re-Evaluation  05/03/19   Mini re-assess 03/13/19   Authorization Type  Generic [employer] 60 days 0 used All therapies    Authorization Time Period  03/22/19 - 05/03/19    Authorization - Visit Number  2    Authorization - Number of Visits  60    PT Start Time  [id number]    PT Stop Time  1200    PT Time Calculation (min)  42 min    Activity Tolerance  Patient tolerated treatment well;No increased pain    Behavior During Therapy  WFL for tasks assessed/performed       Past Medical History:  Diagnosis Date  . A-fib ([place])   . High cholesterol   . Hypertension   . OSA (obstructive sleep apnea) 05/31/2018   Severe OSA with AHI 79/h with significant oxygen desaturations as low as 58%.    Past Surgical History:  Procedure Laterality Date  . COLONOSCOPY WITH PROPOFOL N/A 08/02/2018   Procedure: COLONOSCOPY WITH PROPOFOL;  Surgeon: Daneil Dolin, MD;  [facility];  Service: Endoscopy;  Laterality: N/A;  12:15pm  . None      There were no vitals filed for this visit.  Subjective Assessment - 03/30/19 1122    Subjective  Patient reported that his neck pain has been ongoing and his swallowing issues have been improving but still aggravating him.    Pertinent History  Cervical Surgery 12/10/18    Limitations  Lifting;House hold activities    Diagnostic tests  Negative Chest X-ray    Patient Stated Goals  Decreased pain in shoulder and neck    Pain Onset  More than a month ago                       [facility] Adult PT Treatment/Exercise - 03/30/19 0001       Exercises   Exercises  Neck      Neck Exercises: Seated   Cervical Rotation  Right;Left;10 reps    Cervical Rotation Limitations  Following manual      Manual Therapy   Manual Therapy  Joint mobilization;Soft tissue mobilization;Passive ROM    Manual therapy comments  All manual was completed separately from other skilled interventions    Joint Mobilization  Patient prone: PA grade II-III joint mobilization to T2-9 20-30 second oscillations for pain relief    Soft tissue mobilization  Seated to patient's upper trapezius and levator scapulae Rt>Lt    Passive ROM  Prone: cervical rotation bilaterally             PT Education - 03/30/19 1252    Education Details  Discussed purpose and technique of interventions throughout session.    Person(s) Educated  Patient    Methods  Explanation    Comprehension  Verbalized understanding       PT Short Term Goals - 03/30/19 1124      PT SHORT TERM GOAL #1   Title  Patient will report understanding and regular compliance with HEP to  improve cervical ROM, decrease pain, and improve overall functional mobility.    Time  3    Period  Weeks    Status  On-going    Target Date  04/12/19      PT SHORT TERM GOAL #2   Title  Patient will report a decrease in pain intensity to no more than a 4/10 over the course of a 1 week period for  improved tolerance to daily activities and to return to work.    Time  3    Period  Weeks    Status  On-going    Target Date  04/12/19        PT Long Term Goals - 03/30/19 1125      PT LONG TERM GOAL #1   Title  Patient will report a decrease in pain intensity to no more than a 2/10 over the course of a 1 week period for  improved tolerance to daily activities and to return to work.    Time  6    Period  Weeks    Status  On-going      PT LONG TERM GOAL #2   Title  Patient will demonstrate cervical AROM WFL and with minimal to no reports of pain in order to interact with environment more safely and  with decreased irritibility of symptoms.    Time  6    Period  Weeks    Status  On-going      PT LONG TERM GOAL #3   Title  Patient will demonstrate ability to perform shoulder height lifting of box for 10 repetitions without pain to improve endurance to return to work and household chores.    Time  6    Period  Weeks    Status  On-going            Plan - 03/30/19 1302    Clinical Impression Statement  This session began by reviewing patient's evaluation and goals with patient reporting understanding and consent to goals. The main focus of this session was decreasing patient's pain primarily through manual therapy. Began with thoracic spine mobilizations to patient's tolerance and then performed cervical rotation PROM and soft tissue mobilization again to decrease pain and improve patient's tolerance to cervical movement. Ended session with cervical rotation AROM. Patient reported a decrease in pain to 5/10 at end of session.    Personal Factors and Comorbidities  Comorbidity 2    Comorbidities  Anxiety, A-Fib    Examination-Activity Limitations  Lift;Reach Overhead    Examination-Participation Restrictions  Community Activity;Yard Work;Meal Prep;Driving    Stability/Clinical Decision Making  Stable/Uncomplicated    Rehab Potential  Good    PT Frequency  2x / week    PT Duration  6 weeks    PT Treatment/Interventions  ADLs/Self Care Home Management;Cryotherapy;Electrical Stimulation;Moist Heat;Traction;Functional mobility training;Therapeutic activities;Therapeutic exercise;Neuromuscular re-education;Patient/family education;Manual techniques;Scar mobilization;Passive range of motion;Energy conservation;Taping    PT Next Visit Plan  Continue to F/U regarding swallowing issues. Assess anterior musculature for tightness/adhesions.  Manual including gentle thoracic joint mobilizations to decrease pain and soft tissue mobilization as needed to decrease restrictions/pain. Postural exercises.     PT Home Exercise Plan  03/22/19: Cervical Rotation, flexion/extensions x10 1x/day    Consulted and Agree with Plan of Care  Patient       Patient will benefit from skilled therapeutic intervention in order to improve the following deficits and impairments:  Increased fascial restricitons, Impaired sensation, Pain, Decreased mobility, Postural dysfunction, Decreased activity  tolerance, Decreased endurance, Decreased range of motion, Decreased strength, Hypomobility, Impaired UE functional use  Visit Diagnosis: Cervicalgia  Other symptoms and signs involving the musculoskeletal system     Problem List Patient Active Problem List   Diagnosis Date Noted  . Encounter for screening colonoscopy 07/02/2018  . OSA (obstructive sleep apnea) 05/31/2018  . Benign essential HTN 05/31/2018  . Obesity (BMI 30-39.9) 05/31/2018   Clarene Critchley PT, DPT 1:05 PM, 03/30/19 [place], [place], [address] Phone: [phone]   Fax:  [phone]  Name: William Kim MRN: [id number] Date of Birth: 1965-12-19

## 2019-04-05 ENCOUNTER — Ambulatory Visit (HOSPITAL_COMMUNITY): Payer: 59 | Attending: Neurological Surgery | Admitting: Physical Therapy

## 2019-04-05 ENCOUNTER — Encounter (HOSPITAL_COMMUNITY): Payer: Self-pay | Admitting: Physical Therapy

## 2019-04-05 ENCOUNTER — Other Ambulatory Visit: Payer: Self-pay

## 2019-04-05 DIAGNOSIS — R29898 Other symptoms and signs involving the musculoskeletal system: Secondary | ICD-10-CM | POA: Diagnosis present

## 2019-04-05 DIAGNOSIS — M542 Cervicalgia: Secondary | ICD-10-CM | POA: Diagnosis present

## 2019-04-05 NOTE — Therapy (Addendum)
[place], [place], [address] Phone: [phone]   Fax:  [phone]  Physical Therapy Treatment  Patient Details  Name: William Kim MRN: [id number] Date of Birth: 05/08/1966 Referring Provider (PT): Eustace Moore, MD    Encounter Date: 04/05/2019  PT End of Session - 04/05/19 1134    Visit Number  3    Number of Visits  12    Date for PT Re-Evaluation  05/03/19   Mini re-assess 03/13/19   Authorization Type  Generic [employer] 60 days 0 used All therapies    Authorization Time Period  03/22/19 - 05/03/19    Authorization - Visit Number  3    Authorization - Number of Visits  60    PT Start Time  [id number]    PT Stop Time  1216    PT Time Calculation (min)  41 min    Activity Tolerance  Patient tolerated treatment well;No increased pain    Behavior During Therapy  WFL for tasks assessed/performed       Past Medical History:  Diagnosis Date  . A-fib ([place])   . High cholesterol   . Hypertension   . OSA (obstructive sleep apnea) 05/31/2018   Severe OSA with AHI 79/h with significant oxygen desaturations as low as 58%.    Past Surgical History:  Procedure Laterality Date  . COLONOSCOPY WITH PROPOFOL N/A 08/02/2018   Procedure: COLONOSCOPY WITH PROPOFOL;  Surgeon: Daneil Dolin, MD;  [facility];  Service: Endoscopy;  Laterality: N/A;  12:15pm  . None      There were no vitals filed for this visit.  Subjective Assessment - 04/05/19 1300    Subjective  States he is starting to feel better. Reports he is still having numbness down his left UE and pain down his right UE, tightness in his throat but his MD reassured him it will just take time.    Pertinent History  Cervical Surgery 12/10/18    Limitations  Lifting;House hold activities    Diagnostic tests  Negative Chest X-ray    Patient Stated Goals  Decreased pain in shoulder and neck    Currently in Pain?  Yes    Pain Score  5     Pain Location   Shoulder    Pain Orientation  Right    Pain Descriptors / Indicators  Aching    Pain Onset  More than a month ago         [facility] PT Assessment - 04/05/19 0001      Assessment   Medical Diagnosis  Cervicalgia    Referring Provider (PT)  Eustace Moore, MD     Onset Date/Surgical Date  12/10/18    Hand Dominance  Left    Next MD Visit  04/28/19    Prior Therapy  None                   [facility] Adult PT Treatment/Exercise - 04/05/19 0001      Neck Exercises: Standing   Other Standing Exercises  Self STM to thoracic musculature at wall with LAX ball       Manual Therapy   Manual Therapy  Soft tissue mobilization;Joint mobilization    Manual therapy comments  All manual was completed separately from other skilled interventions    Joint Mobilization  UAP to ribs 2-5 (pt supine) grade II/III - no reproduction of symptoms. AP of R glenohumeral joint grade II/III -  tolerated well - less symptoms noted afterwards. UPA to ribs 3-6 (pt supine) R side only, grade II/III - tolerated well     Soft tissue mobilization  Supine -STM to R- UT/levator, cervical paraspinals (anterior and posterior attachments), anterior scalenes, anterior deltoid. B- SCM. - Patient tolerated very well, less pain noted and improved mobility noted.       Neck Exercises: Stretches   Other Neck Stretches  supine: PROM of cervical spine wtih overpressure with holds - in painfree ROM - 3x6, 5-10" holds Bilat             PT Education - 04/05/19 1250    Education Details  educated patient in deep breathing exercises and how to decrease SNS response. Practiced in clinic "improved relaxation" noted with breathing. Educated patient on surgery and to avoid excessive cervical extension right now secondary to stretch placed on anterior cervical musculature and reproduction of symptoms.    Person(s) Educated  Patient    Methods  Explanation    Comprehension  Verbalized understanding       PT Short Term Goals -  03/30/19 1124      PT SHORT TERM GOAL #1   Title  Patient will report understanding and regular compliance with HEP to improve cervical ROM, decrease pain, and improve overall functional mobility.    Time  3    Period  Weeks    Status  On-going    Target Date  04/12/19      PT SHORT TERM GOAL #2   Title  Patient will report a decrease in pain intensity to no more than a 4/10 over the course of a 1 week period for  improved tolerance to daily activities and to return to work.    Time  3    Period  Weeks    Status  On-going    Target Date  04/12/19        PT Long Term Goals - 03/30/19 1125      PT LONG TERM GOAL #1   Title  Patient will report a decrease in pain intensity to no more than a 2/10 over the course of a 1 week period for  improved tolerance to daily activities and to return to work.    Time  6    Period  Weeks    Status  On-going      PT LONG TERM GOAL #2   Title  Patient will demonstrate cervical AROM WFL and with minimal to no reports of pain in order to interact with environment more safely and with decreased irritibility of symptoms.    Time  6    Period  Weeks    Status  On-going      PT LONG TERM GOAL #3   Title  Patient will demonstrate ability to perform shoulder height lifting of box for 10 repetitions without pain to improve endurance to return to work and household chores.    Time  6    Period  Weeks    Status  On-going            Plan - 04/05/19 1134    Clinical Impression Statement  Patient tolerated session very well. No symptoms down left UE noted end of session, decreased tension and improved mobility and no symptoms down right UE at rest. Symptoms reproduced down right UE with cervical extension (patient still demonstrating forward head posture which is likely contributing to symptoms with cervical extension. Will follow up with symptoms/mobility next  session and continue to focus on soft tissue mobilization at this time. Patient would  continue to benefit from skilled physical therapy to improve post operative functional mobility.    Personal Factors and Comorbidities  Comorbidity 2    Comorbidities  Anxiety, A-Fib    Examination-Activity Limitations  Lift;Reach Overhead    Examination-Participation Restrictions  Community Activity;Yard Work;Meal Prep;Driving    Stability/Clinical Decision Making  Stable/Uncomplicated    Rehab Potential  Good    PT Frequency  2x / week    PT Duration  6 weeks    PT Treatment/Interventions  ADLs/Self Care Home Management;Cryotherapy;Electrical Stimulation;Moist Heat;Traction;Functional mobility training;Therapeutic activities;Therapeutic exercise;Neuromuscular re-education;Patient/family education;Manual techniques;Scar mobilization;Passive range of motion;Energy conservation;Taping    PT Next Visit Plan  STM to cervical paraspinals (anterior and posterior attachments), anterior scalenes, UT. painfree PROM. cervical retraction PROM/AAROM first then AROM. low level posture exercises/education    PT Home Exercise Plan  deep breathing, self mobilization to thoracic musculature with LAX ball.    Consulted and Agree with Plan of Care  Patient       Patient will benefit from skilled therapeutic intervention in order to improve the following deficits and impairments:  Increased fascial restricitons, Impaired sensation, Pain, Decreased mobility, Postural dysfunction, Decreased activity tolerance, Decreased endurance, Decreased range of motion, Decreased strength, Hypomobility, Impaired UE functional use  Visit Diagnosis: Cervicalgia  Other symptoms and signs involving the musculoskeletal system     Problem List Patient Active Problem List   Diagnosis Date Noted  . Encounter for screening colonoscopy 07/02/2018  . OSA (obstructive sleep apnea) 05/31/2018  . Benign essential HTN 05/31/2018  . Obesity (BMI 30-39.9) 05/31/2018   1:01 PM, 04/05/19 Jerene Pitch, DPT Physical Therapy with  [facility]  [phone] office  [place] [address] [place], [place], [address] Phone: [phone]   Fax:  [phone]  Name: William Kim MRN: [id number] Date of Birth: Sep 21, 1965

## 2019-04-07 ENCOUNTER — Encounter (HOSPITAL_COMMUNITY): Payer: Self-pay

## 2019-04-07 ENCOUNTER — Ambulatory Visit (HOSPITAL_COMMUNITY)
Admission: RE | Admit: 2019-04-07 | Discharge: 2019-04-07 | Disposition: A | Discharge status: Home / Self Care | Payer: 59 | Source: Ambulatory Visit | Attending: Otolaryngology | Admitting: Otolaryngology

## 2019-04-07 ENCOUNTER — Other Ambulatory Visit: Payer: Self-pay

## 2019-04-07 ENCOUNTER — Emergency Department (HOSPITAL_COMMUNITY)
Admission: EM | Admit: 2019-04-07 | Discharge: 2019-04-07 | Disposition: A | Discharge status: Home / Self Care | Payer: 59 | Attending: Emergency Medicine | Admitting: Emergency Medicine

## 2019-04-07 ENCOUNTER — Telehealth (HOSPITAL_COMMUNITY): Payer: Self-pay | Admitting: Physical Therapy

## 2019-04-07 ENCOUNTER — Ambulatory Visit (HOSPITAL_COMMUNITY): Payer: 59 | Admitting: Physical Therapy

## 2019-04-07 DIAGNOSIS — Z5321 Procedure and treatment not carried out due to patient leaving prior to being seen by health care provider: Secondary | ICD-10-CM | POA: Diagnosis not present

## 2019-04-07 DIAGNOSIS — R0602 Shortness of breath: Secondary | ICD-10-CM | POA: Diagnosis not present

## 2019-04-07 DIAGNOSIS — K118 Other diseases of salivary glands: Secondary | ICD-10-CM | POA: Insufficient documentation

## 2019-04-07 DIAGNOSIS — M542 Cervicalgia: Secondary | ICD-10-CM

## 2019-04-07 DIAGNOSIS — I10 Essential (primary) hypertension: Secondary | ICD-10-CM | POA: Diagnosis present

## 2019-04-07 DIAGNOSIS — R29898 Other symptoms and signs involving the musculoskeletal system: Secondary | ICD-10-CM

## 2019-04-07 MED ORDER — IOHEXOL 300 MG/ML  SOLN
75.0000 mL | Freq: Once | INTRAMUSCULAR | Status: AC | PRN
  Administered 2019-04-07: 75 mL via INTRAVENOUS

## 2019-04-07 NOTE — ED Notes (Signed)
Called no answer

## 2019-04-07 NOTE — ED Triage Notes (Signed)
Pt reports that his BP has been running high for a while.. Pt says that he went to the dentist last week and it was high. Reports some SOB. Taking his cardizem as ordered and hydroxyzine.

## 2019-04-07 NOTE — Therapy (Signed)
[place], [place], [address] Phone: [phone]   Fax:  [phone]  Physical Therapy Treatment  Patient Details  Name: William Kim MRN: [id number] Date of Birth: 17-Sep-1965 Referring Provider (PT): Eustace Moore, MD    Encounter Date: 04/07/2019  PT End of Session - 04/07/19 1227    Visit Number  4    Number of Visits  12    Date for PT Re-Evaluation  05/03/19   Mini re-assess 03/13/19   Authorization Type  Generic [employer] 60 days 0 used All therapies    Authorization Time Period  03/22/19 - 05/03/19    Authorization - Visit Number  4    Authorization - Number of Visits  60    PT Start Time  [id number]    PT Stop Time  1133    PT Time Calculation (min)  48 min    Activity Tolerance  Patient tolerated treatment well;No increased pain    Behavior During Therapy  WFL for tasks assessed/performed       Past Medical History:  Diagnosis Date  . A-fib ([place])   . High cholesterol   . Hypertension   . OSA (obstructive sleep apnea) 05/31/2018   Severe OSA with AHI 79/h with significant oxygen desaturations as low as 58%.    Past Surgical History:  Procedure Laterality Date  . COLONOSCOPY WITH PROPOFOL N/A 08/02/2018   Procedure: COLONOSCOPY WITH PROPOFOL;  Surgeon: Daneil Dolin, MD;  [facility];  Service: Endoscopy;  Laterality: N/A;  12:15pm  . None      There were no vitals filed for this visit.  Subjective Assessment - 04/07/19 1048    Subjective  States he felt pretty good yesterday. States he woke up this morning with intense symptoms down his right arm. Reports it is shooting and burnning down his arm right now.    Pertinent History  Cervical Surgery 12/10/18    Limitations  Lifting;House hold activities    Diagnostic tests  Negative Chest X-ray    Patient Stated Goals  Decreased pain in shoulder and neck    Currently in Pain?  Yes    Pain Score  7     Pain Location  Neck    Pain  Orientation  Right    Pain Descriptors / Indicators  Aching;Burning;Other (Comment)   stinging   Pain Radiating Towards  down to the right shoulder    Pain Onset  More than a month ago                       [facility] Adult PT Treatment/Exercise - 04/07/19 0001      Neck Exercises: Supine   Other Supine Exercise  cervical bobble heads - pain free ROM - prior demo and tactile cues. 2x30      Manual Therapy   Manual Therapy  Soft tissue mobilization;Joint mobilization;Manual Traction    Manual therapy comments  All manual was completed separately from other skilled interventions    Joint Mobilization  supine- AP of R glenohumeral joint grade II/III - tolerated well - less symptoms noted afterwards. Medial cervical glides grade II C2-4  Right. UPA to C1 grade II bilaterally performed.     Soft tissue mobilization  Supine -STM to R- UT/levator, cervical paraspinals (anterior and posterior attachments), anterior scalenes, SCM. - Patient tolerated very well, less pain noted and improved mobility noted. S    Manual Traction  with suboccipital overpressure. tolerated well - 5 minutes      Neck Exercises: Stretches   Other Neck Stretches  supine: PROM of cervical spine wtih overpressure with holds - in painfree ROM - x12, 5-10" holds Bilat               PT Short Term Goals - 03/30/19 1124      PT SHORT TERM GOAL #1   Title  Patient will report understanding and regular compliance with HEP to improve cervical ROM, decrease pain, and improve overall functional mobility.    Time  3    Period  Weeks    Status  On-going    Target Date  04/12/19      PT SHORT TERM GOAL #2   Title  Patient will report a decrease in pain intensity to no more than a 4/10 over the course of a 1 week period for  improved tolerance to daily activities and to return to work.    Time  3    Period  Weeks    Status  On-going    Target Date  04/12/19        PT Long Term Goals - 03/30/19 1125       PT LONG TERM GOAL #1   Title  Patient will report a decrease in pain intensity to no more than a 2/10 over the course of a 1 week period for  improved tolerance to daily activities and to return to work.    Time  6    Period  Weeks    Status  On-going      PT LONG TERM GOAL #2   Title  Patient will demonstrate cervical AROM WFL and with minimal to no reports of pain in order to interact with environment more safely and with decreased irritibility of symptoms.    Time  6    Period  Weeks    Status  On-going      PT LONG TERM GOAL #3   Title  Patient will demonstrate ability to perform shoulder height lifting of box for 10 repetitions without pain to improve endurance to return to work and household chores.    Time  6    Period  Weeks    Status  On-going            Plan - 04/07/19 1228    Clinical Impression Statement  Patient reported less pain, 5/10 end of session. Continued burning in right UE noted, symptoms in R UE present with palpation of right cervical paraspinals and UT and with cervical retraction and right side bending. Educated patient in small pain free movements and to continue deep breathing exercises at home with heat. Educated patient on dry needling and how it would potentially benefit patient. Will continue to work on soft tissue and joints as tolerated to promote cervical ROM and decrease pain. Patient would continue to benefit form skilled physical therapy.    Personal Factors and Comorbidities  Comorbidity 2    Comorbidities  Anxiety, A-Fib    Examination-Activity Limitations  Lift;Reach Overhead    Examination-Participation Restrictions  Community Activity;Yard Work;Meal Prep;Driving    Stability/Clinical Decision Making  Stable/Uncomplicated    Rehab Potential  Good    PT Frequency  2x / week    PT Duration  6 weeks    PT Treatment/Interventions  ADLs/Self Care Home Management;Cryotherapy;Electrical Stimulation;Moist Heat;Traction;Functional mobility  training;Therapeutic activities;Therapeutic exercise;Neuromuscular re-education;Patient/family education;Manual techniques;Scar mobilization;Passive range of motion;Energy conservation;Taping  PT Next Visit Plan  STM to cervical paraspinals (anterior and posterior attachments), anterior scalenes, UT. painfree PROM. cervical retraction PROM/AAROM first then AROM. low level posture exercises/education    PT Home Exercise Plan  deep breathing, self mobilization to thoracic musculature with LAX ball. tiny bobbleheads    Consulted and Agree with Plan of Care  Patient       Patient will benefit from skilled therapeutic intervention in order to improve the following deficits and impairments:  Increased fascial restricitons, Impaired sensation, Pain, Decreased mobility, Postural dysfunction, Decreased activity tolerance, Decreased endurance, Decreased range of motion, Decreased strength, Hypomobility, Impaired UE functional use  Visit Diagnosis: Cervicalgia  Other symptoms and signs involving the musculoskeletal system     Problem List Patient Active Problem List   Diagnosis Date Noted  . Encounter for screening colonoscopy 07/02/2018  . OSA (obstructive sleep apnea) 05/31/2018  . Benign essential HTN 05/31/2018  . Obesity (BMI 30-39.9) 05/31/2018    12:36 PM, 04/07/19 Jerene Pitch, DPT Physical Therapy with [facility]  [phone] office  [place] [address] [place], [place], [address] Phone: [phone]   Fax:  [phone]  Name: William Kim MRN: [id number] Date of Birth: 1965/10/01

## 2019-04-07 NOTE — Telephone Encounter (Signed)
Called pt and  he understands the apptment change from Monday to Tues on 10/13. Keep on Michele's schedule  may decide to do Dry Needling today.

## 2019-04-08 ENCOUNTER — Emergency Department (HOSPITAL_COMMUNITY)
Admission: EM | Admit: 2019-04-08 | Discharge: 2019-04-08 | Disposition: A | Discharge status: Home / Self Care | Payer: 59 | Attending: Emergency Medicine | Admitting: Emergency Medicine

## 2019-04-08 ENCOUNTER — Other Ambulatory Visit: Payer: Self-pay

## 2019-04-08 ENCOUNTER — Emergency Department (HOSPITAL_COMMUNITY): Payer: 59

## 2019-04-08 ENCOUNTER — Encounter (HOSPITAL_COMMUNITY): Payer: Self-pay | Admitting: Emergency Medicine

## 2019-04-08 DIAGNOSIS — Z7982 Long term (current) use of aspirin: Secondary | ICD-10-CM | POA: Insufficient documentation

## 2019-04-08 DIAGNOSIS — R0602 Shortness of breath: Secondary | ICD-10-CM | POA: Insufficient documentation

## 2019-04-08 DIAGNOSIS — R519 Headache, unspecified: Secondary | ICD-10-CM | POA: Insufficient documentation

## 2019-04-08 DIAGNOSIS — I4891 Unspecified atrial fibrillation: Secondary | ICD-10-CM | POA: Diagnosis not present

## 2019-04-08 DIAGNOSIS — Z87891 Personal history of nicotine dependence: Secondary | ICD-10-CM | POA: Diagnosis not present

## 2019-04-08 DIAGNOSIS — Z79899 Other long term (current) drug therapy: Secondary | ICD-10-CM | POA: Diagnosis not present

## 2019-04-08 DIAGNOSIS — F419 Anxiety disorder, unspecified: Secondary | ICD-10-CM

## 2019-04-08 DIAGNOSIS — I10 Essential (primary) hypertension: Secondary | ICD-10-CM | POA: Insufficient documentation

## 2019-04-08 HISTORY — DX: Cervicalgia: M54.2

## 2019-04-08 HISTORY — DX: Anxiety disorder, unspecified: F41.9

## 2019-04-08 LAB — CBC WITH DIFFERENTIAL/PLATELET
Abs Immature Granulocytes: 0.01 10*3/uL (ref 0.00–0.07)
Basophils Absolute: 0 10*3/uL (ref 0.0–0.1)
Basophils Relative: 0 %
Eosinophils Absolute: 0 10*3/uL (ref 0.0–0.5)
Eosinophils Relative: 0 %
HCT: 41.1 % (ref 39.0–52.0)
Hemoglobin: 13.9 g/dL (ref 13.0–17.0)
Immature Granulocytes: 0 %
Lymphocytes Relative: 20 %
Lymphs Abs: 1.2 10*3/uL (ref 0.7–4.0)
MCH: 30.2 pg (ref 26.0–34.0)
MCHC: 33.8 g/dL (ref 30.0–36.0)
MCV: 89.2 fL (ref 80.0–100.0)
Monocytes Absolute: 0.5 10*3/uL (ref 0.1–1.0)
Monocytes Relative: 9 %
Neutro Abs: 4.1 10*3/uL (ref 1.7–7.7)
Neutrophils Relative %: 71 %
Platelets: 258 10*3/uL (ref 150–400)
RBC: 4.61 MIL/uL (ref 4.22–5.81)
RDW: 13.8 % (ref 11.5–15.5)
WBC: 5.8 10*3/uL (ref 4.0–10.5)
nRBC: 0 % (ref 0.0–0.2)

## 2019-04-08 LAB — RAPID URINE DRUG SCREEN, HOSP PERFORMED
Amphetamines: NOT DETECTED
Barbiturates: NOT DETECTED
Benzodiazepines: NOT DETECTED
Cocaine: NOT DETECTED
Opiates: NOT DETECTED
Tetrahydrocannabinol: NOT DETECTED

## 2019-04-08 LAB — BASIC METABOLIC PANEL
Anion gap: 11 (ref 5–15)
BUN: 8 mg/dL (ref 6–20)
CO2: 23 mmol/L (ref 22–32)
Calcium: 9.8 mg/dL (ref 8.9–10.3)
Chloride: 109 mmol/L (ref 98–111)
Creatinine, Ser: 0.82 mg/dL (ref 0.61–1.24)
GFR calc Af Amer: >60 mL/min (ref >60)
GFR calc non Af Amer: >60 mL/min (ref >60)
Glucose, Bld: 102 mg/dL — ABNORMAL HIGH (ref 70–99)
Potassium: 3.2 mmol/L — ABNORMAL LOW (ref 3.5–5.1)
Sodium: 143 mmol/L (ref 135–145)

## 2019-04-08 LAB — URINALYSIS, ROUTINE W REFLEX MICROSCOPIC
Bilirubin Urine: NEGATIVE
Glucose, UA: NEGATIVE mg/dL
Hgb urine dipstick: NEGATIVE
Ketones, ur: 5 mg/dL — AB
Leukocytes,Ua: NEGATIVE
Nitrite: NEGATIVE
Protein, ur: NEGATIVE mg/dL
Specific Gravity, Urine: 1.001 — ABNORMAL LOW (ref 1.005–1.030)
pH: 8 (ref 5.0–8.0)

## 2019-04-08 LAB — TROPONIN I (HIGH SENSITIVITY)
Troponin I (High Sensitivity): 8 ng/L (ref <18)
Troponin I (High Sensitivity): 10 ng/L (ref <18)

## 2019-04-08 MED ORDER — POTASSIUM CHLORIDE CRYS ER 20 MEQ PO TBCR
40.0000 meq | EXTENDED_RELEASE_TABLET | Freq: Once | ORAL | Status: AC
  Administered 2019-04-08: 40 meq via ORAL
  Filled 2019-04-08: qty 2

## 2019-04-08 MED ORDER — HYDROXYZINE HCL 25 MG PO TABS
25.0000 mg | ORAL_TABLET | Freq: Once | ORAL | Status: AC
  Administered 2019-04-08: 09:00:00 25 mg via ORAL
  Filled 2019-04-08: qty 1

## 2019-04-08 NOTE — ED Triage Notes (Signed)
Pt states that he is having sob headache and his blood pressure is elevated this has been going on for about 6 weeks

## 2019-04-08 NOTE — ED Provider Notes (Signed)
[name] Provider Note   CSN: [id number] Arrival date & time: 04/08/19  [id number]     History   Chief Complaint Chief Complaint  Patient presents with  . Hypertension  . Shortness of Breath  . Headache    HPI William Kim is a 53 y.o. male.     HPI  Pt was seen at 0810. Per pt, c/o gradual onset and persistence of constant "high blood pressure" for the past 2 weeks. Pt states he went to the dentist and was told his "BP was high." Pt states since finding out this information he has felt "like I'm going to die." Pt states he feels SOB and his sinuses and nose "is stuffy." Pt states he has been taking OTC decongestant for his congestion symptoms with some improvement. Pt endorses compliance with his morning and evening doses of long acting cardizem. States he also has cardizem 30mg  tabs to take as needed but he has not taken them. Pt wonders if he is having anxiety/anxiety attack. Denies CP/palpitations, no SOB/cough, no abd pain, no N/V/D, no back pain, no headache, no visual changes, no focal motor weakness, no tingling/numbness in extremities, no ataxia, no slurred speech, no facial droop.     Past Medical History:  Diagnosis Date  . A-fib ([place])   . Anxiety   . Cervicalgia   . High cholesterol   . Hypertension   . OSA (obstructive sleep apnea) 05/31/2018   Severe OSA with AHI 79/h with significant oxygen desaturations as low as 58%.    Patient Active Problem List   Diagnosis Date Noted  . Encounter for screening colonoscopy 07/02/2018  . OSA (obstructive sleep apnea) 05/31/2018  . Benign essential HTN 05/31/2018  . Obesity (BMI 30-39.9) 05/31/2018    Past Surgical History:  Procedure Laterality Date  . COLONOSCOPY WITH PROPOFOL N/A 08/02/2018   Procedure: COLONOSCOPY WITH PROPOFOL;  Surgeon: Daneil Dolin, MD;  [facility];  Service: Endoscopy;  Laterality: N/A;  12:15pm  . None          Home Medications    Prior to Admission  medications   Medication Sig Start Date End Date Taking? Authorizing Provider  Ascorbic Acid (VITAMIN C PO) Take 1 tablet by mouth daily.    [provider]  aspirin EC 81 MG tablet Take 81 mg by mouth daily.    [provider]  CARTIA XT 180 MG 24 hr capsule TAKE 1 CAPSULE EVERY MORNING 08/27/18   Sherran Needs, NP  diltiazem (CARDIZEM CD) 120 MG 24 hr capsule Take 1 capsule (120 mg total) by mouth at bedtime. 10/27/17   Sherran Needs, NP  diltiazem (CARDIZEM) 30 MG tablet Take 1 tablet every 4 hours AS NEEDED for AFIB heart rate >100 02/23/19   Sherran Needs, NP  hydrOXYzine (ATARAX/VISTARIL) 25 MG tablet Take 1 tablet (25 mg total) by mouth every 6 (six) hours as needed for anxiety. 02/24/19   Long, Wonda Olds, MD  Misc Natural Products (HEALTHY LIVER PO) Take 1 capsule by mouth at bedtime.    [provider]  Omega-3 Fatty Acids (FISH OIL PO) Take 1 capsule by mouth daily.     [provider]  simvastatin (ZOCOR) 20 MG tablet Take 20 mg by mouth daily.    [provider]  valACYclovir (VALTREX) 500 MG tablet Take 1 tablet by mouth daily. 03/30/16   [provider]    Family History Family History  Problem Relation  Age of Onset  . Colon cancer Neg Hx   . Colon polyps Neg Hx     Social History Social History   Tobacco Use  . Smoking status: Former Research scientist (life sciences)  . Smokeless tobacco: Never Used  . Tobacco comment: quit smoking in 2012/2013, quit smoking August 16th  Substance Use Topics  . Alcohol use: Yes    Comment: "1-2 beers and maybe a mixed drink a day"  . Drug use: No     Allergies   Patient has no known allergies.   Review of Systems Review of Systems ROS: Statement: All systems negative except as marked or noted in the HPI; Constitutional: Negative for fever and chills. ; ; Eyes: Negative for eye pain, redness and discharge. ; ; ENMT: Negative for ear pain, hoarseness, sore throat. +nasal congestion. ; ;  Cardiovascular: Negative for chest pain, palpitations, diaphoresis, dyspnea and peripheral edema. ; ; Respiratory: Negative for cough, wheezing and stridor. ; ; Gastrointestinal: Negative for nausea, vomiting, diarrhea, abdominal pain, blood in stool, hematemesis, jaundice and rectal bleeding. . ; ; Genitourinary: Negative for dysuria, flank pain and hematuria. ; ; Musculoskeletal: Negative for back pain and neck pain. Negative for swelling and trauma.; ; Skin: Negative for pruritus, rash, abrasions, blisters, bruising and skin lesion.; ; Neuro: Negative for headache, lightheadedness and neck stiffness. Negative for weakness, altered level of consciousness, altered mental status, extremity weakness, paresthesias, involuntary movement, seizure and syncope.       Physical Exam Updated Vital Signs BP (!) 146/103 (BP Location: Left Arm)   Pulse 89   Temp 98.3 F (36.8 C) (Oral)   Resp 14   Ht 6' (1.829 m)   Wt 93 kg   SpO2 100%   BMI 27.81 kg/m    Patient Vitals for the past 24 hrs:  BP Temp Temp src Pulse Resp SpO2 Height Weight  04/08/19 1200 (!) 137/92 - - 77 17 100 % - -  04/08/19 0930 (!) 140/94 - - 74 14 98 % - -  04/08/19 0900 (!) 138/96 - - 72 13 98 % - -  04/08/19 0830 (!) 153/109 - - 78 14 100 % - -  04/08/19 0802 (!) 146/103 98.3 F (36.8 C) Oral 89 14 100 % - -  04/08/19 0801 - - - - - - 6' (1.829 m) 93 kg    Physical Exam 0815: Physical examination:  Nursing notes reviewed; Vital signs and O2 SAT reviewed;  Constitutional: Well developed, Well nourished, Well hydrated, In no acute distress; Head:  Normocephalic, atraumatic; Eyes: EOMI, PERRL, No scleral icterus; ENMT: TM's clear bilat. +edemetous nasal turbinates bilat with clear rhinorrhea. Mouth and pharynx normal, Mucous membranes moist; Neck: Supple, Full range of motion, No lymphadenopathy; Cardiovascular: Regular rate and rhythm, No gallop; Respiratory: Breath sounds clear & equal bilaterally, No wheezes.  Speaking full  sentences with ease, Normal respiratory effort/excursion; Chest: Nontender, Movement normal; Abdomen: Soft, Nontender, Nondistended, Normal bowel sounds; Genitourinary: No CVA tenderness; Extremities: Peripheral pulses normal, No tenderness, No edema, No calf edema or asymmetry.; Neuro: AA&Ox3, Major CN grossly intact. No facial droop. Speech clear. No gross focal motor or sensory deficits in extremities.; Skin: Color normal, Warm, Dry.; Psych:  Anxious, rapid/pressured speech.     ED Treatments / Results  Labs (all labs ordered are listed, but only abnormal results are displayed)   EKG EKG Interpretation  Date/Time:  Friday April 08 2019 08:01:27 EDT Ventricular Rate:  86 PR Interval:    QRS Duration: 84 QT  Interval:  357 QTC Calculation: 427 R Axis:   62 Text Interpretation:  Sinus rhythm Atrial premature complexes Baseline wander When compared with ECG of 04/07/2019, 02/24/2019 No significant change was found Confirmed by Francine Graven [phone]) on 04/08/2019 8:31:33 AM   Radiology   Procedures Procedures (including critical care time)  Medications Ordered in ED Medications  hydrOXYzine (ATARAX/VISTARIL) tablet 25 mg (has no administration in time range)     Initial Impression / Assessment and Plan / ED Course  I have reviewed the triage vital signs and the nursing notes.  Pertinent labs & imaging results that were available during my care of the patient were reviewed by me and considered in my medical decision making (see chart for details).       MDM Reviewed: previous chart, nursing note and vitals Reviewed previous: labs and ECG Interpretation: labs, ECG, x-ray and CT scan   Results for orders placed or performed during the hospital encounter of [id number]  Basic metabolic panel  Result Value Ref Range   Sodium 143 135 - 145 mmol/L   Potassium 3.2 (L) 3.5 - 5.1 mmol/L   Chloride 109 98 - 111 mmol/L   CO2 23 22 - 32 mmol/L   Glucose, Bld 102 (H) 70 - 99  mg/dL   BUN 8 6 - 20 mg/dL   Creatinine, Ser 0.82 0.61 - 1.24 mg/dL   Calcium 9.8 8.9 - 10.3 mg/dL   GFR calc non Af Amer >60 >60 mL/min   GFR calc Af Amer >60 >60 mL/min   Anion gap 11 5 - 15  CBC with Differential  Result Value Ref Range   WBC 5.8 4.0 - 10.5 K/uL   RBC 4.61 4.22 - 5.81 MIL/uL   Hemoglobin 13.9 13.0 - 17.0 g/dL   HCT 41.1 39.0 - 52.0 %   MCV 89.2 80.0 - 100.0 fL   MCH 30.2 26.0 - 34.0 pg   MCHC 33.8 30.0 - 36.0 g/dL   RDW 13.8 11.5 - 15.5 %   Platelets 258 150 - 400 K/uL   nRBC 0.0 0.0 - 0.2 %   Neutrophils Relative % 71 %   Neutro Abs 4.1 1.7 - 7.7 K/uL   Lymphocytes Relative 20 %   Lymphs Abs 1.2 0.7 - 4.0 K/uL   Monocytes Relative 9 %   Monocytes Absolute 0.5 0.1 - 1.0 K/uL   Eosinophils Relative 0 %   Eosinophils Absolute 0.0 0.0 - 0.5 K/uL   Basophils Relative 0 %   Basophils Absolute 0.0 0.0 - 0.1 K/uL   Immature Granulocytes 0 %   Abs Immature Granulocytes 0.01 0.00 - 0.07 K/uL  Urinalysis, Routine w reflex microscopic  Result Value Ref Range   Color, Urine COLORLESS (A) YELLOW   APPearance CLEAR CLEAR   Specific Gravity, Urine 1.001 (L) 1.005 - 1.030   pH 8.0 5.0 - 8.0   Glucose, UA NEGATIVE NEGATIVE mg/dL   Hgb urine dipstick NEGATIVE NEGATIVE   Bilirubin Urine NEGATIVE NEGATIVE   Ketones, ur 5 (A) NEGATIVE mg/dL   Protein, ur NEGATIVE NEGATIVE mg/dL   Nitrite NEGATIVE NEGATIVE   Leukocytes,Ua NEGATIVE NEGATIVE  Urine rapid drug screen (hosp performed)  Result Value Ref Range   Opiates NONE DETECTED NONE DETECTED   Cocaine NONE DETECTED NONE DETECTED   Benzodiazepines NONE DETECTED NONE DETECTED   Amphetamines NONE DETECTED NONE DETECTED   Tetrahydrocannabinol NONE DETECTED NONE DETECTED   Barbiturates NONE DETECTED NONE DETECTED  Troponin I (High Sensitivity)  Result Value Ref  Range   Troponin I (High Sensitivity) 8 <18 ng/L  Troponin I (High Sensitivity)  Result Value Ref Range   Troponin I (High Sensitivity) 10 <18 ng/L   Dg  Chest 2 View Result Date: 04/08/2019 CLINICAL DATA:  Shortness of breath and headaches for 6 weeks EXAM: CHEST - 2 VIEW COMPARISON:  February 24, 2019 FINDINGS: The heart size and mediastinal contours are within normal limits. Both lungs are clear. The visualized skeletal structures are stable. IMPRESSION: No active cardiopulmonary disease. Electronically Signed   By: Abelardo Diesel M.D.   On: 04/08/2019 08:57   Ct Head Wo Contrast Result Date: 04/08/2019 CLINICAL DATA:  Hypertension and headaches EXAM: CT HEAD WITHOUT CONTRAST TECHNIQUE: Contiguous axial images were obtained from the base of the skull through the vertex without intravenous contrast. COMPARISON:  None. FINDINGS: Brain: No evidence of acute infarction, hemorrhage, hydrocephalus, extra-axial collection or mass lesion/mass effect. Vascular: No hyperdense vessel. Skull: Normal. Negative for fracture or focal lesion. Sinuses/Orbits: No acute finding. Other: None IMPRESSION: No focal acute intracranial abnormality identified. Electronically Signed   By: Abelardo Diesel M.D.   On: 04/08/2019 09:09    William Kim was evaluated in Emergency Department on 04/08/2019 for the symptoms described in the history of present illness. He was evaluated in the context of the global COVID-19 pandemic, which necessitated consideration that the patient might be at risk for infection with the SARS-CoV-2 virus that causes COVID-19. Institutional protocols and algorithms that pertain to the evaluation of patients at risk for COVID-19 are in a state of rapid change based on information released by regulatory bodies including the CDC and federal and state organizations. These policies and algorithms were followed during the patient's care in the ED.   1245:  Potassium repleted PO. BP improved after pt given meds for anxiety. Pt states he feels better now and wants to go home.  Dx and testing, d/w pt.  Questions answered.  Verb understanding, agreeable to d/c home  with outpt f/u.    Final Clinical Impressions(s) / ED Diagnoses   Final diagnoses:  None    ED Discharge Orders    None       Francine Graven, DO 04/13/19 2311

## 2019-04-08 NOTE — Discharge Instructions (Signed)
Take your usual prescriptions as previously directed.  Call your regular medical doctor today to schedule a follow up appointment within the next 3 days.  Return to the Emergency Department immediately sooner if worsening.  ° °

## 2019-04-11 ENCOUNTER — Ambulatory Visit (HOSPITAL_COMMUNITY): Payer: 59

## 2019-04-12 ENCOUNTER — Other Ambulatory Visit: Payer: Self-pay

## 2019-04-12 ENCOUNTER — Ambulatory Visit (HOSPITAL_COMMUNITY): Payer: 59 | Admitting: Physical Therapy

## 2019-04-12 ENCOUNTER — Telehealth: Payer: Self-pay | Admitting: Cardiology

## 2019-04-12 ENCOUNTER — Encounter (HOSPITAL_COMMUNITY): Payer: Self-pay | Admitting: Physical Therapy

## 2019-04-12 DIAGNOSIS — R29898 Other symptoms and signs involving the musculoskeletal system: Secondary | ICD-10-CM

## 2019-04-12 DIAGNOSIS — M542 Cervicalgia: Secondary | ICD-10-CM | POA: Diagnosis not present

## 2019-04-12 NOTE — Telephone Encounter (Signed)
I spoke to the patient and mailed him his most recent OV indicating his physical well-being.  He verbalized understanding and was thankful for the call.

## 2019-04-12 NOTE — Telephone Encounter (Signed)
New message   Patient states that he needs a letter written in reference to his afib, anxiety and chronic hypertension. Please call to discuss.

## 2019-04-12 NOTE — Therapy (Signed)
[place], [place], [address] Phone: [phone]   Fax:  [phone]  Physical Therapy Treatment  Patient Details  Name: William Kim MRN: [id number] Date of Birth: [birth year] Referring Provider (PT): Eustace Moore, MD    Encounter Date: 04/12/2019  PT End of Session - 04/12/19 1315    Visit Number  5    Number of Visits  12    Date for PT Re-Evaluation  05/03/19   Mini re-assess 03/13/19   Authorization Type  Generic [employer] 60 days 0 used All therapies    Authorization Time Period  03/22/19 - 05/03/19    Authorization - Visit Number  5    Authorization - Number of Visits  60    PT Start Time  [id number]    PT Stop Time  1400    PT Time Calculation (min)  44 min    Activity Tolerance  Patient tolerated treatment well;No increased pain    Behavior During Therapy  WFL for tasks assessed/performed       Past Medical History:  Diagnosis Date  . A-fib ([place])   . Anxiety   . Cervicalgia   . High cholesterol   . Hypertension   . OSA (obstructive sleep apnea) 05/31/2018   Severe OSA with AHI 79/h with significant oxygen desaturations as low as 58%.    Past Surgical History:  Procedure Laterality Date  . COLONOSCOPY WITH PROPOFOL N/A 08/02/2018   Procedure: COLONOSCOPY WITH PROPOFOL;  Surgeon: Daneil Dolin, MD;  [facility];  Service: Endoscopy;  Laterality: N/A;  12:15pm  . None      There were no vitals filed for this visit.  Subjective Assessment - 04/12/19 1315    Subjective  States he feels a lot better. Reports he was really sore after the last session for about 2 days and he put heat on his muscles which really seemed to help. Reports once the soreness went away, his overall symptoms noted in his neck and arms were significantly less. States he feels pretty good today with minimal symptoms in his arms and neck.    Pertinent History  Cervical Surgery 12/10/18    Limitations  Lifting;House hold  activities    Diagnostic tests  Negative Chest X-ray    Patient Stated Goals  Decreased pain in shoulder and neck    Currently in Pain?  Yes    Pain Score  2     Pain Location  Neck    Pain Onset  More than a month ago         [facility] PT Assessment - 04/12/19 0001      Assessment   Medical Diagnosis  Cervicalgia    Referring Provider (PT)  Eustace Moore, MD     Onset Date/Surgical Date  12/10/18    Hand Dominance  Left    Next MD Visit  04/28/19    Prior Therapy  None                   [facility] Adult PT Treatment/Exercise - 04/12/19 0001      Neck Exercises: Supine   Other Supine Exercise  scapular retraction - 2x12, 5" holds     Other Supine Exercise  belly breathing with focus on nostral breathing, - 6 minutes      Manual Therapy   Manual Therapy  Soft tissue mobilization;Joint mobilization;Manual Traction    Manual therapy comments  All manual was  completed separately from other skilled interventions    Joint Mobilization  supine - medial glides to bilat ribs 6-10 grade III - timed with breathing. medial  glides to C7-t2 - tolerated well grade II with cervical ROM, UPA to ribs 2-4 grade III    Soft tissue mobilization  supine- R: cervical paraspinals, SCM, UT, anterior scalenes              PT Education - 04/12/19 1525    Education Details  in typical soreness after manual work or exercise    Person(s) Educated  Patient    Methods  Explanation    Comprehension  Verbalized understanding       PT Short Term Goals - 03/30/19 1124      PT SHORT TERM GOAL #1   Title  Patient will report understanding and regular compliance with HEP to improve cervical ROM, decrease pain, and improve overall functional mobility.    Time  3    Period  Weeks    Status  On-going    Target Date  04/12/19      PT SHORT TERM GOAL #2   Title  Patient will report a decrease in pain intensity to no more than a 4/10 over the course of a 1 week period for  improved tolerance to  daily activities and to return to work.    Time  3    Period  Weeks    Status  On-going    Target Date  04/12/19        PT Long Term Goals - 03/30/19 1125      PT LONG TERM GOAL #1   Title  Patient will report a decrease in pain intensity to no more than a 2/10 over the course of a 1 week period for  improved tolerance to daily activities and to return to work.    Time  6    Period  Weeks    Status  On-going      PT LONG TERM GOAL #2   Title  Patient will demonstrate cervical AROM WFL and with minimal to no reports of pain in order to interact with environment more safely and with decreased irritibility of symptoms.    Time  6    Period  Weeks    Status  On-going      PT LONG TERM GOAL #3   Title  Patient will demonstrate ability to perform shoulder height lifting of box for 10 repetitions without pain to improve endurance to return to work and household chores.    Time  6    Period  Weeks    Status  On-going            Plan - 04/12/19 1356    Clinical Impression Statement  Patient tolerated manual work very well today compared to previous session. Improvements noted painfree ROM, radicular symptoms and resting muscle tone during today's session. Continued "sting" in the same spot on the right side with end range cervical rotation, but "sting" not as bad as previous pain. Patient reported improvements in breathing with diaphragm release and rib mobility with a decrease in feeling that he has a "frog in his throat." Will continue to assess thoracic spine and ribs as tolerated and work on pain free mobility.    Personal Factors and Comorbidities  Comorbidity 2    Comorbidities  Anxiety, A-Fib    Examination-Activity Limitations  Lift;Reach Overhead    Examination-Participation Restrictions  Community Activity;Yard Work;Meal Prep;Driving  Stability/Clinical Decision Making  Stable/Uncomplicated    Rehab Potential  Good    PT Frequency  2x / week    PT Duration  6 weeks     PT Treatment/Interventions  ADLs/Self Care Home Management;Cryotherapy;Electrical Stimulation;Moist Heat;Traction;Functional mobility training;Therapeutic activities;Therapeutic exercise;Neuromuscular re-education;Patient/family education;Manual techniques;Scar mobilization;Passive range of motion;Energy conservation;Taping    PT Next Visit Plan  thoracic mobilization, diaphragm release,, STM to cervical paraspinals (anterior and posterior attachments), anterior scalenes, UT. painfree PROM. cervical retraction PROM/AAROM first then AROM. low level posture exercises/education    PT Home Exercise Plan  deep breathing, self mobilization to thoracic musculature with LAX ball. tiny bobbleheads, scapular retraction    Consulted and Agree with Plan of Care  Patient       Patient will benefit from skilled therapeutic intervention in order to improve the following deficits and impairments:  Increased fascial restricitons, Impaired sensation, Pain, Decreased mobility, Postural dysfunction, Decreased activity tolerance, Decreased endurance, Decreased range of motion, Decreased strength, Hypomobility, Impaired UE functional use  Visit Diagnosis: Cervicalgia  Other symptoms and signs involving the musculoskeletal system     Problem List Patient Active Problem List   Diagnosis Date Noted  . Encounter for screening colonoscopy 07/02/2018  . OSA (obstructive sleep apnea) 05/31/2018  . Benign essential HTN 05/31/2018  . Obesity (BMI 30-39.9) 05/31/2018   3:27 PM, 04/12/19 Jerene Pitch, DPT Physical Therapy with [facility]  [phone] office  [place] [address] [place], [place], [address] Phone: [phone]   Fax:  [phone]  Name: CASSANOVA VINSON MRN: [id number] Date of Birth: 1966-05-12

## 2019-04-14 ENCOUNTER — Ambulatory Visit (HOSPITAL_COMMUNITY): Payer: 59

## 2019-04-14 ENCOUNTER — Other Ambulatory Visit: Payer: Self-pay

## 2019-04-14 ENCOUNTER — Encounter (HOSPITAL_COMMUNITY): Payer: Self-pay

## 2019-04-14 DIAGNOSIS — M542 Cervicalgia: Secondary | ICD-10-CM

## 2019-04-14 DIAGNOSIS — R29898 Other symptoms and signs involving the musculoskeletal system: Secondary | ICD-10-CM

## 2019-04-14 NOTE — Therapy (Signed)
[place], [place], [address] Phone: [phone]   Fax:  [phone]  Physical Therapy Treatment  Patient Details  Name: William Kim MRN: [id number] Date of Birth: Mar 30, 1966 Referring Provider (PT): Eustace Moore, MD    Encounter Date: 04/14/2019  PT End of Session - 04/14/19 0942    Visit Number  6    Number of Visits  12    Date for PT Re-Evaluation  05/03/19   Mini re-assess 03/13/19   Authorization Type  Generic [employer] 60 days 0 used All therapies    Authorization Time Period  03/22/19 - 05/03/19    Authorization - Visit Number  6    Authorization - Number of Visits  60    PT Start Time  [id number]    PT Stop Time  0943    PT Time Calculation (min)  39 min    Activity Tolerance  Patient tolerated treatment well;No increased pain    Behavior During Therapy  WFL for tasks assessed/performed       Past Medical History:  Diagnosis Date  . A-fib ([place])   . Anxiety   . Cervicalgia   . High cholesterol   . Hypertension   . OSA (obstructive sleep apnea) 05/31/2018   Severe OSA with AHI 79/h with significant oxygen desaturations as low as 58%.    Past Surgical History:  Procedure Laterality Date  . COLONOSCOPY WITH PROPOFOL N/A 08/02/2018   Procedure: COLONOSCOPY WITH PROPOFOL;  Surgeon: Daneil Dolin, MD;  [facility];  Service: Endoscopy;  Laterality: N/A;  12:15pm  . None      There were no vitals filed for this visit.  Subjective Assessment - 04/14/19 0906    Subjective  Pt reports always sore at the base of his neck, down his spine. Pt reports soreness a little after last session.    Pertinent History  Cervical Surgery 12/10/18    Limitations  Lifting;House hold activities    Diagnostic tests  Negative Chest X-ray    Patient Stated Goals  Decreased pain in shoulder and neck    Currently in Pain?  Yes    Pain Score  5     Pain Location  Neck    Pain Orientation  Right    Pain  Descriptors / Indicators  Aching;Dull    Pain Type  Chronic pain    Pain Onset  More than a month ago    Pain Frequency  Intermittent    Aggravating Factors   unknown    Pain Relieving Factors  pain medicine    Effect of Pain on Daily Activities  moderately affects            [facility] Adult PT Treatment/Exercise - 04/14/19 0001      Neck Exercises: Seated   Shoulder Rolls  Forwards;Backwards;10 reps    Shoulder Rolls Limitations  with good posture    Other Seated Exercise  scapular retraction, with good posture, x10 reps      Neck Exercises: Supine   Neck Retraction  10 reps    Neck Retraction Limitations  3 sec hold    Cervical Rotation  10 reps    Cervical Rotation Limitations  CW/CCW    Other Supine Exercise  scapular retraction, 2x12 reps, 5 sec holds       Manual Therapy   Manual Therapy  Soft tissue mobilization    Manual therapy comments  All manual was completed separately  from other skilled interventions    Soft tissue mobilization  Pt supine, STM to R cervical paraspinals, upper trap, suboccipitals    Manual Traction  with suboccipital pressure, good resulte, x5 minutes      Neck Exercises: Stretches   Upper Trapezius Stretch  Left;2 reps;30 seconds   overhand pressure from therapist   Levator Stretch  Left;2 reps;30 seconds   overhand pressure from therapist            PT Education - 04/14/19 0943    Education Details  Exercise technique, continue HEP    Person(s) Educated  Patient    Methods  Explanation    Comprehension  Verbalized understanding       PT Short Term Goals - 03/30/19 1124      PT SHORT TERM GOAL #1   Title  Patient will report understanding and regular compliance with HEP to improve cervical ROM, decrease pain, and improve overall functional mobility.    Time  3    Period  Weeks    Status  On-going    Target Date  04/12/19      PT SHORT TERM GOAL #2   Title  Patient will report a decrease in pain intensity to no more than a  4/10 over the course of a 1 week period for  improved tolerance to daily activities and to return to work.    Time  3    Period  Weeks    Status  On-going    Target Date  04/12/19        PT Long Term Goals - 03/30/19 1125      PT LONG TERM GOAL #1   Title  Patient will report a decrease in pain intensity to no more than a 2/10 over the course of a 1 week period for  improved tolerance to daily activities and to return to work.    Time  6    Period  Weeks    Status  On-going      PT LONG TERM GOAL #2   Title  Patient will demonstrate cervical AROM WFL and with minimal to no reports of pain in order to interact with environment more safely and with decreased irritibility of symptoms.    Time  6    Period  Weeks    Status  On-going      PT LONG TERM GOAL #3   Title  Patient will demonstrate ability to perform shoulder height lifting of box for 10 repetitions without pain to improve endurance to return to work and household chores.    Time  6    Period  Weeks    Status  On-going            Plan - 04/14/19 0943    Clinical Impression Statement  Began treatment session with manual STM to R cervical paraspinals, upper trap and suboccipitals. Added chin tucks with 3 sec hold for isometric strengthening. Pt with increased burning and tingling in right cervical paraspinals and upper trap with cervical rotation CW and CCW, improving with decreased AROM. Pt denies radiating paresthesia down L UE, it stays in neck to mid shoulder. Attempted postural strengthening in upright sitting this date with good form. Pt denies pain with scap retraction and shoulder tolls. Therapist added over pressure with seated upper trap and levator scap scap stretches to assist in decreasing tension and correct form. Pt reports slight improvement in pain at EOS. Continue to progress as able.  Personal Factors and Comorbidities  Comorbidity 2    Comorbidities  Anxiety, A-Fib    Examination-Activity Limitations   Lift;Reach Overhead    Examination-Participation Restrictions  Community Activity;Yard Work;Meal Prep;Driving    Stability/Clinical Decision Making  Stable/Uncomplicated    Rehab Potential  Good    PT Frequency  2x / week    PT Duration  6 weeks    PT Treatment/Interventions  ADLs/Self Care Home Management;Cryotherapy;Electrical Stimulation;Moist Heat;Traction;Functional mobility training;Therapeutic activities;Therapeutic exercise;Neuromuscular re-education;Patient/family education;Manual techniques;Scar mobilization;Passive range of motion;Energy conservation;Taping    PT Next Visit Plan  thoracic mobilization, diaphragm release, Continue STM to cervical paraspinals (anterior and posterior attachments), anterior scalenes, and upper trap. Painfree PROM.    PT Home Exercise Plan  deep breathing, self mobilization to thoracic musculature with LAX ball. tiny bobbleheads, scapular retraction    Consulted and Agree with Plan of Care  Patient       Patient will benefit from skilled therapeutic intervention in order to improve the following deficits and impairments:  Increased fascial restricitons, Impaired sensation, Pain, Decreased mobility, Postural dysfunction, Decreased activity tolerance, Decreased endurance, Decreased range of motion, Decreased strength, Hypomobility, Impaired UE functional use  Visit Diagnosis: Cervicalgia  Other symptoms and signs involving the musculoskeletal system     Problem List Patient Active Problem List   Diagnosis Date Noted  . Encounter for screening colonoscopy 07/02/2018  . OSA (obstructive sleep apnea) 05/31/2018  . Benign essential HTN 05/31/2018  . Obesity (BMI 30-39.9) 05/31/2018     Talbot Grumbling PT, DPT 04/14/19, 9:48 AM [place], [place], [address] Phone: [phone]   Fax:  [phone]  Name: William Kim MRN: [id number] Date of Birth:  August 29, 1965

## 2019-04-18 ENCOUNTER — Ambulatory Visit (INDEPENDENT_AMBULATORY_CARE_PROVIDER_SITE_OTHER): Payer: 59 | Admitting: Otolaryngology

## 2019-04-18 ENCOUNTER — Encounter (HOSPITAL_COMMUNITY): Payer: 59

## 2019-04-18 ENCOUNTER — Other Ambulatory Visit: Payer: Self-pay

## 2019-04-18 DIAGNOSIS — D37030 Neoplasm of uncertain behavior of the parotid salivary glands: Secondary | ICD-10-CM | POA: Diagnosis not present

## 2019-04-19 ENCOUNTER — Other Ambulatory Visit: Payer: Self-pay

## 2019-04-19 ENCOUNTER — Encounter (HOSPITAL_COMMUNITY): Payer: Self-pay | Admitting: Physical Therapy

## 2019-04-19 ENCOUNTER — Ambulatory Visit (HOSPITAL_COMMUNITY): Payer: 59 | Admitting: Physical Therapy

## 2019-04-19 DIAGNOSIS — M542 Cervicalgia: Secondary | ICD-10-CM

## 2019-04-19 DIAGNOSIS — R29898 Other symptoms and signs involving the musculoskeletal system: Secondary | ICD-10-CM

## 2019-04-19 NOTE — Patient Instructions (Signed)
Supine Thoracic Mobilization Towel Roll Vertical Exercise image step1 Exercise image step2 5 minutes - then you can perform scapular retractions or overhead reaching (bringing arms up overhead with breath)- so if you perform on your bed/soft surface you may need to put magazines behind towel to keep towel from sinking into bed. just a towel roll behind head, not a big pillow.  Setup Begin lying with your knees bent and a towel roll positioned vertically along the middle of your back, hands resting on your stomach. Movement Stay in this position with your back relaxed. Tip Make sure your back is laying flat against the towel.

## 2019-04-19 NOTE — Therapy (Signed)
[place], [place], [address] Phone: [phone]   Fax:  [phone]  Physical Therapy Treatment  Patient Details  Name: William Kim MRN: [id number] Date of Birth: 1966/06/22 Referring Provider (PT): Eustace Moore, MD    Encounter Date: 04/19/2019  PT End of Session - 04/19/19 0926    Visit Number  7    Number of Visits  12    Date for PT Re-Evaluation  05/03/19   Mini re-assess 03/13/19   Authorization Type  Generic [employer] 60 days 0 used All therapies    Authorization Time Period  03/22/19 - 05/03/19    Authorization - Visit Number  7    Authorization - Number of Visits  60    PT Start Time  0920   pt 5 minutes late to session   PT Stop Time  1003    PT Time Calculation (min)  43 min    Activity Tolerance  Patient tolerated treatment well;No increased pain    Behavior During Therapy  WFL for tasks assessed/performed       Past Medical History:  Diagnosis Date  . A-fib ([place])   . Anxiety   . Cervicalgia   . High cholesterol   . Hypertension   . OSA (obstructive sleep apnea) 05/31/2018   Severe OSA with AHI 79/h with significant oxygen desaturations as low as 58%.    Past Surgical History:  Procedure Laterality Date  . COLONOSCOPY WITH PROPOFOL N/A 08/02/2018   Procedure: COLONOSCOPY WITH PROPOFOL;  Surgeon: Daneil Dolin, MD;  [facility];  Service: Endoscopy;  Laterality: N/A;  12:15pm  . None      There were no vitals filed for this visit.  Subjective Assessment - 04/19/19 0925    Subjective  States overall he is getting better. States the symptoms in his arms are better, states the breathing is better. REports he can move and get the pain to come on, but it is not that bad and not as constant    Pertinent History  Cervical Surgery 12/10/18    Limitations  Lifting;House hold activities    Diagnostic tests  Negative Chest X-ray    Patient Stated Goals  Decreased pain in shoulder  and neck    Currently in Pain?  Yes    Pain Score  3     Pain Location  Neck    Pain Orientation  Right;Posterior    Pain Descriptors / Indicators  Aching;Dull    Pain Onset  More than a month ago    Pain Relieving Factors  with cervical ROM         [facility] PT Assessment - 04/19/19 0001      Assessment   Medical Diagnosis  Cervicalgia    Referring Provider (PT)  Eustace Moore, MD     Onset Date/Surgical Date  12/10/18    Hand Dominance  Left    Next MD Visit  04/28/19    Prior Therapy  None                   [facility] Adult PT Treatment/Exercise - 04/19/19 0001      Neck Exercises: Standing   Neck Retraction  5 reps;5 secs   3 sets   Other Standing Exercises  posture isometrics at wall - palms pressing into wall  3x5, 5" holds - chin level with horizon    Other Standing Exercises  cervical ROT at wall  3x5 bilat      Neck Exercises: Supine   Neck Retraction  --   2x12, 5" holds   Cervical Rotation  5 reps;Both   3 sets   Other Supine Exercise  --    Other Supine Exercise  towel roll down t- spine for thoracic extension - 5 minutes --> added scapular retraction 2x12, 5" holds --> then shoulder flexion 2x12, 5" holds             PT Education - 04/19/19 1004    Education Details  educated patient on posture- keeping chin level with horizon and not leaning back in lumbar spine to compensate for forward head posture    Person(s) Educated  Patient    Methods  Explanation;Demonstration    Comprehension  Verbalized understanding;Returned demonstration       PT Short Term Goals - 04/19/19 0933      PT SHORT TERM GOAL #1   Title  Patient will report understanding and regular compliance with HEP to improve cervical ROM, decrease pain, and improve overall functional mobility.    Time  3    Period  Weeks    Status  On-going    Target Date  04/12/19      PT SHORT TERM GOAL #2   Title  Patient will report a decrease in pain intensity to no more than a 4/10 over  the course of a 1 week period for  improved tolerance to daily activities and to return to work.    Baseline  04/19/19: reports 4/10 pain at it's worse over the last week    Time  3    Period  Weeks    Status  Achieved    Target Date  04/12/19        PT Long Term Goals - 04/19/19 0934      PT LONG TERM GOAL #1   Title  Patient will report a decrease in pain intensity to no more than a 2/10 over the course of a 1 week period for  improved tolerance to daily activities and to return to work.    Time  6    Period  Weeks    Status  On-going      PT LONG TERM GOAL #2   Title  Patient will demonstrate cervical AROM WFL and with minimal to no reports of pain in order to interact with environment more safely and with decreased irritibility of symptoms.    Time  6    Period  Weeks    Status  On-going      PT LONG TERM GOAL #3   Title  Patient will demonstrate ability to perform shoulder height lifting of box for 10 repetitions without pain to improve endurance to return to work and household chores.    Time  6    Period  Weeks    Status  On-going            Plan - 04/19/19 [id number]    Clinical Impression Statement  Tolerated session very well today. Added gentle thoracic extension with rolled towel down spine and UE movements. Pain decreased and patient reported improved muscle relaxation with this exercise. Added to HEP. Transitioned to standing chin tucks in standing and this was challenging as patient could not perform chin tuck without compensating with cervical flexion/extension and needed hand over chin assist to perform AAROM chin tucks standing. Educated patient on standing posture as he has excessive forward head and compensates with lumbar  extension and cervical extension. Patient improving in overall symptoms and cervical mobility. Will continue to work on cervical and thoracic mobility and posture.    Personal Factors and Comorbidities  Comorbidity 2    Comorbidities  Anxiety,  A-Fib    Examination-Activity Limitations  Lift;Reach Overhead    Examination-Participation Restrictions  Community Activity;Yard Work;Meal Prep;Driving    Stability/Clinical Decision Making  Stable/Uncomplicated    Rehab Potential  Good    PT Frequency  2x / week    PT Duration  6 weeks    PT Treatment/Interventions  ADLs/Self Care Home Management;Cryotherapy;Electrical Stimulation;Moist Heat;Traction;Functional mobility training;Therapeutic activities;Therapeutic exercise;Neuromuscular re-education;Patient/family education;Manual techniques;Scar mobilization;Passive range of motion;Energy conservation;Taping    PT Next Visit Plan  thoracic mobilization, diaphragm release, Continue STM to cervical paraspinals (anterior and posterior attachments), anterior scalenes, and upper trap. Painfree PROM.    PT Home Exercise Plan  deep breathing, self mobilization to thoracic musculature with LAX ball. tiny bobbleheads, scapular retraction; 10/20 thoracic extension will towell roll down spine    Consulted and Agree with Plan of Care  Patient       Patient will benefit from skilled therapeutic intervention in order to improve the following deficits and impairments:  Increased fascial restricitons, Impaired sensation, Pain, Decreased mobility, Postural dysfunction, Decreased activity tolerance, Decreased endurance, Decreased range of motion, Decreased strength, Hypomobility, Impaired UE functional use  Visit Diagnosis: Cervicalgia  Other symptoms and signs involving the musculoskeletal system     Problem List Patient Active Problem List   Diagnosis Date Noted  . Encounter for screening colonoscopy 07/02/2018  . OSA (obstructive sleep apnea) 05/31/2018  . Benign essential HTN 05/31/2018  . Obesity (BMI 30-39.9) 05/31/2018    10:08 AM, 04/19/19 Jerene Pitch, DPT Physical Therapy with [facility]  [phone] office  [place] [address] [place], [place], [address] Phone: [phone]   Fax:  [phone]  Name: William Kim MRN: [id number] Date of Birth: 1965/10/11

## 2019-04-21 ENCOUNTER — Ambulatory Visit (HOSPITAL_COMMUNITY): Payer: 59 | Admitting: Physical Therapy

## 2019-04-21 ENCOUNTER — Other Ambulatory Visit: Payer: Self-pay

## 2019-04-21 DIAGNOSIS — R29898 Other symptoms and signs involving the musculoskeletal system: Secondary | ICD-10-CM

## 2019-04-21 DIAGNOSIS — M542 Cervicalgia: Secondary | ICD-10-CM

## 2019-04-21 NOTE — Therapy (Signed)
[name], [place], [address] Phone: [phone]   Fax:  [phone]  Physical Therapy Treatment  Patient Details  Name: William Kim MRN: [id number] Date of Birth: [birth year] Referring Provider (PT): Eustace Moore, MD    Encounter Date: 04/21/2019  PT End of Session - 04/21/19 1052    Visit Number  8    Number of Visits  12    Date for PT Re-Evaluation  05/03/19   Mini re-assess 03/13/19   Authorization Type  Generic [employer] 60 days 0 used All therapies    Authorization Time Period  03/22/19 - 05/03/19    Authorization - Visit Number  8    Authorization - Number of Visits  60    PT Start Time  [id number]    Activity Tolerance  Patient tolerated treatment well;No increased pain    Behavior During Therapy  WFL for tasks assessed/performed       Past Medical History:  Diagnosis Date  . A-fib ([place])   . Anxiety   . Cervicalgia   . High cholesterol   . Hypertension   . OSA (obstructive sleep apnea) 05/31/2018   Severe OSA with AHI 79/h with significant oxygen desaturations as low as 58%.    Past Surgical History:  Procedure Laterality Date  . COLONOSCOPY WITH PROPOFOL N/A 08/02/2018   Procedure: COLONOSCOPY WITH PROPOFOL;  Surgeon: Daneil Dolin, MD;  [facility];  Service: Endoscopy;  Laterality: N/A;  12:15pm  . None      There were no vitals filed for this visit.  Subjective Assessment - 04/21/19 1050    Subjective  States that he did the towel roll exercises twice since last visit and he has been doing the LAX ball exercise too and that seems to be helping the most. States that he feels better in his arms and chest but the pain in his back/neck is still there.    Pertinent History  Cervical Surgery 12/10/18    Limitations  Lifting;House hold activities    Diagnostic tests  Negative Chest X-ray    Patient Stated Goals  Decreased pain in shoulder and neck    Currently in Pain?  Yes    Pain  Score  4     Pain Location  Neck    Pain Orientation  Posterior;Right;Medial;Lateral    Pain Descriptors / Indicators  Burning;Other (Comment)   stinging   Pain Onset  More than a month ago         [facility] PT Assessment - 04/21/19 0001      Assessment   Medical Diagnosis  Cervicalgia    Referring Provider (PT)  Eustace Moore, MD     Onset Date/Surgical Date  12/10/18    Hand Dominance  Left    Next MD Visit  04/28/19    Prior Therapy  None                   [facility] Adult PT Treatment/Exercise - 04/21/19 0001      Neck Exercises: Standing   Other Standing Exercises  LAX ball to thoracic musculature 5 minutes       Neck Exercises: Supine   Other Supine Exercise  modified book stretch 2x6, 10" holds B     Other Supine Exercise  serratus punches 2x12, 5" holds B               PT Short Term Goals - 04/19/19 [id number]  PT SHORT TERM GOAL #1   Title  Patient will report understanding and regular compliance with HEP to improve cervical ROM, decrease pain, and improve overall functional mobility.    Time  3    Period  Weeks    Status  On-going    Target Date  04/12/19      PT SHORT TERM GOAL #2   Title  Patient will report a decrease in pain intensity to no more than a 4/10 over the course of a 1 week period for  improved tolerance to daily activities and to return to work.    Baseline  04/19/19: reports 4/10 pain at it's worse over the last week    Time  3    Period  Weeks    Status  Achieved    Target Date  04/12/19        PT Long Term Goals - 04/19/19 0934      PT LONG TERM GOAL #1   Title  Patient will report a decrease in pain intensity to no more than a 2/10 over the course of a 1 week period for  improved tolerance to daily activities and to return to work.    Time  6    Period  Weeks    Status  On-going      PT LONG TERM GOAL #2   Title  Patient will demonstrate cervical AROM WFL and with minimal to no reports of pain in order to interact  with environment more safely and with decreased irritibility of symptoms.    Time  6    Period  Weeks    Status  On-going      PT LONG TERM GOAL #3   Title  Patient will demonstrate ability to perform shoulder height lifting of box for 10 repetitions without pain to improve endurance to return to work and household chores.    Time  6    Period  Weeks    Status  On-going            Plan - 04/21/19 1053    Clinical Impression Statement  Focused on thoracic and scapular mobility today. Patient tolerated this very well. Continued burning pain but this decreased with rotational exercise and patient reported no burning pain end of session. Patient reported he wished he would continue to feel this good. Reminded and educated patient on typical healing process and how his pain has already significantly improved since he started in physical therapy.    Personal Factors and Comorbidities  Comorbidity 2    Comorbidities  Anxiety, A-Fib    Examination-Activity Limitations  Lift;Reach Overhead    Examination-Participation Restrictions  Community Activity;Yard Work;Meal Prep;Driving    Stability/Clinical Decision Making  Stable/Uncomplicated    Rehab Potential  Good    PT Frequency  2x / week    PT Duration  6 weeks    PT Treatment/Interventions  ADLs/Self Care Home Management;Cryotherapy;Electrical Stimulation;Moist Heat;Traction;Functional mobility training;Therapeutic activities;Therapeutic exercise;Neuromuscular re-education;Patient/family education;Manual techniques;Scar mobilization;Passive range of motion;Energy conservation;Taping    PT Next Visit Plan  thoracic mobilization, diaphragm release, Continue STM to cervical paraspinals (anterior and posterior attachments), anterior scalenes, and upper trap. Painfree PROM.    PT Home Exercise Plan  deep breathing, self mobilization to thoracic musculature with LAX ball. tiny bobbleheads, scapular retraction; 10/20 thoracic extension will towell  roll down spine; book stretch, serratus punches    Consulted and Agree with Plan of Care  Patient       Patient  will benefit from skilled therapeutic intervention in order to improve the following deficits and impairments:  Increased fascial restricitons, Impaired sensation, Pain, Decreased mobility, Postural dysfunction, Decreased activity tolerance, Decreased endurance, Decreased range of motion, Decreased strength, Hypomobility, Impaired UE functional use  Visit Diagnosis: Cervicalgia  Other symptoms and signs involving the musculoskeletal system     Problem List Patient Active Problem List   Diagnosis Date Noted  . Encounter for screening colonoscopy 07/02/2018  . OSA (obstructive sleep apnea) 05/31/2018  . Benign essential HTN 05/31/2018  . Obesity (BMI 30-39.9) 05/31/2018    12:08 PM, 04/21/19 Jerene Pitch, DPT Physical Therapy with [facility]  [phone] office  [place] [address] [place], [place], [address] Phone: [phone]   Fax:  [phone]  Name: William Kim MRN: [id number] Date of Birth: Jun 14, 1966

## 2019-04-25 ENCOUNTER — Encounter (HOSPITAL_COMMUNITY): Payer: 59

## 2019-04-26 ENCOUNTER — Encounter (HOSPITAL_COMMUNITY): Payer: Self-pay | Admitting: Physical Therapy

## 2019-04-26 ENCOUNTER — Ambulatory Visit (HOSPITAL_COMMUNITY): Payer: 59 | Admitting: Physical Therapy

## 2019-04-26 ENCOUNTER — Other Ambulatory Visit: Payer: Self-pay

## 2019-04-26 DIAGNOSIS — R29898 Other symptoms and signs involving the musculoskeletal system: Secondary | ICD-10-CM

## 2019-04-26 DIAGNOSIS — M542 Cervicalgia: Secondary | ICD-10-CM

## 2019-04-26 NOTE — Therapy (Signed)
[facility] [place], [place], [address] Phone: [phone]   Fax:  [phone]  Physical Therapy Treatment  Patient Details  Name: William Kim MRN: [id number] Date of Birth: 08-Mar-1966 Referring Provider (PT): Eustace Moore, MD    Encounter Date: 04/26/2019  PT End of Session - 04/26/19 1047    Visit Number  9    Number of Visits  12    Date for PT Re-Evaluation  05/03/19   Mini re-assess 03/13/19   Authorization Type  Generic [employer] 60 days 0 used All therapies    Authorization Time Period  03/22/19 - 05/03/19    Authorization - Visit Number  9    Authorization - Number of Visits  60    PT Start Time  [id number]    PT Stop Time  1127    PT Time Calculation (min)  40 min    Activity Tolerance  Patient tolerated treatment well;No increased pain    Behavior During Therapy  WFL for tasks assessed/performed       Past Medical History:  Diagnosis Date  . A-fib ([place])   . Anxiety   . Cervicalgia   . High cholesterol   . Hypertension   . OSA (obstructive sleep apnea) 05/31/2018   Severe OSA with AHI 79/h with significant oxygen desaturations as low as 58%.    Past Surgical History:  Procedure Laterality Date  . COLONOSCOPY WITH PROPOFOL N/A 08/02/2018   Procedure: COLONOSCOPY WITH PROPOFOL;  Surgeon: Daneil Dolin, MD;  [facility];  Service: Endoscopy;  Laterality: N/A;  12:15pm  . None      There were no vitals filed for this visit.  Subjective Assessment - 04/26/19 1047    Subjective  States that he did his exercises and he felt pretty good. Yesterday they went on a drive and his pain returned. He is frustrated because he feels he needs to move and do his exercises to feel better. States he is still taking his pain meds but he does forget to take them sometimes. Reprots that last time he took them was last night.    Pertinent History  Cervical Surgery 12/10/18    Limitations  Lifting;House hold activities     Diagnostic tests  Negative Chest X-ray    Patient Stated Goals  Decreased pain in shoulder and neck    Currently in Pain?  Yes    Pain Score  4     Pain Location  Neck    Pain Orientation  Right    Pain Onset  More than a month ago         [facility] PT Assessment - 04/26/19 0001      Assessment   Medical Diagnosis  Cervicalgia    Referring Provider (PT)  Eustace Moore, MD     Onset Date/Surgical Date  12/10/18    Hand Dominance  Left    Next MD Visit  04/28/19    Prior Therapy  None                   [facility] Adult PT Treatment/Exercise - 04/26/19 0001      Neck Exercises: Standing   Other Standing Exercises  shoulder extension with RTB 3x8, 5" holds bilateral    Other Standing Exercises  scapular retractuib RTB 3x8, 5"       Neck Exercises: Supine   Other Supine Exercise  serratus punches 2x12, 5" holds B  PT Education - 04/26/19 1101    Education Details  on surgery - how a fusion decreases mobility at one spot and a need for increased mobility results above and below joint. On how movement/exercises are important for conitnued healing.    Person(s) Educated  Patient    Methods  Explanation    Comprehension  Verbalized understanding       PT Short Term Goals - 04/19/19 0933      PT SHORT TERM GOAL #1   Title  Patient will report understanding and regular compliance with HEP to improve cervical ROM, decrease pain, and improve overall functional mobility.    Time  3    Period  Weeks    Status  On-going    Target Date  04/12/19      PT SHORT TERM GOAL #2   Title  Patient will report a decrease in pain intensity to no more than a 4/10 over the course of a 1 week period for  improved tolerance to daily activities and to return to work.    Baseline  04/19/19: reports 4/10 pain at it's worse over the last week    Time  3    Period  Weeks    Status  Achieved    Target Date  04/12/19        PT Long Term Goals - 04/19/19 0934      PT  LONG TERM GOAL #1   Title  Patient will report a decrease in pain intensity to no more than a 2/10 over the course of a 1 week period for  improved tolerance to daily activities and to return to work.    Time  6    Period  Weeks    Status  On-going      PT LONG TERM GOAL #2   Title  Patient will demonstrate cervical AROM WFL and with minimal to no reports of pain in order to interact with environment more safely and with decreased irritibility of symptoms.    Time  6    Period  Weeks    Status  On-going      PT LONG TERM GOAL #3   Title  Patient will demonstrate ability to perform shoulder height lifting of box for 10 repetitions without pain to improve endurance to return to work and household chores.    Time  6    Period  Weeks    Status  On-going            Plan - 04/26/19 1047    Clinical Impression Statement  Focused on education today as patient continues to have frustration with need to do exercises/movements to alleviate symptoms. Reminded patient of progress made since before and after surgery and educated ptietn on anticipated progress and typical healing progression. Anxiety about continued symptoms patient's limiting factor at the moment. Will continue to reassure patient and progress patient as tolerated. Provided patient with red theraband for home compliance.    Personal Factors and Comorbidities  Comorbidity 2    Comorbidities  Anxiety, A-Fib    Examination-Activity Limitations  Lift;Reach Overhead    Examination-Participation Restrictions  Community Activity;Yard Work;Meal Prep;Driving    Stability/Clinical Decision Making  Stable/Uncomplicated    Rehab Potential  Good    PT Frequency  2x / week    PT Duration  6 weeks    PT Treatment/Interventions  ADLs/Self Care Home Management;Cryotherapy;Electrical Stimulation;Moist Heat;Traction;Functional mobility training;Therapeutic activities;Therapeutic exercise;Neuromuscular re-education;Patient/family education;Manual  techniques;Scar mobilization;Passive range of motion;Energy  conservation;Taping    PT Next Visit Plan  scapular/thoracic mobilty, posterior chain strengthening, scapular strengthening.    PT Home Exercise Plan  deep breathing, self mobilization to thoracic musculature with LAX ball. tiny bobbleheads, scapular retraction; 10/20 thoracic extension will towell roll down spine; book stretch, serratus punches    Consulted and Agree with Plan of Care  Patient       Patient will benefit from skilled therapeutic intervention in order to improve the following deficits and impairments:  Increased fascial restricitons, Impaired sensation, Pain, Decreased mobility, Postural dysfunction, Decreased activity tolerance, Decreased endurance, Decreased range of motion, Decreased strength, Hypomobility, Impaired UE functional use  Visit Diagnosis: Cervicalgia  Other symptoms and signs involving the musculoskeletal system     Problem List Patient Active Problem List   Diagnosis Date Noted  . Encounter for screening colonoscopy 07/02/2018  . OSA (obstructive sleep apnea) 05/31/2018  . Benign essential HTN 05/31/2018  . Obesity (BMI 30-39.9) 05/31/2018    11:26 AM, 04/26/19 Jerene Pitch, DPT Physical Therapy with [facility]  [phone] office  [place] [address] [place], [place], [address] Phone: [phone]   Fax:  [phone]  Name: William Kim MRN: [id number] Date of Birth: [birth year]

## 2019-04-28 ENCOUNTER — Other Ambulatory Visit: Payer: Self-pay

## 2019-04-28 ENCOUNTER — Ambulatory Visit (HOSPITAL_COMMUNITY): Payer: 59 | Admitting: Physical Therapy

## 2019-04-28 ENCOUNTER — Encounter (HOSPITAL_COMMUNITY): Payer: Self-pay | Admitting: Physical Therapy

## 2019-04-28 DIAGNOSIS — M542 Cervicalgia: Secondary | ICD-10-CM | POA: Diagnosis not present

## 2019-04-28 DIAGNOSIS — R29898 Other symptoms and signs involving the musculoskeletal system: Secondary | ICD-10-CM

## 2019-04-28 NOTE — Patient Instructions (Signed)
.   Avoid - military presses or weighted overhead presses (talk to MD about this and about any other lifting restrictions) . Watch neck position with exercises and make sure chin/head does NOT stick out further than your chest . Perform shoulder/back exercises every other day, in between can perform leg exercises/treadmill/core . Perform stretches/ mobility exercises from Physical therapy every day (remember towel roll down spine with arm movements).

## 2019-04-28 NOTE — Therapy (Signed)
[facility] [address] [place], [place], [address] Phone: [phone]   Fax:  [phone]  Physical Therapy Treatment and Discharge Note  Patient Details  Name: William Kim MRN: [id number] Date of Birth: February 14, 1966 Referring Provider (PT): Eustace Moore, MD   PHYSICAL THERAPY DISCHARGE SUMMARY  Visits from Start of Care: 10  Current functional level related to goals / functional outcomes: Continued pain when not performing exercises. Met all short term goals and all but one long term goal at this time.    Remaining deficits:  Pain in cervical spine when not performing mobility exercises - first thing in the morning.   Education / Equipment: On HEP, on restrictions with return to lifting routine Plan: Patient agrees to discharge.  Patient goals were partially met. Patient is being discharged due to being pleased with the current functional level.  ?????        Encounter Date: 04/28/2019  PT End of Session - 04/28/19 0925    Visit Number  10    Number of Visits  12    Date for PT Re-Evaluation  05/03/19   Mini re-assess 03/13/19   Authorization Type  Generic [employer] 60 days 0 used All therapies    Authorization Time Period  03/22/19 - 05/03/19    Authorization - Visit Number  10    Authorization - Number of Visits  60    PT Start Time  0923    PT Stop Time  1003    PT Time Calculation (min)  40 min    Activity Tolerance  Patient tolerated treatment well;No increased pain    Behavior During Therapy  WFL for tasks assessed/performed       Past Medical History:  Diagnosis Date  . A-fib ([place])   . Anxiety   . Cervicalgia   . High cholesterol   . Hypertension   . OSA (obstructive sleep apnea) 05/31/2018   Severe OSA with AHI 79/h with significant oxygen desaturations as low as 58%.    Past Surgical History:  Procedure Laterality Date  . COLONOSCOPY WITH PROPOFOL N/A 08/02/2018   Procedure: COLONOSCOPY WITH PROPOFOL;   Surgeon: Daneil Dolin, MD;  [facility];  Service: Endoscopy;  Laterality: N/A;  12:15pm  . None      There were no vitals filed for this visit.  Subjective Assessment - 04/28/19 1254    Subjective  Patient reports he feels about 92% better since the start of PT. When he does his exercises he is pain free, but he is frustrated as he feels like he has to do his exercises daily to get rid of his symptoms.    Pertinent History  Cervical Surgery 12/10/18    Limitations  Lifting;House hold activities    Diagnostic tests  Negative Chest X-ray    Patient Stated Goals  Decreased pain in shoulder and neck    Pain Score  3     Pain Location  Neck    Pain Orientation  Right    Pain Onset  More than a month ago         [facility] PT Assessment - 04/28/19 0001      Assessment   Medical Diagnosis  Cervicalgia    Referring Provider (PT)  Eustace Moore, MD     Onset Date/Surgical Date  12/10/18    Hand Dominance  Left    Next MD Visit  04/28/19    Prior Therapy  None  Observation/Other Assessments   Focus on Therapeutic Outcomes (FOTO)   22% limited   was 46% limited      ROM / Strength   AROM / PROM / Strength  Strength      AROM   AROM Assessment Site  Cervical    Cervical Flexion  WFL    Cervical Extension  45    Cervical - Right Side Bend  WFL   pain on the right    Cervical - Left Side Bend  WFL    Cervical - Right Rotation  [facility]    Cervical - Left Rotation  [facility]      Strength   Strength Assessment Site  Shoulder    Right/Left Shoulder  Right;Left    Right Shoulder Flexion  4+/5    Right Shoulder ABduction  4+/5    Right Shoulder Internal Rotation  4+/5    Right Shoulder External Rotation  4/5    Left Shoulder Flexion  4+/5    Left Shoulder ABduction  4+/5    Left Shoulder Internal Rotation  4+/5    Left Shoulder External Rotation  4/5                           PT Education - 04/28/19 1013    Education Details  on HEP, continued  exercisess, restrictions with returning to lifting/strength training, progress made thus far.    Person(s) Educated  Patient    Methods  Explanation;Handout    Comprehension  Verbalized understanding       PT Short Term Goals - 04/28/19 0925      PT SHORT TERM GOAL #1   Title  Patient will report understanding and regular compliance with HEP to improve cervical ROM, decrease pain, and improve overall functional mobility.    Time  3    Period  Weeks    Status  Achieved    Target Date  04/12/19      PT SHORT TERM GOAL #2   Title  Patient will report a decrease in pain intensity to no more than a 4/10 over the course of a 1 week period for  improved tolerance to daily activities and to return to work.    Baseline  04/28/19: reports 3.5/10 pain at it's worse over the last week    Time  3    Period  Weeks    Status  Achieved    Target Date  04/12/19        PT Long Term Goals - 04/28/19 0928      PT LONG TERM GOAL #1   Title  Patient will report a decrease in pain intensity to no more than a 2/10 over the course of a 1 week period for  improved tolerance to daily activities and to return to work.    Baseline  04/28/19 3.5/10 over the last week when he doesn't take pain pill or do his exercise    Time  6    Period  Weeks    Status  On-going      PT LONG TERM GOAL #2   Title  Patient will demonstrate cervical AROM WFL and with minimal to no reports of pain in order to interact with environment more safely and with decreased irritibility of symptoms.    Baseline  04/25/19 - only side bending to the right mildly painful    Time  6    Period  Weeks  Status  Achieved      PT LONG TERM GOAL #3   Title  Patient will demonstrate ability to perform shoulder height lifting of box for 10 repetitions without pain to improve endurance to return to work and household chores.    Baseline  04/25/19 able    Time  6    Period  Weeks    Status  Achieved            Plan - 04/28/19  8341    Clinical Impression Statement  Patient present for a reassessment on this date. He has made great progress and met all short-term goals and all but one long term goal at this time. He continues to have right sided pain with right side bending but this is abolished when he performs his thoracic mobility exercises. Patient interested in transitioning back to weight lifting, educated patient in precautions, form and to also follow up with MD about any further restrictions at his appointment today. At this time, patient is to discharge from physical therapy secondary to content with progress and independence in HEP. Answered all questions prior to discharge.    Personal Factors and Comorbidities  Comorbidity 2    Comorbidities  Anxiety, A-Fib    Examination-Activity Limitations  Lift;Reach Overhead    Examination-Participation Restrictions  Community Activity;Yard Work;Meal Prep;Driving    Stability/Clinical Decision Making  Stable/Uncomplicated    Rehab Potential  Good    PT Frequency  2x / week    PT Duration  6 weeks    PT Treatment/Interventions  ADLs/Self Care Home Management;Cryotherapy;Electrical Stimulation;Moist Heat;Traction;Functional mobility training;Therapeutic activities;Therapeutic exercise;Neuromuscular re-education;Patient/family education;Manual techniques;Scar mobilization;Passive range of motion;Energy conservation;Taping    PT Next Visit Plan  patient to discharge from PT to HEP    PT Home Exercise Plan  deep breathing, self mobilization to thoracic musculature with LAX ball. tiny bobbleheads, scapular retraction; 10/20 thoracic extension will towell roll down spine; book stretch, serratus punches    Consulted and Agree with Plan of Care  Patient       Patient will benefit from skilled therapeutic intervention in order to improve the following deficits and impairments:  Increased fascial restricitons, Impaired sensation, Pain, Decreased mobility, Postural dysfunction,  Decreased activity tolerance, Decreased endurance, Decreased range of motion, Decreased strength, Hypomobility, Impaired UE functional use  Visit Diagnosis: Cervicalgia  Other symptoms and signs involving the musculoskeletal system     Problem List Patient Active Problem List   Diagnosis Date Noted  . Encounter for screening colonoscopy 07/02/2018  . OSA (obstructive sleep apnea) 05/31/2018  . Benign essential HTN 05/31/2018  . Obesity (BMI 30-39.9) 05/31/2018   12:54 PM, 04/28/19 Jerene Pitch, DPT Physical Therapy with [facility]  [phone] office  [facility] [address] [place], [place], [address] Phone: [phone]   Fax:  [phone]  Name: MALAK ORANTES MRN: [id number] Date of Birth: May 04, 1966

## 2019-05-03 ENCOUNTER — Encounter (HOSPITAL_COMMUNITY): Payer: 59 | Admitting: Physical Therapy

## 2019-05-05 ENCOUNTER — Encounter (HOSPITAL_COMMUNITY): Payer: 59 | Admitting: Physical Therapy

## 2019-05-10 ENCOUNTER — Encounter (HOSPITAL_COMMUNITY): Payer: 59 | Admitting: Physical Therapy

## 2019-05-13 ENCOUNTER — Other Ambulatory Visit: Payer: Self-pay

## 2019-05-13 ENCOUNTER — Ambulatory Visit
Admission: EM | Admit: 2019-05-13 | Discharge: 2019-05-13 | Disposition: A | Discharge status: Home / Self Care | Payer: 59 | Attending: Emergency Medicine | Admitting: Emergency Medicine

## 2019-05-13 DIAGNOSIS — F411 Generalized anxiety disorder: Secondary | ICD-10-CM | POA: Diagnosis not present

## 2019-05-13 MED ORDER — HYDROXYZINE HCL 25 MG PO TABS: 25.0000 mg | ORAL_TABLET | Freq: Four times a day (QID) | ORAL | 0 refills | Status: DC

## 2019-05-13 NOTE — Discharge Instructions (Addendum)
Follow up with Behavioral health Rest and drink fluids Eat a well-balanced diet, and avoid excessive caffeine intake Continue with hydroxyzine nightly for symptom relief Some things you may try doing to help alleviate your symptoms include: keeping a journal, exercise, talking to a friend or relative, listening to music, going for a walk or hike outside, or other activities that you may find enjoyable PCP assistance initiated Recommending further evaluation and management with PCP Return or go to ER if you have any new or worsening symptoms such as fever, chills, fatigue, worsening shortness of breath, wheezing, chest pain, nausea, vomiting, abdominal pain, changes in bowel or bladder habits, etc..Marland Kitchen

## 2019-05-13 NOTE — ED Triage Notes (Signed)
Pt presents to UC w/ c/o increased anxiety everyday. He states he needs a refill on his anxiety medications.

## 2019-05-13 NOTE — ED Notes (Signed)
Pt given list of Primary care offices in the area.

## 2019-05-13 NOTE — ED Provider Notes (Addendum)
[facility]    CSN: [id number] Arrival date & time: 05/13/19  1519      History   Chief Complaint Chief Complaint  Patient presents with  . Anxiety    HPI William Kim is a 53 y.o. male.   The history is provided by the patient. No language interpreter was used.  Anxiety This is a chronic problem. The current episode started more than 1 week ago. The problem occurs every several days. The problem has been gradually worsening. Pertinent negatives include no chest pain, no abdominal pain and no shortness of breath. Nothing aggravates the symptoms. The symptoms are relieved by medications. He has tried nothing for the symptoms.    Past Medical History:  Diagnosis Date  . A-fib ([place])   . Anxiety   . Cervicalgia   . High cholesterol   . Hypertension   . OSA (obstructive sleep apnea) 05/31/2018   Severe OSA with AHI 79/h with significant oxygen desaturations as low as 58%.    Patient Active Problem List   Diagnosis Date Noted  . Encounter for screening colonoscopy 07/02/2018  . OSA (obstructive sleep apnea) 05/31/2018  . Benign essential HTN 05/31/2018  . Obesity (BMI 30-39.9) 05/31/2018    Past Surgical History:  Procedure Laterality Date  . COLONOSCOPY WITH PROPOFOL N/A 08/02/2018   Procedure: COLONOSCOPY WITH PROPOFOL;  Surgeon: Daneil Dolin, MD;  [facility];  Service: Endoscopy;  Laterality: N/A;  12:15pm  . NERVE SURGERY    . None         Home Medications    Prior to Admission medications   Medication Sig Start Date End Date Taking? Authorizing Provider  alprazolam Duanne Moron) 2 MG tablet Take 1 tablet by mouth 3 (three) times daily. 03/01/19   [provider]  Ascorbic Acid (VITAMIN C PO) Take 1 tablet by mouth daily.    [provider]  aspirin EC 81 MG tablet Take 81 mg by mouth daily.    [provider]  buPROPion (WELLBUTRIN SR) 200 MG 12 hr tablet Take 1 tablet by mouth 2 (two) times daily. 03/01/19    [provider]  CARTIA XT 180 MG 24 hr capsule TAKE 1 CAPSULE EVERY MORNING 08/27/18   Sherran Needs, NP  diclofenac (VOLTAREN) 75 MG EC tablet Take 1 tablet by mouth 3 (three) times daily. 04/01/19   [provider]  gabapentin (NEURONTIN) 300 MG capsule Take 1 capsule by mouth 3 (three) times daily. 03/17/19   [provider]  hydrOXYzine (ATARAX/VISTARIL) 25 MG tablet Take 1 tablet (25 mg total) by mouth every 6 (six) hours as needed for anxiety. Patient not taking: Reported on 04/08/2019 02/24/19   Long, Wonda Olds, MD  hydrOXYzine (ATARAX/VISTARIL) 25 MG tablet Take 1 tablet (25 mg total) by mouth every 6 (six) hours. 05/13/19   Jordis Repetto, Darrelyn Hillock, FNP  LATUDA 80 MG TABS tablet Take 1 tablet by mouth daily. 03/01/19   [provider]  methocarbamol (ROBAXIN) 750 MG tablet Take 750 mg by mouth every 8 (eight) hours as needed. 03/16/19   [provider]  mirtazapine (REMERON) 30 MG tablet Take 1 tablet by mouth daily. 03/01/19   [provider]  Misc Natural Products (HEALTHY LIVER PO) Take 1 capsule by mouth at bedtime.    [provider]  Omega-3 Fatty Acids (FISH OIL PO) Take 1 capsule by mouth daily.     [provider]  oxyCODONE (ROXICODONE) 15 MG immediate release  tablet Take 2 tablets by mouth 4 (four) times daily as needed. 03/02/19   [provider]  QUEtiapine (SEROQUEL) 300 MG tablet Take 1 tablet by mouth daily. 12/20/18   [provider]  sildenafil (VIAGRA) 100 MG tablet Take 1 tablet by mouth daily as needed. 03/16/19   [provider]  simvastatin (ZOCOR) 20 MG tablet Take 20 mg by mouth daily.    [provider]  valACYclovir (VALTREX) 500 MG tablet Take 1 tablet by mouth daily. 03/30/16   [provider]    Family History Family History  Problem Relation Age of Onset  . Healthy Mother   . Healthy Father   . Colon cancer Neg Hx   . Colon polyps Neg Hx     Social  History Social History   Tobacco Use  . Smoking status: Former Research scientist (life sciences)  . Smokeless tobacco: Never Used  . Tobacco comment: quit smoking in 2012/2013, quit smoking August 16th  Substance Use Topics  . Alcohol use: Not Currently    Comment: "1-2 beers and maybe a mixed drink a day"  . Drug use: No     Allergies   Patient has no known allergies.   Review of Systems Review of Systems  Constitutional: Negative for activity change, appetite change, chills, fatigue and fever.  Respiratory: Negative for cough, chest tightness and shortness of breath.   Cardiovascular: Negative for chest pain and palpitations.  Gastrointestinal: Negative for abdominal pain and constipation.  Psychiatric/Behavioral: Negative for agitation and confusion. The patient is nervous/anxious.      Physical Exam Triage Vital Signs ED Triage Vitals  Enc Vitals Group     BP 05/13/19 1538 (!) 164/90     Pulse Rate 05/13/19 1538 91     Resp 05/13/19 1538 16     Temp 05/13/19 1538 99.5 F (37.5 C)     Temp Source 05/13/19 1538 Oral     SpO2 05/13/19 1538 96 %     Weight --      Height --      Head Circumference --      Peak Flow --      Pain Score 05/13/19 1540 0     Pain Loc --      Pain Edu? --      Excl. in [place]? --    No data found.  Updated Vital Signs BP (!) 164/90 (BP Location: Right Arm)   Pulse 91   Temp 99.5 F (37.5 C) (Oral)   Resp 16   SpO2 96%   Visual Acuity Right Eye Distance:   Left Eye Distance:   Bilateral Distance:    Right Eye Near:   Left Eye Near:    Bilateral Near:     Physical Exam Constitutional:      General: He is not in acute distress.    Appearance: Normal appearance. He is normal weight. He is not ill-appearing or toxic-appearing.  Cardiovascular:     Rate and Rhythm: Normal rate and regular rhythm.     Pulses: Normal pulses.     Heart sounds: Normal heart sounds. No murmur. No gallop.   Pulmonary:     Effort: Pulmonary effort is normal. No respiratory  distress.     Breath sounds: Normal breath sounds. No rhonchi.  Chest:     Chest wall: No tenderness.  Neurological:     Mental Status: He is alert and oriented to person, place, and time.  Psychiatric:  Mood and Affect: Mood is anxious.        Judgment: Judgment normal.      UC Treatments / Results  Labs (all labs ordered are listed, but only abnormal results are displayed) Labs Reviewed - No data to display  EKG   Radiology No results found.  Procedures Procedures (including critical care time)  Medications Ordered in UC Medications - No data to display  Initial Impression / Assessment and Plan / UC Course  I have reviewed the triage vital signs and the nursing notes.  Pertinent labs & imaging results that were available during my care of the patient were reviewed by me and considered in my medical decision making (see chart for details).   Patient with Hx of anxiety/ need to follow up with behavioral health and PCP. Short term hydroxyzine was prescribed Final Clinical Impressions(s) / UC Diagnoses   Final diagnoses:  Anxiety state     Discharge Instructions     Follow up with Behavioral health Rest and drink fluids Eat a well-balanced diet, and avoid excessive caffeine intake Continue with hydroxyzine nightly for symptom relief Some things you may try doing to help alleviate your symptoms include: keeping a journal, exercise, talking to a friend or relative, listening to music, going for a walk or hike outside, or other activities that you may find enjoyable PCP assistance initiated Recommending further evaluation and management with PCP Return or go to ER if you have any new or worsening symptoms such as fever, chills, fatigue, worsening shortness of breath, wheezing, chest pain, nausea, vomiting, abdominal pain, changes in bowel or bladder habits, etc...    ED Prescriptions    Medication Sig Dispense Auth. Provider   hydrOXYzine (ATARAX/VISTARIL)  25 MG tablet Take 1 tablet (25 mg total) by mouth every 6 (six) hours. 30 tablet Aaryav Hopfensperger, Darrelyn Hillock, FNP     I have reviewed the PDMP during this encounter.   Emerson Monte, FNP 05/13/19 1622    Emerson Monte, FNP 05/13/19 1624

## 2019-05-27 ENCOUNTER — Other Ambulatory Visit: Payer: Self-pay

## 2019-05-27 ENCOUNTER — Ambulatory Visit
Admission: EM | Admit: 2019-05-27 | Discharge: 2019-05-27 | Disposition: A | Discharge status: Home / Self Care | Payer: 59 | Attending: Emergency Medicine | Admitting: Emergency Medicine

## 2019-05-27 DIAGNOSIS — F411 Generalized anxiety disorder: Secondary | ICD-10-CM | POA: Diagnosis not present

## 2019-05-27 DIAGNOSIS — Z760 Encounter for issue of repeat prescription: Secondary | ICD-10-CM | POA: Diagnosis not present

## 2019-05-27 MED ORDER — HYDROXYZINE HCL 25 MG PO TABS: 25.0000 mg | ORAL_TABLET | Freq: Four times a day (QID) | ORAL | 0 refills | Status: DC | PRN

## 2019-05-27 NOTE — Discharge Instructions (Addendum)
Continue exercising.  Continue herbal tea, vitamins.  Make sure you are taking all of your medicines as prescribed to you.  You can take up to 2 tablets of the Atarax, however I would not do this on a regular basis.  Stick with the 25 mg is much as you can.

## 2019-05-27 NOTE — ED Provider Notes (Signed)
HPI  SUBJECTIVE:  William Kim is a 53 y.o. male who presents for medication refill.  He was here 2 weeks ago, prescribed Vistaril 25 mg for his anxiety.  He states that it is working very well.  He is also exercising, drinking herbal tea and taking vitamins to help control his anxiety.  He states his anxiety is worse when he has to do something that he does not like, such as paying bills, going to the doctor, etc.  He has been having anxiety/panic attacks since last year.  Patient denies any other symptoms.  He has a past medical history of atrial fibrillation.  No history of diabetes, hypertension, hyperthyroidism.  PMD: He is establishing care at [facility] primary care on December 8.  Past Medical History:  Diagnosis Date  . A-fib ([place])   . Anxiety   . Cervicalgia   . High cholesterol   . Hypertension   . OSA (obstructive sleep apnea) 05/31/2018   Severe OSA with AHI 79/h with significant oxygen desaturations as low as 58%.    Past Surgical History:  Procedure Laterality Date  . COLONOSCOPY WITH PROPOFOL N/A 08/02/2018   Procedure: COLONOSCOPY WITH PROPOFOL;  Surgeon: Daneil Dolin, MD;  Location[facility];  Service: Endoscopy;  Laterality: N/A;  12:15pm  . NERVE SURGERY    . None      Family History  Problem Relation Age of Onset  . Healthy Mother   . Healthy Father   . Colon cancer Neg Hx   . Colon polyps Neg Hx     Social History   Tobacco Use  . Smoking status: Former Research scientist (life sciences)  . Smokeless tobacco: Never Used  . Tobacco comment: quit smoking in 2012/2013, quit smoking August 16th  Substance Use Topics  . Alcohol use: Not Currently    Comment: "1-2 beers and maybe a mixed drink a day"  . Drug use: No    No current facility-administered medications for this encounter.   Current Outpatient Medications:  .  alprazolam (XANAX) 2 MG tablet, Take 1 tablet by mouth 3 (three) times daily., Disp: , Rfl:  .  Ascorbic Acid (VITAMIN C PO), Take 1 tablet by mouth  daily., Disp: , Rfl:  .  aspirin EC 81 MG tablet, Take 81 mg by mouth daily., Disp: , Rfl:  .  buPROPion (WELLBUTRIN SR) 200 MG 12 hr tablet, Take 1 tablet by mouth 2 (two) times daily., Disp: , Rfl:  .  CARTIA XT 180 MG 24 hr capsule, TAKE 1 CAPSULE EVERY MORNING, Disp: 90 capsule, Rfl: 4 .  diclofenac (VOLTAREN) 75 MG EC tablet, Take 1 tablet by mouth 3 (three) times daily., Disp: , Rfl:  .  gabapentin (NEURONTIN) 300 MG capsule, Take 1 capsule by mouth 3 (three) times daily., Disp: , Rfl:  .  hydrOXYzine (ATARAX/VISTARIL) 25 MG tablet, Take 1 tablet (25 mg total) by mouth every 6 (six) hours as needed for anxiety., Disp: 60 tablet, Rfl: 0 .  LATUDA 80 MG TABS tablet, Take 1 tablet by mouth daily., Disp: , Rfl:  .  methocarbamol (ROBAXIN) 750 MG tablet, Take 750 mg by mouth every 8 (eight) hours as needed., Disp: , Rfl:  .  mirtazapine (REMERON) 30 MG tablet, Take 1 tablet by mouth daily., Disp: , Rfl:  .  Misc Natural Products (HEALTHY LIVER PO), Take 1 capsule by mouth at bedtime., Disp: , Rfl:  .  Omega-3 Fatty Acids (FISH OIL PO), Take 1 capsule by mouth daily. ,  Disp: , Rfl:  .  oxyCODONE (ROXICODONE) 15 MG immediate release tablet, Take 2 tablets by mouth 4 (four) times daily as needed., Disp: , Rfl:  .  QUEtiapine (SEROQUEL) 300 MG tablet, Take 1 tablet by mouth daily., Disp: , Rfl:  .  sildenafil (VIAGRA) 100 MG tablet, Take 1 tablet by mouth daily as needed., Disp: , Rfl:  .  simvastatin (ZOCOR) 20 MG tablet, Take 20 mg by mouth daily., Disp: , Rfl:  .  valACYclovir (VALTREX) 500 MG tablet, Take 1 tablet by mouth daily., Disp: , Rfl:   No Known Allergies   ROS  As noted in HPI.   Physical Exam  BP 126/83   Pulse 77   Temp 98.8 F (37.1 C)   Resp 18   SpO2 96%   Constitutional: Well developed, well nourished, no acute distress Eyes:  EOMI, conjunctiva normal bilaterally HENT: Normocephalic, atraumatic,mucus membranes moist Respiratory: Normal inspiratory  effort Cardiovascular: Normal rate GI: nondistended skin: No rash, skin intact Musculoskeletal: no deformities Neurologic: Alert & oriented x 3, no focal neuro deficits Psychiatric: Speech and behavior appropriate.  Pleasant, logical thinking.  No anxiety.  No apparent suicidal, homicidal, auditory or visual hallucinations.   ED Course   Medications - No data to display  No orders of the defined types were placed in this encounter.   No results found for this or any previous visit (from the past 24 hour(s)). No results found.  ED Clinical Impression  1. Anxiety state   2. Medication refill      ED Assessment/Plan  Patient states he will be establishing care with his PMD in 2 weeks, on December 8.  He appears well, no anxiety today.  He has no complaints.  will refill his Atarax 25 mg every 6 hours, however will give him a longer supply in case something happens to his December 8 appointment.  He also needs to follow-up with behavioral health as he was advised to do on his last appointment.  Continue herbal tea, vitamins, exercise.  Discussed MDM, treatment plan, and plan for follow-up with patient. Discussed sn/sx that should prompt return to the ED. patient agrees with plan.   Meds ordered this encounter  Medications  . hydrOXYzine (ATARAX/VISTARIL) 25 MG tablet    Sig: Take 1 tablet (25 mg total) by mouth every 6 (six) hours as needed for anxiety.    Dispense:  60 tablet    Refill:  0    *This clinic note was created using [employer]. Therefore, there may be occasional mistakes despite careful proofreading.   ?    Melynda Ripple, MD 05/27/19 [phone]

## 2019-05-27 NOTE — ED Triage Notes (Signed)
Pt here for medication refill for atarax, pt has 2 left, unable to be seen by primary until December

## 2019-05-31 ENCOUNTER — Encounter: Payer: Self-pay | Admitting: Family Medicine

## 2019-05-31 ENCOUNTER — Other Ambulatory Visit: Payer: Self-pay

## 2019-05-31 ENCOUNTER — Ambulatory Visit (INDEPENDENT_AMBULATORY_CARE_PROVIDER_SITE_OTHER): Payer: 59 | Admitting: Family Medicine

## 2019-05-31 VITALS — BP 140/95 | HR 88 | Temp 98.6°F | Resp 15 | Ht 72.0 in | Wt 190.0 lb

## 2019-05-31 DIAGNOSIS — E663 Overweight: Secondary | ICD-10-CM

## 2019-05-31 DIAGNOSIS — I10 Essential (primary) hypertension: Secondary | ICD-10-CM

## 2019-05-31 DIAGNOSIS — F32A Depression, unspecified: Secondary | ICD-10-CM

## 2019-05-31 DIAGNOSIS — F329 Major depressive disorder, single episode, unspecified: Secondary | ICD-10-CM

## 2019-05-31 DIAGNOSIS — F419 Anxiety disorder, unspecified: Secondary | ICD-10-CM

## 2019-05-31 DIAGNOSIS — G479 Sleep disorder, unspecified: Secondary | ICD-10-CM

## 2019-05-31 DIAGNOSIS — F4310 Post-traumatic stress disorder, unspecified: Secondary | ICD-10-CM | POA: Insufficient documentation

## 2019-05-31 DIAGNOSIS — I480 Paroxysmal atrial fibrillation: Secondary | ICD-10-CM | POA: Diagnosis not present

## 2019-05-31 DIAGNOSIS — I4891 Unspecified atrial fibrillation: Secondary | ICD-10-CM | POA: Insufficient documentation

## 2019-05-31 MED ORDER — BUPROPION HCL ER (XL) 150 MG PO TB24: 150.0000 mg | ORAL_TABLET | Freq: Every day | ORAL | 2 refills | Status: DC

## 2019-05-31 NOTE — Patient Instructions (Addendum)
I appreciate the opportunity to provide you with care for your health and wellness. Today we discussed: overall health   Follow up: 1 months by phone   No labs   Referral made to therapy today.  Please sign the release form for [facility] Wellbutrin, take daily.  Follow Dash diet: samples and food list provided today  And reset sleep routine: see below for list.  GOALS: Start writing a journal to identity triggers of anxiety  Start walking 30 minutes daily, lift weights 3-4 days a week for 20 minutes.   Increase water intake during the day  I hope you have a wonderful, happy, safe, and healthy Holiday Season! See you in the New Year :)  Please continue to practice social distancing to keep you, your family, and our community safe.  If you must go out, please wear a mask and practice good handwashing.  It was a pleasure to see you and I look forward to continuing to work together on your health and well-being. Please do not hesitate to call the office if you need care or have questions about your care.  Have a wonderful day and week. With Gratitude, Cherly Beach, DNP, AGNP-BC   Reset Sleep with Sleep Assessment Check List:  Teens need about 9 hours of sleep a night. Younger children need more sleep (10-11 hours a night) and adults need slightly less (7-9 hours each night). 11 Tips to Follow: 1. No caffeine after 3pm: Avoid beverages with caffeine (soda, tea, energy drinks, etc.) especially after 3pm.  2. Don't go to bed hungry: Have your evening meal at least 3 hrs. before going to sleep. It's fine to have a small bedtime snack such as a glass of milk and a few crackers but don't have a big meal.  3. Have a nightly routine before bed: Plan on "winding down" before you go to sleep. Begin relaxing about 1 hour before you go to bed. Try doing a quiet activity such as listening to calming music, reading a book or meditating.  4. Turn off the TV and ALL electronics  including video games, tablets, laptops, etc. 1 hour before sleep, and keep them out of the bedroom.  5. Turn off your cell phone and all notifications (new email and text alerts) or even better, leave your phone outside your room while you sleep. Studies have shown that a part of your brain continues to respond to certain lights and sounds even while you're still asleep.  6. Make your bedroom quiet, dark and cool. If you can't control the noise, try wearing earplugs or using a fan to block out other sounds.  7. Practice relaxation techniques. Try reading a book or meditating or drain your brain by writing a list of what you need to do the next day.  8. Don't nap unless you feel sick: you'll have a better night's sleep.  9. Don't smoke, or quit if you do. Nicotine, alcohol, and marijuana can all keep you awake. Talk to your health care provider if you need help with substance use.  10. Most importantly, wake up at the same time every day (or within 1 hour of your usual wake up time) EVEN on the weekends. A regular wake up time promotes sleep hygiene and prevents sleep problems.  11. Reduce exposure to bright light in the last three hours of the day before going to sleep.  Maintaining good sleep hygiene and having good sleep habits lower your risk of developing  sleep problems. Getting better sleep can also improve your concentration and alertness. Try the simple steps in this guide. If you still have trouble getting enough rest, make an appointment with your health care provider.

## 2019-05-31 NOTE — Progress Notes (Signed)
Subjective:     Patient ID: William Kim, male   DOB: 10/27/1965, 53 y.o.   MRN: [id number]  William Kim presents for New Patient (Initial Visit) (establish care) William Kim is a 53 year old male patient who has a history of atrial fibrillation, hypertension, obstructive sleep apnea uses CPAP, obesity which is now overweight, PTSD, anxiety and depression, sleep disturbance, anxiety, hyper lipidemia among others.  Reports today to establish care from previous doctor's office which was [place].  Reports that he does not know what he was getting when he needed there.  So he presented here today.  Wife is also a patient of William Kim at the same clinic.  Concerns today include him having ongoing headache, anxiety recent trip to the urgent care resulted in him getting put on hydroxyzine.  Reports that he felt like the walls were closing in and that he was dying has multiple references to the fact that he thinks he is dying or something is wrong or something is happening to him.  He has a new grandbaby to 29-month follow-up but he reports he needs to live for.  He understands that this is probably all anxiety but for some reason he is just having a hard time dealing with it.  Multiple medications on his list he reports he is not taking that include Wellbutrin, Remeron, Seroquel, Latuda among others.  Reports that he has been diagnosed with PTSD in the past.  Was in the service as well as his wife's 2.   Is followed by cardiology for obstructive sleep apnea and at the atrial fibrillation clinic.  Reports taking his medications as directed.  Reports he has lost some weight since anxiety has become uncontrolled.  He is unsure why has become uncontrolled the last several months.  Former smoker and alcohol user.  Quit cold [place] 3 to 4 months ago.  Prior to having a surgery on his neck.  Has been out of work during this time.  Which might contribute to the anxiety he is feeling.  Today patient  denies signs and symptoms of COVID 19 infection including fever, chills, cough, shortness of breath, and headache.  Past Medical, Surgical, Social History, Allergies, and Medications have been Reviewed.  Past Medical History:  Diagnosis Date  . A-fib ([place])   . Anxiety   . Cervicalgia   . High cholesterol   . Hypertension   . OSA (obstructive sleep apnea) 05/31/2018   Severe OSA with AHI 79/h with significant oxygen desaturations as low as 58%.   Past Surgical History:  Procedure Laterality Date  . COLONOSCOPY WITH PROPOFOL N/A 08/02/2018   Procedure: COLONOSCOPY WITH PROPOFOL;  Surgeon: Daneil Dolin, MD;  [facility];  Service: Endoscopy;  Laterality: N/A;  12:15pm  . NERVE SURGERY    . None     Social History   Socioeconomic History  . Marital status: Married    Spouse name: Not on file  . Number of children: Not on file  . Years of education: Not on file  . Highest education level: Not on file  Occupational History  . Not on file  Social Needs  . Financial resource strain: Not on file  . Food insecurity    Worry: Not on file    Inability: Not on file  . Transportation needs    Medical: Not on file    Non-medical: Not on file  Tobacco Use  . Smoking status: Former Smoker    Types:  Cigarettes  . Smokeless tobacco: Never Used  . Tobacco comment: quit smoking in 2012/2013, quit smoking August 16th  Substance and Sexual Activity  . Alcohol use: Not Currently    Comment: stopped 3-4 months ago  . Drug use: No  . Sexual activity: Not on file  Lifestyle  . Physical activity    Days per week: Not on file    Minutes per session: Not on file  . Stress: Not on file  Relationships  . Social Herbalist on phone: Not on file    Gets together: Not on file    Attends religious service: Not on file    Active member of club or organization: Not on file    Attends meetings of clubs or organizations: Not on file    Relationship status: Not on file  .  Intimate partner violence    Fear of current or ex partner: Not on file    Emotionally abused: Not on file    Physically abused: Not on file    Forced sexual activity: Not on file  Other Topics Concern  . Not on file  Social History Narrative  . Not on file    Outpatient Encounter Medications as of 05/31/2019  Medication Sig  . Ascorbic Acid (VITAMIN C PO) Take 1 tablet by mouth daily.  Marland Kitchen aspirin EC 81 MG tablet Take 81 mg by mouth daily.  Marland Kitchen CARTIA XT 180 MG 24 hr capsule TAKE 1 CAPSULE EVERY MORNING  . diltiazem (CARDIZEM CD) 120 MG 24 hr capsule Take 120 mg by mouth at bedtime. Takes 180mg  in the morning  . hydrOXYzine (ATARAX/VISTARIL) 25 MG tablet Take 1 tablet (25 mg total) by mouth every 6 (six) hours as needed for anxiety.  . sildenafil (VIAGRA) 100 MG tablet Take 1 tablet by mouth daily as needed.  . simvastatin (ZOCOR) 10 MG tablet Take 10 mg by mouth daily.  . valACYclovir (VALTREX) 500 MG tablet Take 1 tablet by mouth daily.  . [DISCONTINUED] alprazolam (XANAX) 2 MG tablet Take 1 tablet by mouth 3 (three) times daily.  . [DISCONTINUED] buPROPion (WELLBUTRIN SR) 200 MG 12 hr tablet Take 1 tablet by mouth 2 (two) times daily.  . [DISCONTINUED] diclofenac (VOLTAREN) 75 MG EC tablet Take 1 tablet by mouth 3 (three) times daily.  . [DISCONTINUED] gabapentin (NEURONTIN) 300 MG capsule Take 1 capsule by mouth 3 (three) times daily.  . [DISCONTINUED] LATUDA 80 MG TABS tablet Take 1 tablet by mouth daily.  . [DISCONTINUED] methocarbamol (ROBAXIN) 750 MG tablet Take 750 mg by mouth every 8 (eight) hours as needed.  . [DISCONTINUED] mirtazapine (REMERON) 30 MG tablet Take 1 tablet by mouth daily.  . [DISCONTINUED] Misc Natural Products (HEALTHY LIVER PO) Take 1 capsule by mouth at bedtime.  . [DISCONTINUED] Omega-3 Fatty Acids (FISH OIL PO) Take 1 capsule by mouth daily.   . [DISCONTINUED] oxyCODONE (ROXICODONE) 15 MG immediate release tablet Take 2 tablets by mouth 4 (four) times daily  as needed.  . [DISCONTINUED] QUEtiapine (SEROQUEL) 300 MG tablet Take 1 tablet by mouth daily.  . [DISCONTINUED] simvastatin (ZOCOR) 20 MG tablet Take 20 mg by mouth daily.   No facility-administered encounter medications on file as of 05/31/2019.    No Known Allergies  Review of Systems  Constitutional: Negative for chills and fever.  HENT: Negative.   Eyes: Negative.   Respiratory: Negative.   Cardiovascular: Negative.   Gastrointestinal: Negative.   Endocrine: Negative.   Genitourinary: Negative.  Musculoskeletal: Negative.   Skin: Negative.   Allergic/Immunologic: Negative.   Neurological: Negative.   Hematological: Negative.   Psychiatric/Behavioral: Positive for sleep disturbance. The patient is nervous/anxious and is hyperactive.   All other systems reviewed and are negative.      Objective:     BP (!) 140/95   Pulse 88   Temp 98.6 F (37 C) (Oral)   Resp 15   Ht 6' (1.829 m)   Wt 190 lb 0.6 oz (86.2 kg)   SpO2 96%   BMI 25.77 kg/m   Physical Exam Vitals signs and nursing note reviewed.  Constitutional:      Appearance: Normal appearance. He is well-developed, well-groomed and overweight.  HENT:     Head: Normocephalic and atraumatic.     Right Ear: External ear normal.     Left Ear: External ear normal.     Nose: Nose normal.     Mouth/Throat:     Mouth: Mucous membranes are moist.     Pharynx: Oropharynx is clear.  Eyes:     General:        Right eye: No discharge.        Left eye: No discharge.     Conjunctiva/sclera: Conjunctivae normal.  Neck:     Musculoskeletal: Normal range of motion and neck supple.  Cardiovascular:     Rate and Rhythm: Normal rate and regular rhythm.     Pulses: Normal pulses.     Heart sounds: Normal heart sounds.  Pulmonary:     Effort: Pulmonary effort is normal.     Breath sounds: Normal breath sounds.  Musculoskeletal: Normal range of motion.  Skin:    General: Skin is warm.  Neurological:     General: No  focal deficit present.     Mental Status: He is alert and oriented to person, place, and time.  Psychiatric:        Attention and Perception: Attention normal.        Mood and Affect: Mood is anxious.        Speech: Speech normal.        Behavior: Behavior is hyperactive. Behavior is cooperative.        Thought Content: Thought content normal.        Cognition and Memory: Cognition normal.        Judgment: Judgment is impulsive.     Comments: Repeatative in conversation-about concerns surrounding anxiety        Assessment and Plan       1. Paroxysmal atrial fibrillation (HCC) Is followed by Dr. Olivia Mackie Turner-cardiology.  And the atrial fibrillation clinic Dr. Gerarda Fraction. Does not look like he has been seeing at the A. fib clinic since November 2018. Encouraged to make sure that he stays followed up with them.  As they are the ones who put him on the Cardizem that he takes twice a day.  He is sinus rhythm on exam today.  Reports taking his medications as directed and without issue. Appreciate collaboration of care.  2. Anxiety and depression Significant anxiety and some depression noted today.  Possible PTSD as he has been reported to have in the past.  Was in the service.  Has multiple medications that were on his medication list including Seroquel, Latuda and Remeron he reports that he never took.  In addition to Wellbutrin that he never took.  Lots of education provided today on the need for management of anxiety.  Repetitive in conversation about concerns surrounding anxiety.  Feels like something is wrong with him.  Recently went to the urgent care and was started on hydroxyzine he reports that that does help some.  He is okay with getting referral to behavioral health for therapy.  Does not really want to be on a lot of medications but understands that he might need medications.  Is willing to start Wellbutrin at this time.  Close follow-up in 1 month. Provided him with some goals of  journaling, increasing water, and exercise regimen.  - buPROPion (WELLBUTRIN XL) 150 MG 24 hr tablet; Take 1 tablet (150 mg total) by mouth daily.  Dispense: 30 tablet; Refill: 2 - Ambulatory referral to Behavioral Health  3. PTSD (post-traumatic stress disorder) Referral has been placed to [facility]  I appreciate collaboration in patient's plan of care. Please let PCP know if assistance from [place] is needed.  - Ambulatory referral to [facility]  4. Benign essential HTN Bp is elevated, but he is anxious in office. Reports taking only his Afib medication and ASA. Reports no other BP medications. Is on a statin.  Discussed that he might need to be on another blood pressure agent.  At this time I will give him 3 months as he reports that he has been trying to eat better and do more lifestyle changes. Provided with DASH diet information and food list in the office.  5. Overweight (BMI 25.0-29.9) Appears to have lost weight as he was listed as being over 30 BMI in the past.  Possibly related to anxiety being uncontrolled. Encourage well-balanced diet provided with DASH diet  6. Sleep disturbance Provided with sleep checklist to help him get a sleep reset.  Advised that as long as his anxiety is not controlled he probably will not have good sleep.  He verbalized understanding this.  And will review the information provided to him today.   Follow up: 1 month   Perlie Mayo, DNP, AGNP-BC [address], [address], [place] [address] Office Hours: Mon-Thurs 8 am-5 pm; Fri 8 am-12 pm Office Phone:  [phone]  Office Fax: [phone]

## 2019-06-06 ENCOUNTER — Other Ambulatory Visit (HOSPITAL_COMMUNITY): Payer: Self-pay | Admitting: *Deleted

## 2019-06-06 ENCOUNTER — Other Ambulatory Visit (HOSPITAL_COMMUNITY): Payer: Self-pay

## 2019-06-06 MED ORDER — DILTIAZEM HCL ER COATED BEADS 120 MG PO CP24: 120.0000 mg | ORAL_CAPSULE | Freq: Every day | ORAL | 0 refills | Status: DC

## 2019-06-07 ENCOUNTER — Ambulatory Visit: Payer: 59 | Admitting: Family Medicine

## 2019-06-13 ENCOUNTER — Ambulatory Visit (HOSPITAL_COMMUNITY)
Admission: RE | Admit: 2019-06-13 | Discharge: 2019-06-13 | Disposition: A | Discharge status: Home / Self Care | Payer: 59 | Source: Ambulatory Visit | Attending: Nurse Practitioner | Admitting: Nurse Practitioner

## 2019-06-13 ENCOUNTER — Encounter (HOSPITAL_COMMUNITY): Payer: Self-pay | Admitting: Nurse Practitioner

## 2019-06-13 ENCOUNTER — Other Ambulatory Visit: Payer: Self-pay

## 2019-06-13 VITALS — BP 146/84 | HR 96 | Ht 72.0 in | Wt 189.6 lb

## 2019-06-13 DIAGNOSIS — Z7982 Long term (current) use of aspirin: Secondary | ICD-10-CM | POA: Insufficient documentation

## 2019-06-13 DIAGNOSIS — Z79899 Other long term (current) drug therapy: Secondary | ICD-10-CM | POA: Diagnosis not present

## 2019-06-13 DIAGNOSIS — F419 Anxiety disorder, unspecified: Secondary | ICD-10-CM | POA: Insufficient documentation

## 2019-06-13 DIAGNOSIS — G4733 Obstructive sleep apnea (adult) (pediatric): Secondary | ICD-10-CM | POA: Diagnosis not present

## 2019-06-13 DIAGNOSIS — E7800 Pure hypercholesterolemia, unspecified: Secondary | ICD-10-CM | POA: Diagnosis not present

## 2019-06-13 DIAGNOSIS — Z7901 Long term (current) use of anticoagulants: Secondary | ICD-10-CM | POA: Diagnosis not present

## 2019-06-13 DIAGNOSIS — I480 Paroxysmal atrial fibrillation: Secondary | ICD-10-CM

## 2019-06-13 DIAGNOSIS — Z87891 Personal history of nicotine dependence: Secondary | ICD-10-CM | POA: Diagnosis not present

## 2019-06-13 DIAGNOSIS — I10 Essential (primary) hypertension: Secondary | ICD-10-CM | POA: Insufficient documentation

## 2019-06-13 DIAGNOSIS — F1010 Alcohol abuse, uncomplicated: Secondary | ICD-10-CM | POA: Insufficient documentation

## 2019-06-13 MED ORDER — DILTIAZEM HCL ER COATED BEADS 120 MG PO CP24: 120.0000 mg | ORAL_CAPSULE | Freq: Every day | ORAL | 3 refills | Status: DC

## 2019-06-13 MED ORDER — DILTIAZEM HCL ER COATED BEADS 180 MG PO CP24: 180.0000 mg | ORAL_CAPSULE | Freq: Every morning | ORAL | 3 refills | Status: DC

## 2019-06-13 NOTE — Progress Notes (Addendum)
Primary Care Physician: Perlie Mayo, NP Referring Physician: [facility] ER f/u   SHANTEL WIEDMAIER is a 53 y.o. male with a h/o HTN, alcohol use and untreated probable sleep apnea. It is documented in Epic in 2014 that he was treated for afib in the ER. The pt reports that he would have issues with irregular heart beat off and on since then but episodes would be short lived. He noticed the episodes were more frequent over the last few weeks and recently present to the ER, 7/31, with afib with RVR.He was given IV diltiazem and converted. Discharged home on xarelto 20 mg for a chadsvasc score of 1 and daily diltiazem 120 mg a day.  In the afib clinic, 8/3, He is in [place]. No further afib. He was drinking 6 alcoholic drinks a night and was told in the ER to diminish alcohol intake. He also reports significant snoring/ apnea and about 8 years ago, had a sleep study but when the cpap mask was applied, he did not like the feeling and left the test. 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 He denies tobacco or caffeine use.  F/u in afib clinic 8/27. He is in [place]. Has noted a few short lived episodes of irregular heart beat. He will finish xarelto for a chadsvasc score of 1(htn). Echo showed Mild LVH. He has tolerated addition of diltiazem daily. Sleep study is pending. He has cut back on alcohol but has not had cessation.  F/u afib clinic, he had repeated sleep study and is pending results of test. Still  notices some episodes of afib lasting around 15-20 mins. Still has cut down on alcohol but two nights ago had 2 beers and a margarita. He is exercising more and has lost 4 lbs. He is now off anticoagulation.  F/u in afib clinic, 05/15/17. He continues with alcohol abuse, drinking a beer and a margarita every night. He has been found to have sleep apnea and is pending getting his CPAP. He notices some irregular heart rate intermittently but it sounds more consistent with premature contractions.   F/u in afib  clinic, 06/13/19. Was referred back from PCP to refill diltiazem. I have not seen since 2018.  Pt has been struggling for several months of anxiety/panic attacks and has been to the ER 2x in November with feeling of heart racing, impending doom.  He states that he had shoulder surgery in June and has had more time at the house, being on short term disability, watching TV, with the constant issues with covid reporting, contributing to anxiety. He is not due to go back to work until after 1//21. When he has his attacks he is aware of impending doom, heart racing. No ekg's in the ER showed afib. I do not see any evidence of afib since I last saw pt. He has also lost from 230 to 180 lb, which he contributes to anxiety decreasing his appetite. He saw his PCP recently and was referred to behavioral health. He has not heard back re appointment date yet. He was given hydroxyzine 25 mg  to use when necessary.   Today, he denies symptoms of palpitations, chest pain, shortness of breath, orthopnea, PND, lower extremity edema, dizziness, presyncope, syncope, or neurologic sequela. The patient is tolerating medications without difficulties and is otherwise without complaint today.   Past Medical History:  Diagnosis Date  . A-fib ([place])   . Anxiety   . Cervicalgia   . High cholesterol   .  Hypertension   . OSA (obstructive sleep apnea) 05/31/2018   Severe OSA with AHI 79/h with significant oxygen desaturations as low as 58%.   Past Surgical History:  Procedure Laterality Date  . COLONOSCOPY WITH PROPOFOL N/A 08/02/2018   Procedure: COLONOSCOPY WITH PROPOFOL;  Surgeon: Daneil Dolin, MD;  [facility];  Service: Endoscopy;  Laterality: N/A;  12:15pm  . NERVE SURGERY    . None      Current Outpatient Medications  Medication Sig Dispense Refill  . Ascorbic Acid (VITAMIN C PO) Take 1 tablet by mouth daily.    Marland Kitchen aspirin EC 81 MG tablet Take 81 mg by mouth daily.    Marland Kitchen CARTIA XT 180 MG 24 hr capsule  TAKE 1 CAPSULE EVERY MORNING 90 capsule 4  . diltiazem (CARDIZEM CD) 120 MG 24 hr capsule Take 1 capsule (120 mg total) by mouth at bedtime. Takes 180mg  in the morning 90 capsule 0  . hydrOXYzine (ATARAX/VISTARIL) 25 MG tablet Take 1 tablet (25 mg total) by mouth every 6 (six) hours as needed for anxiety. 60 tablet 0  . sildenafil (VIAGRA) 100 MG tablet Take 1 tablet by mouth daily as needed.    . simvastatin (ZOCOR) 10 MG tablet Take 10 mg by mouth daily.    . valACYclovir (VALTREX) 500 MG tablet Take 1 tablet by mouth daily.     No current facility-administered medications for this encounter.    No Known Allergies  Social History   Socioeconomic History  . Marital status: Married    Spouse name: Archie Patten   . Number of children: 2  . Years of education: Not on file  . Highest education level: 12th grade  Occupational History  . Not on file  Tobacco Use  . Smoking status: Former Smoker    Types: Cigarettes  . Smokeless tobacco: Never Used  . Tobacco comment: quit smoking in 2012/2013, quit smoking August 16th  Substance and Sexual Activity  . Alcohol use: Not Currently    Comment: stopped 3-4 months ago  . Drug use: No  . Sexual activity: Not on file  Other Topics Concern  . Not on file  Social History Narrative   Lives with wife Archie Patten married 3/10 63 years   2 children   Son 94 lives in [place], 1 grandbaby 26 months old 2020   Daughter 56 she is living at home      Enjoys fishing      Diet: eats veggies and salmon, avoid red meats   Caffeine: does not drink    Water: 2 bottles day       Wear seat belt    Smoke detectors   Does not use phone while driving    Social Determinants of Health   Financial Resource Strain: Low Risk   . Difficulty of Paying Living Expenses: Not hard at all  Food Insecurity: No Food Insecurity  . Worried About Charity fundraiser in the Last Year: Never true  . Ran Out of Food in the Last Year: Never true  Transportation Needs: No  Transportation Needs  . Lack of Transportation (Medical): No  . Lack of Transportation (Non-Medical): No  Physical Activity: Insufficiently Active  . Days of Exercise per Week: 5 days  . Minutes of Exercise per Session: 20 min  Stress: Stress Concern Present  . Feeling of Stress : To some extent  Social Connections: Somewhat Isolated  . Frequency of Communication with Friends and Family: Three times a week  .  Frequency of Social Gatherings with Friends and Family: Three times a week  . Attends Religious Services: Never  . Active Member of Clubs or Organizations: No  . Attends Archivist Meetings: Never  . Marital Status: Married  Human resources officer Violence: Not At Risk  . Fear of Current or Ex-Partner: No  . Emotionally Abused: No  . Physically Abused: No  . Sexually Abused: No    Family History  Problem Relation Age of Onset  . Healthy Mother   . Healthy Father   . Colon cancer Neg Hx   . Colon polyps Neg Hx     ROS- All systems are reviewed and negative except as per the HPI above  Physical Exam: Vitals:   06/13/19 0908  BP: (!) 146/84  Pulse: 96  Weight: 86 kg  Height: 6' (1.829 m)   Wt Readings from Last 3 Encounters:  06/13/19 86 kg  05/31/19 86.2 kg  04/08/19 93 kg    Labs: Lab Results  Component Value Date   NA 143 04/08/2019   K 3.2 (L) 04/08/2019   CL 109 04/08/2019   CO2 23 04/08/2019   GLUCOSE 102 (H) 04/08/2019   BUN 8 04/08/2019   CREATININE 0.82 04/08/2019   CALCIUM 9.8 04/08/2019   No results found for: INR No results found for: CHOL, HDL, LDLCALC, TRIG   GEN- The patient is well appearing, alert and oriented x 3 today.   Head- normocephalic, atraumatic Eyes-  Sclera clear, conjunctiva pink Ears- hearing intact Oropharynx- clear Neck- supple, no JVP Lymph- no cervical lymphadenopathy Lungs- Clear to ausculation bilaterally, normal work of breathing Heart- Regular rate and rhythm, no murmurs, rubs or gallops, PMI not  laterally displaced GI- soft, NT, ND, + BS Extremities- no clubbing, cyanosis, or edema MS- no significant deformity or atrophy Skin- no rash or lesion Psych- euthymic mood, full affect Neuro- strength and sensation are intact  EKG- NSR at 96  Bpm,  pr int 164 ms, qrs in 64ms, qtc 406  ms Epic records reviewed Echo- 2018-Study Conclusions  - Left ventricle: The cavity size was normal. Wall thickness was   increased in a pattern of mild LVH. Systolic function was normal.   The estimated ejection fraction was in the range of 60% to 65%.   Low normal GLPSS at -18%. Wall motion was normal; there were no   regional wall motion abnormalities. Doppler parameters are   consistent with abnormal left ventricular relaxation (grade 1   diastolic dysfunction). The E/e&' ratio is between 8-15,   suggesting indeterminate LV filling pressure. - Mitral valve: Mildly thickened leaflets . There was trivial   regurgitation. - Left atrium: The atrium was normal in size. - Inferior vena cava: The vessel was normal in size. The   respirophasic diameter changes were in the normal range (>= 50%),   consistent with normal central venous pressure.  Impressions:  - LVEF 60-65%, mild LVH, normal wall thickness, normal wall motion,   grade 1 DD, indeterminate LV filling pressure, normal LA Size,   normal IVC.   Assessment and Plan: 1. Paroxysmal afib I have seen no evidence of afib since 2018 He only feels heart racing with anxiety attacks  General education re afib Continue diltiazem 180 mg am and 120 mg pm   Off xarelto, with a chadsvasc score of 1, continue with  81 mg ASA  2. HTN Stable Elevated with panic attacks    3. Lifestyle measures  No alcohol use  He  is trying to eat better at this time, less salt, healthy choices Encouraged  to maintain current weight  Regular exercise recommended   4. Anxiety Pt has been struggling with this Reassured He is pending Behavioral Med visit     Will see back in one year  Butch Penny C. Kinsley Nicklaus, [facility] [address] [place], [place] [address] [phone]

## 2019-06-13 NOTE — Addendum Note (Signed)
Encounter addended by: Sherran Needs, NP on: 06/13/2019 10:28 AM  Actions taken: Clinical Note Signed

## 2019-06-13 NOTE — Addendum Note (Signed)
Encounter addended by: Sherran Needs, NP on: 06/13/2019 10:27 AM  Actions taken: Clinical Note Signed

## 2019-06-14 ENCOUNTER — Telehealth: Payer: Self-pay | Admitting: *Deleted

## 2019-06-14 NOTE — Telephone Encounter (Signed)
Pt said he feels like something is in his eye and something is scratching wanted to know if [facility] would send him to ophthalmology

## 2019-06-14 NOTE — Telephone Encounter (Signed)
FYI

## 2019-06-14 NOTE — Telephone Encounter (Signed)
Pt said cancel the referral he has found an eye dr and he is going today

## 2019-06-21 ENCOUNTER — Telehealth: Payer: Self-pay | Admitting: *Deleted

## 2019-06-21 ENCOUNTER — Other Ambulatory Visit: Payer: Self-pay | Admitting: Family Medicine

## 2019-06-21 DIAGNOSIS — F419 Anxiety disorder, unspecified: Secondary | ICD-10-CM

## 2019-06-21 DIAGNOSIS — F329 Major depressive disorder, single episode, unspecified: Secondary | ICD-10-CM

## 2019-06-21 MED ORDER — HYDROXYZINE HCL 25 MG PO TABS: 25.0000 mg | ORAL_TABLET | Freq: Three times a day (TID) | ORAL | 0 refills | Status: DC | PRN

## 2019-06-21 NOTE — Telephone Encounter (Signed)
This medication has not been filled by you. Do you want to refill it?

## 2019-06-21 NOTE — Telephone Encounter (Signed)
Pt called needing a refill on his hydroxyzine sent to [facility] he is out of the medication

## 2019-06-30 ENCOUNTER — Encounter: Payer: Self-pay | Admitting: Family Medicine

## 2019-06-30 ENCOUNTER — Ambulatory Visit (INDEPENDENT_AMBULATORY_CARE_PROVIDER_SITE_OTHER): Payer: 59 | Admitting: Family Medicine

## 2019-06-30 ENCOUNTER — Other Ambulatory Visit: Payer: Self-pay

## 2019-06-30 DIAGNOSIS — F329 Major depressive disorder, single episode, unspecified: Secondary | ICD-10-CM

## 2019-06-30 DIAGNOSIS — F419 Anxiety disorder, unspecified: Secondary | ICD-10-CM | POA: Diagnosis not present

## 2019-06-30 NOTE — Patient Instructions (Addendum)
Happy New Year I appreciate the opportunity to provide you with care for your health and wellness. Today we discussed: anxiety and depression  Follow up: 6 months for annual,vision check  No labs or referrals today  I am happy you are starting to feel better. Continue to eat a well balanced diet and drink plenty of water.  Please continue to practice social distancing to keep you, your family, and our community safe.  If you must go out, please wear a mask and practice good handwashing.  It was a pleasure to see you and I look forward to continuing to work together on your health and well-being. Please do not hesitate to call the office if you need care or have questions about your care.  Have a wonderful day and week. With Gratitude, Cherly Beach, DNP, AGNP-BC

## 2019-06-30 NOTE — Progress Notes (Signed)
Virtual Visit via Telephone Note   This visit type was conducted due to national recommendations for restrictions regarding the COVID-19 Pandemic (e.g. social distancing) in an effort to limit this patient's exposure and mitigate transmission in our community.  Due to his co-morbid illnesses, this patient is at least at moderate risk for complications without adequate follow up.  This format is felt to be most appropriate for this patient at this time.  The patient did not have access to video technology/had technical difficulties with video requiring transitioning to audio format only (telephone).  All issues noted in this document were discussed and addressed.  No physical exam could be performed with this format.    Evaluation Performed:  Follow-up visit  Date:  06/30/2019   ID:  William Kim, DOB 1965-12-06, MRN [id number]  Patient Location: Home Provider Location: Office  Location of Patient: Home Location of Provider: Telehealth Consent was obtain for visit to be over via telehealth. I verified that I am speaking with the correct person using two identifiers.  PCP:  Perlie Mayo, NP   Chief Complaint:  Anxiety and depression  History of Present Illness:    William Kim is a 53 y.o. male with history of anxiety, a-fib, hyperlipidemia, OSA and others. Presents for follow up today for anxiety and depression. Reports not starting the Wellbutrin dur to the side effects he read about it. Reports he feels he is doing okay on the Atarax as needed and a as needed Vitamin Stress Pill from [employer]. He also uses a stress reducing tea. He also wanted to mention he has some pain in prostate that he has had before and wants to get labs for it. He also started super-beta prostate pill, he found at [facility] as well. Reports it has helped a lot.   The patient does not have symptoms concerning for COVID-19 infection (fever, chills, cough, or new shortness of breath).   Past Medical,  Surgical, Social History, Allergies, and Medications have been Reviewed.  Past Medical History:  Diagnosis Date  . A-fib ([place])   . Anxiety   . Cervicalgia   . High cholesterol   . Hypertension   . OSA (obstructive sleep apnea) 05/31/2018   Severe OSA with AHI 79/h with significant oxygen desaturations as low as 58%.   Past Surgical History:  Procedure Laterality Date  . COLONOSCOPY WITH PROPOFOL N/A 08/02/2018   Procedure: COLONOSCOPY WITH PROPOFOL;  Surgeon: Daneil Dolin, MD;  [facility];  Service: Endoscopy;  Laterality: N/A;  12:15pm  . NERVE SURGERY    . None       Current Meds  Medication Sig  . Ascorbic Acid (VITAMIN C PO) Take 1 tablet by mouth daily.  Marland Kitchen aspirin EC 81 MG tablet Take 81 mg by mouth daily.  Marland Kitchen diltiazem (CARDIZEM CD) 120 MG 24 hr capsule Take 1 capsule (120 mg total) by mouth at bedtime. Takes 180mg  in the morning  . diltiazem (CARTIA XT) 180 MG 24 hr capsule Take 1 capsule (180 mg total) by mouth every morning.  . hydrOXYzine (ATARAX/VISTARIL) 25 MG tablet Take 1 tablet (25 mg total) by mouth every 8 (eight) hours as needed for anxiety.  . sildenafil (VIAGRA) 100 MG tablet Take 1 tablet by mouth daily as needed.  . simvastatin (ZOCOR) 10 MG tablet Take 10 mg by mouth daily.  . valACYclovir (VALTREX) 500 MG tablet Take 1 tablet by mouth daily.     Allergies:  Patient has no known allergies.   ROS:   Please see the history of present illness.    All other systems reviewed and are negative.   Labs/Other Tests and Data Reviewed:    Recent Labs: 02/24/2019: ALT 45; TSH 0.936 04/08/2019: BUN 8; Creatinine, Ser 0.82; Hemoglobin 13.9; Platelets 258; Potassium 3.2; Sodium 143   Recent Lipid Panel No results found for: CHOL, TRIG, HDL, CHOLHDL, LDLCALC, LDLDIRECT  Wt Readings from Last 3 Encounters:  06/13/19 189 lb 9.6 oz (86 kg)  05/31/19 190 lb 0.6 oz (86.2 kg)  04/08/19 205 lb 0.4 oz (93 kg)     Objective:    Vital Signs:  There were  no vitals taken for this visit.   GEN:  alert and oriented  RESPIRATORY:  no shortness of breath in conversation  PSYCH:  normal affect and mood   ASSESSMENT & PLAN:    1. Anxiety and depression  Please see problem list for details    Time:   Today, I have spent 10 minutes with the patient with telehealth technology discussing the above problems.     Medication Adjustments/Labs and Tests Ordered: Current medicines are reviewed at length with the patient today.  Concerns regarding medicines are outlined above.   Tests Ordered: No orders of the defined types were placed in this encounter.   Medication Changes: No orders of the defined types were placed in this encounter.   Disposition:  Follow up 6 months annual  Signed, Perlie Mayo, NP  06/30/2019 9:12 AM     [place]

## 2019-06-30 NOTE — Assessment & Plan Note (Signed)
He reports improvement.  Did not start Wellbutrin. Likes the vitamin stress relief pill from [employer]

## 2019-07-09 ENCOUNTER — Ambulatory Visit
Admission: EM | Admit: 2019-07-09 | Discharge: 2019-07-09 | Disposition: A | Discharge status: Home / Self Care | Payer: 59 | Attending: Emergency Medicine | Admitting: Emergency Medicine

## 2019-07-09 ENCOUNTER — Ambulatory Visit (INDEPENDENT_AMBULATORY_CARE_PROVIDER_SITE_OTHER): Payer: 59

## 2019-07-09 ENCOUNTER — Other Ambulatory Visit: Payer: Self-pay

## 2019-07-09 DIAGNOSIS — R05 Cough: Secondary | ICD-10-CM | POA: Diagnosis not present

## 2019-07-09 DIAGNOSIS — Z20822 Contact with and (suspected) exposure to covid-19: Secondary | ICD-10-CM

## 2019-07-09 DIAGNOSIS — R0989 Other specified symptoms and signs involving the circulatory and respiratory systems: Secondary | ICD-10-CM

## 2019-07-09 DIAGNOSIS — R059 Cough, unspecified: Secondary | ICD-10-CM

## 2019-07-09 MED ORDER — FLUTICASONE PROPIONATE 50 MCG/ACT NA SUSP: 2.0000 | Freq: Every day | NASAL | 0 refills | Status: DC

## 2019-07-09 MED ORDER — AZITHROMYCIN 250 MG PO TABS: 250.0000 mg | ORAL_TABLET | Freq: Every day | ORAL | 0 refills | Status: DC

## 2019-07-09 MED ORDER — CETIRIZINE HCL 10 MG PO TABS: 10.0000 mg | ORAL_TABLET | Freq: Every day | ORAL | 0 refills | Status: DC

## 2019-07-09 MED ORDER — BENZONATATE 100 MG PO CAPS: 100.0000 mg | ORAL_CAPSULE | Freq: Three times a day (TID) | ORAL | 0 refills | Status: DC

## 2019-07-09 MED ORDER — AMOXICILLIN 500 MG PO CAPS: 1000.0000 mg | ORAL_CAPSULE | Freq: Three times a day (TID) | ORAL | 0 refills | Status: AC

## 2019-07-09 NOTE — Discharge Instructions (Signed)
X-ray negative for pneumonia COVID testing ordered.  It will take between 5-7 days for test results.  Someone will contact you regarding abnormal results.    In the meantime: You should remain isolated in your home for 10 days from symptom onset AND greater than 72 hours after symptoms resolution (absence of fever without the use of fever-reducing medication and improvement in respiratory symptoms), whichever is longer Get plenty of rest and push fluids Tessalon Perles prescribed for cough Use OTC zyrtec for nasal congestion, runny nose, and/or sore throat Use OTC flonase for nasal congestion and runny nose Use medications daily for symptom relief Use OTC medications like ibuprofen or tylenol as needed fever or pain Call or go to the ED if you have any new or worsening symptoms such as fever, worsening cough, shortness of breath, chest tightness, chest pain, turning blue, changes in mental status, etc..Marland Kitchen

## 2019-07-09 NOTE — ED Provider Notes (Signed)
[facility]   [id number] 07/09/19 Arrival Time: [id number]   CC: Cough  SUBJECTIVE: History from: patient.  William Kim is a 54 y.o. male who presents with SOB, productive cough, and back pain x 2 week.  Denies sick exposure to COVID, flu or strep.  Denies recent travel. However, recently started back a work.  Has tried OTC medications without relief.  Denies aggravating factors.  Denies similar symptoms in the past. Complains of associated night sweats and chills.   Denies sinus pain, rhinorrhea, sore throat, wheezing, chest pain, nausea, vomiting, changes in bowel or bladder habits.    ROS: As per HPI.  All other pertinent ROS negative.     Past Medical History:  Diagnosis Date  . A-fib ([place])   . Anxiety   . Cervicalgia   . High cholesterol   . Hypertension   . OSA (obstructive sleep apnea) 05/31/2018   Severe OSA with AHI 79/h with significant oxygen desaturations as low as 58%.   Past Surgical History:  Procedure Laterality Date  . COLONOSCOPY WITH PROPOFOL N/A 08/02/2018   Procedure: COLONOSCOPY WITH PROPOFOL;  Surgeon: Daneil Dolin, MD;  [facility];  Service: Endoscopy;  Laterality: N/A;  12:15pm  . NERVE SURGERY    . None     No Known Allergies No current facility-administered medications on file prior to encounter.   Current Outpatient Medications on File Prior to Encounter  Medication Sig Dispense Refill  . Ascorbic Acid (VITAMIN C PO) Take 1 tablet by mouth daily.    Marland Kitchen aspirin EC 81 MG tablet Take 81 mg by mouth daily.    Marland Kitchen diltiazem (CARDIZEM CD) 120 MG 24 hr capsule Take 1 capsule (120 mg total) by mouth at bedtime. Takes 180mg  in the morning 90 capsule 3  . diltiazem (CARTIA XT) 180 MG 24 hr capsule Take 1 capsule (180 mg total) by mouth every morning. 90 capsule 3  . hydrOXYzine (ATARAX/VISTARIL) 25 MG tablet Take 1 tablet (25 mg total) by mouth every 8 (eight) hours as needed for anxiety. 60 tablet 0  . sildenafil (VIAGRA) 100 MG tablet  Take 1 tablet by mouth daily as needed.    . simvastatin (ZOCOR) 10 MG tablet Take 10 mg by mouth daily.    . valACYclovir (VALTREX) 500 MG tablet Take 1 tablet by mouth daily.     Social History   Socioeconomic History  . Marital status: Married    Spouse name: Archie Patten   . Number of children: 2  . Years of education: Not on file  . Highest education level: 12th grade  Occupational History  . Not on file  Tobacco Use  . Smoking status: Former Smoker    Types: Cigarettes  . Smokeless tobacco: Never Used  . Tobacco comment: quit smoking in 2012/2013, quit smoking August 16th  Substance and Sexual Activity  . Alcohol use: Not Currently    Comment: stopped 3-4 months ago  . Drug use: No  . Sexual activity: Not on file  Other Topics Concern  . Not on file  Social History Narrative   Lives with wife Archie Patten married 3/10 49 years   2 children   Son 36 lives in [place], 1 grandbaby 73 months old 2020   Daughter 75 she is living at home      Enjoys fishing      Diet: eats veggies and salmon, avoid red meats   Caffeine: does not drink    Water: 2 bottles day  Wear seat belt    Smoke detectors   Does not use phone while driving    Social Determinants of Health   Financial Resource Strain: Low Risk   . Difficulty of Paying Living Expenses: Not hard at all  Food Insecurity: No Food Insecurity  . Worried About Charity fundraiser in the Last Year: Never true  . Ran Out of Food in the Last Year: Never true  Transportation Needs: No Transportation Needs  . Lack of Transportation (Medical): No  . Lack of Transportation (Non-Medical): No  Physical Activity: Insufficiently Active  . Days of Exercise per Week: 5 days  . Minutes of Exercise per Session: 20 min  Stress: Stress Concern Present  . Feeling of Stress : To some extent  Social Connections: Somewhat Isolated  . Frequency of Communication with Friends and Family: Three times a week  . Frequency of Social Gatherings with  Friends and Family: Three times a week  . Attends Religious Services: Never  . Active Member of Clubs or Organizations: No  . Attends Archivist Meetings: Never  . Marital Status: Married  Human resources officer Violence: Not At Risk  . Fear of Current or Ex-Partner: No  . Emotionally Abused: No  . Physically Abused: No  . Sexually Abused: No   Family History  Problem Relation Age of Onset  . Healthy Mother   . Healthy Father   . Colon cancer Neg Hx   . Colon polyps Neg Hx     OBJECTIVE:  Vitals:   07/09/19 1427  BP: 126/83  Pulse: 88  Resp: 16  Temp: 98.4 F (36.9 C)  TempSrc: Oral  SpO2: 96%     General appearance: alert; appears fatigued, but nontoxic; speaking in full sentences and tolerating own secretions HEENT: NCAT; Ears: EACs clear, TMs pearly gray; Eyes: PERRL.  EOM grossly intact. Nose: nares patent without rhinorrhea, Throat: oropharynx clear, tonsils non erythematous or enlarged, uvula midline  Neck: supple without LAD Lungs: unlabored respirations, symmetrical air entry; cough: mild; no respiratory distress; Crackles heard over RT lower lobe Heart: regular rate and rhythm.   Skin: warm and dry Psychological: alert and cooperative; normal mood and affect  DIAGNOSTIC STUDIES:  DG Chest 2 View  Result Date: 07/09/2019 CLINICAL DATA:  Cough for several weeks. EXAM: CHEST - 2 VIEW COMPARISON:  PA and lateral chest 04/08/2019 and 01/27/2017. FINDINGS: The lungs are clear. Heart size is normal. No pneumothorax or pleural fluid. No acute or focal bony abnormality. IMPRESSION: No acute disease. Electronically Signed   By: Inge Rise M.D.   On: 07/09/2019 15:01    X-rays negative for cardiopulmonary disease  I have reviewed the x-rays myself and the radiologist interpretation. I am in agreement with the radiologist interpretation.     ASSESSMENT & PLAN:  1. Suspected COVID-19 virus infection   2. Cough   3. Respiratory crackles at right lung base      Meds ordered this encounter  Medications  . cetirizine (ZYRTEC) 10 MG tablet    Sig: Take 1 tablet (10 mg total) by mouth daily.    Dispense:  30 tablet    Refill:  0    Order Specific Question:   Supervising Provider    Answer:   Raylene Everts [id number]  . fluticasone (FLONASE) 50 MCG/ACT nasal spray    Sig: Place 2 sprays into both nostrils daily.    Dispense:  16 g    Refill:  0    Order  Specific Question:   Supervising Provider    Answer:   Raylene Everts [id number]  . benzonatate (TESSALON) 100 MG capsule    Sig: Take 1 capsule (100 mg total) by mouth every 8 (eight) hours.    Dispense:  21 capsule    Refill:  0    Order Specific Question:   Supervising Provider    Answer:   Raylene Everts [id number]  . amoxicillin (AMOXIL) 500 MG capsule    Sig: Take 2 capsules (1,000 mg total) by mouth 3 (three) times daily for 10 days.    Dispense:  60 capsule    Refill:  0    Order Specific Question:   Supervising Provider    Answer:   Raylene Everts [id number]  . azithromycin (ZITHROMAX) 250 MG tablet    Sig: Take 1 tablet (250 mg total) by mouth daily. Take first 2 tablets together, then 1 every day until finished.    Dispense:  6 tablet    Refill:  0    Order Specific Question:   Supervising Provider    Answer:   Raylene Everts [id number]    X-ray negative for pneumonia COVID testing ordered.  It will take between 5-7 days for test results.  Someone will contact you regarding abnormal results.    In the meantime: You should remain isolated in your home for 10 days from symptom onset AND greater than 72 hours after symptoms resolution (absence of fever without the use of fever-reducing medication and improvement in respiratory symptoms), whichever is longer Get plenty of rest and push fluids Tessalon Perles prescribed for cough Use OTC zyrtec for nasal congestion, runny nose, and/or sore throat Use OTC flonase for nasal congestion and runny nose Use  medications daily for symptom relief Use OTC medications like ibuprofen or tylenol as needed fever or pain Call or go to the ED if you have any new or worsening symptoms such as fever, worsening cough, shortness of breath, chest tightness, chest pain, turning blue, changes in mental status, etc...   Based on PE and length of symptoms will cover for possible PNA.  Amoxicillin and z-pak sent in to pharmacy on file.   Reviewed expectations re: course of current medical issues. Questions answered. Outlined signs and symptoms indicating need for more acute intervention. Patient verbalized understanding. After Visit Summary given.         Lestine Box, PA-C 07/09/19 1534

## 2019-07-09 NOTE — ED Triage Notes (Signed)
Pt presents to UC w/ c/o sob, cough, back pain x2 weeks. Pt is concerned for pneumonia.

## 2019-07-10 LAB — NOVEL CORONAVIRUS, NAA: SARS-CoV-2, NAA: NOT DETECTED

## 2019-07-19 ENCOUNTER — Other Ambulatory Visit: Payer: Self-pay

## 2019-07-19 DIAGNOSIS — F329 Major depressive disorder, single episode, unspecified: Secondary | ICD-10-CM

## 2019-07-19 DIAGNOSIS — F32A Depression, unspecified: Secondary | ICD-10-CM

## 2019-07-19 MED ORDER — HYDROXYZINE HCL 25 MG PO TABS: 25.0000 mg | ORAL_TABLET | Freq: Three times a day (TID) | ORAL | 5 refills | Status: DC | PRN

## 2019-10-31 ENCOUNTER — Other Ambulatory Visit: Payer: Self-pay

## 2019-10-31 ENCOUNTER — Encounter: Payer: Self-pay | Admitting: Emergency Medicine

## 2019-10-31 ENCOUNTER — Ambulatory Visit
Admission: EM | Admit: 2019-10-31 | Discharge: 2019-10-31 | Disposition: A | Discharge status: Home / Self Care | Payer: 59 | Attending: Emergency Medicine | Admitting: Emergency Medicine

## 2019-10-31 DIAGNOSIS — R5383 Other fatigue: Secondary | ICD-10-CM

## 2019-10-31 LAB — POCT URINALYSIS DIP (MANUAL ENTRY)
Bilirubin, UA: NEGATIVE
Blood, UA: NEGATIVE
Glucose, UA: NEGATIVE mg/dL
Ketones, POC UA: NEGATIVE mg/dL
Leukocytes, UA: NEGATIVE
Nitrite, UA: NEGATIVE
Protein Ur, POC: NEGATIVE mg/dL
Spec Grav, UA: <=1.005 — AB (ref 1.010–1.025)
Urobilinogen, UA: 0.2 E.U./dL
pH, UA: 5.5 (ref 5.0–8.0)

## 2019-10-31 NOTE — Discharge Instructions (Addendum)
POCT urine analysis was inconclusive for urinary tract infection CBC, CMP, TSH, free T4 and T3 and hemoglobin A1c were completed.  We will call you with abnormal result. Follow-up with cardiologist regarding A. Fib Follow-up with sleep medicine regarding sleep apnea Return or go to ED for worsening of symptoms

## 2019-10-31 NOTE — ED Triage Notes (Signed)
complains of no energy for 6-8 months.  Complains of no strength or energy.  "I feel like I'm melting"  Denies having any pain

## 2019-10-31 NOTE — ED Provider Notes (Signed)
[facility]    CSN: [id number] Arrival date & time: 10/31/19  1631      History   Chief Complaint Chief Complaint  Patient presents with  . Fatigue    HPI William Kim is a 54 y.o. male.   With history of A. fib and sleep apnea and on BiPAP presented to the urgent care with a complaint of fatigue for the past 86-month..  Denies any precipitating event.  Reported he hasfatigue for a while but states symptom is worse today.  Denies any other symptoms.  Has not seen PCP in a long time.  Reports taking all his medication as prescribed daily.  Has completed Covid vaccine immunization few weeks ago.  Denies worsening of known aggravating factor.  Denies chills, fever, nausea, vomiting, diarrhea, pain, confusion.  The history is provided by the patient. No language interpreter was used.    Past Medical History:  Diagnosis Date  . A-fib ([place])   . Anxiety   . Cervicalgia   . High cholesterol   . Hypertension   . OSA (obstructive sleep apnea) 05/31/2018   Severe OSA with AHI 79/h with significant oxygen desaturations as low as 58%.    Patient Active Problem List   Diagnosis Date Noted  . Atrial fibrillation ([place]) 05/31/2019  . PTSD (post-traumatic stress disorder) 05/31/2019  . Anxiety and depression 05/31/2019  . Sleep disturbance 05/31/2019  . Encounter for screening colonoscopy 07/02/2018  . OSA (obstructive sleep apnea) 05/31/2018  . Benign essential HTN 05/31/2018  . Obesity (BMI 30-39.9) 05/31/2018    Past Surgical History:  Procedure Laterality Date  . COLONOSCOPY WITH PROPOFOL N/A 08/02/2018   Procedure: COLONOSCOPY WITH PROPOFOL;  Surgeon: Daneil Dolin, MD;  [facility];  Service: Endoscopy;  Laterality: N/A;  12:15pm  . NERVE SURGERY    . None         Home Medications    Prior to Admission medications   Medication Sig Start Date End Date Taking? Authorizing Provider  Ascorbic Acid (VITAMIN C PO) Take 1 tablet by mouth daily.    Yes [provider]  aspirin EC 81 MG tablet Take 81 mg by mouth daily.   Yes [provider]  cetirizine (ZYRTEC) 10 MG tablet Take 1 tablet (10 mg total) by mouth daily. 07/09/19  Yes Wurst, [place], PA-C  diltiazem (CARDIZEM CD) 120 MG 24 hr capsule Take 1 capsule (120 mg total) by mouth at bedtime. Takes 180mg  in the morning 06/13/19  Yes Sherran Needs, NP  diltiazem (CARTIA XT) 180 MG 24 hr capsule Take 1 capsule (180 mg total) by mouth every morning. 06/13/19  Yes Sherran Needs, NP  simvastatin (ZOCOR) 10 MG tablet Take 10 mg by mouth daily.   Yes [provider]  valACYclovir (VALTREX) 500 MG tablet Take 1 tablet by mouth daily. 03/30/16  Yes [provider]  azithromycin (ZITHROMAX) 250 MG tablet Take 1 tablet (250 mg total) by mouth daily. Take first 2 tablets together, then 1 every day until finished. 07/09/19   Wurst, [place], PA-C  benzonatate (TESSALON) 100 MG capsule Take 1 capsule (100 mg total) by mouth every 8 (eight) hours. 07/09/19   Wurst, [place], PA-C  fluticasone (FLONASE) 50 MCG/ACT nasal spray Place 2 sprays into both nostrils daily. 07/09/19   Wurst, [place], PA-C  hydrOXYzine (ATARAX/VISTARIL) 25 MG tablet Take 1 tablet (25 mg total) by mouth every 8 (eight) hours as needed for anxiety. 07/19/19   Tula Nakayama  E, MD  sildenafil (VIAGRA) 100 MG tablet Take 1 tablet by mouth daily as needed. 03/16/19   [provider]    Family History Family History  Problem Relation Age of Onset  . Healthy Mother   . Healthy Father   . Colon cancer Neg Hx   . Colon polyps Neg Hx     Social History Social History   Tobacco Use  . Smoking status: Former Smoker    Types: Cigarettes  . Smokeless tobacco: Never Used  . Tobacco comment: quit smoking in 2012/2013, quit smoking August 16th  Substance Use Topics  . Alcohol use: Not Currently    Comment: stopped 3-4 months ago  . Drug use: No     Allergies   Patient has no known  allergies.   Review of Systems Review of Systems   Physical Exam Triage Vital Signs ED Triage Vitals  Enc Vitals Group     BP 10/31/19 1644 129/79     Pulse Rate 10/31/19 1644 81     Resp 10/31/19 1644 18     Temp 10/31/19 1644 98.9 F (37.2 C)     Temp Source 10/31/19 1644 Oral     SpO2 10/31/19 1644 98 %     Weight --      Height --      Head Circumference --      Peak Flow --      Pain Score 10/31/19 1641 0     Pain Loc --      Pain Edu? --      Excl. in [place]? --    No data found.  Updated Vital Signs BP 129/79 (BP Location: Right Arm)   Pulse 81   Temp 98.9 F (37.2 C) (Oral)   Resp 18   SpO2 98%   Visual Acuity Right Eye Distance:   Left Eye Distance:   Bilateral Distance:    Right Eye Near:   Left Eye Near:    Bilateral Near:     Physical Exam Vitals and nursing note reviewed.  Constitutional:      General: He is not in acute distress.    Appearance: Normal appearance. He is normal weight. He is not ill-appearing, toxic-appearing or diaphoretic.  HENT:     Head: Normocephalic.     Right Ear: Tympanic membrane, ear canal and external ear normal. There is no impacted cerumen.     Left Ear: Ear canal and external ear normal. There is no impacted cerumen.  Cardiovascular:     Rate and Rhythm: Normal rate and regular rhythm.     Pulses: Normal pulses.     Heart sounds: Normal heart sounds. No murmur. No friction rub. No gallop.   Pulmonary:     Effort: Pulmonary effort is normal. No respiratory distress.     Breath sounds: Normal breath sounds. No stridor. No wheezing, rhonchi or rales.  Chest:     Chest wall: No tenderness.  Neurological:     General: No focal deficit present.     Mental Status: He is alert and oriented to person, place, and time.     Cranial Nerves: No cranial nerve deficit.     Sensory: No sensory deficit.     Motor: No weakness.     Coordination: Coordination normal.     Gait: Gait normal.     Deep Tendon Reflexes: Reflexes  normal.      UC Treatments / Results  Labs (all labs ordered are listed, but only  abnormal results are displayed) Labs Reviewed  POCT URINALYSIS DIP (MANUAL ENTRY) - Abnormal; Notable for the following components:      Result Value   Spec Grav, UA <=1.005 (*)    All other components within normal limits  URINE CULTURE  CBC WITH DIFFERENTIAL/PLATELET  BASIC METABOLIC PANEL  TSH  T3, FREE  T4, FREE  HEMOGLOBIN A1C    EKG   Radiology No results found.  Procedures Procedures (including critical care time)  Medications Ordered in UC Medications - No data to display  Initial Impression / Assessment and Plan / UC Course  I have reviewed the triage vital signs and the nursing notes.  Pertinent labs & imaging results that were available during my care of the patient were reviewed by me and considered in my medical decision making (see chart for details).    Patient is stable at discharge.  Physical exam is benign.  POCT urinalysis was inconclusive for UTI.  CBC, CMP, TSH, free T3 and T4 and hemoglobin A1c were completed.  Symptoms likely could be from sleep apnea as he has not been using his BiPAP regularly.  Patient was advised to follow-up with PCP, cardiologist regarding A. fib and sleep medicine.  Final Clinical Impressions(s) / UC Diagnoses   Final diagnoses:  Fatigue, unspecified type     Discharge Instructions     POCT urine analysis was inconclusive for urinary tract infection CBC, CMP, TSH, free T4 and T3 and hemoglobin A1c were completed.  We will call you with abnormal result. Follow-up with cardiologist regarding A. Fib Follow-up with sleep medicine regarding sleep apnea Return or go to ED for worsening of symptoms    ED Prescriptions    None     PDMP not reviewed this encounter.   Emerson Monte, FNP 10/31/19 1740

## 2019-11-01 LAB — URINE CULTURE: Culture: NO GROWTH

## 2019-11-02 LAB — CBC WITH DIFFERENTIAL/PLATELET
Basophils Absolute: 0 10*3/uL (ref 0.0–0.2)
Basos: 1 %
EOS (ABSOLUTE): 0.1 10*3/uL (ref 0.0–0.4)
Eos: 2 %
Hematocrit: 38.2 % (ref 37.5–51.0)
Hemoglobin: 12.9 g/dL — ABNORMAL LOW (ref 13.0–17.7)
Immature Grans (Abs): 0 10*3/uL (ref 0.0–0.1)
Immature Granulocytes: 0 %
Lymphocytes Absolute: 2.2 10*3/uL (ref 0.7–3.1)
Lymphs: 36 %
MCH: 30.4 pg (ref 26.6–33.0)
MCHC: 33.8 g/dL (ref 31.5–35.7)
MCV: 90 fL (ref 79–97)
Monocytes Absolute: 0.6 10*3/uL (ref 0.1–0.9)
Monocytes: 10 %
Neutrophils Absolute: 3.2 10*3/uL (ref 1.4–7.0)
Neutrophils: 51 %
Platelets: 224 10*3/uL (ref 150–450)
RBC: 4.24 x10E6/uL (ref 4.14–5.80)
RDW: 15.1 % (ref 11.6–15.4)
WBC: 6.1 10*3/uL (ref 3.4–10.8)

## 2019-11-02 LAB — BASIC METABOLIC PANEL
BUN/Creatinine Ratio: 19 (ref 9–20)
BUN: 20 mg/dL (ref 6–24)
CO2: 23 mmol/L (ref 20–29)
Calcium: 9.7 mg/dL (ref 8.7–10.2)
Chloride: 105 mmol/L (ref 96–106)
Creatinine, Ser: 1.06 mg/dL (ref 0.76–1.27)
GFR calc Af Amer: 93 mL/min/{1.73_m2} (ref >59)
GFR calc non Af Amer: 80 mL/min/{1.73_m2} (ref >59)
Glucose: 97 mg/dL (ref 65–99)
Potassium: 4.2 mmol/L (ref 3.5–5.2)
Sodium: 141 mmol/L (ref 134–144)

## 2019-11-02 LAB — HEMOGLOBIN A1C
Est. average glucose Bld gHb Est-mCnc: 108 mg/dL
Hgb A1c MFr Bld: 5.4 % (ref 4.8–5.6)

## 2019-11-02 LAB — TSH: TSH: 1.51 u[IU]/mL (ref 0.450–4.500)

## 2019-11-02 LAB — T3, FREE: T3, Free: 3.4 pg/mL (ref 2.0–4.4)

## 2019-11-02 LAB — T4, FREE: Free T4: 1.51 ng/dL (ref 0.82–1.77)

## 2019-11-17 ENCOUNTER — Encounter: Payer: Self-pay | Admitting: Family Medicine

## 2019-11-19 ENCOUNTER — Ambulatory Visit
Admission: EM | Admit: 2019-11-19 | Discharge: 2019-11-19 | Disposition: A | Discharge status: Other Acute Inpatient Hospital | Payer: 59 | Source: Home / Self Care

## 2019-11-19 ENCOUNTER — Encounter: Payer: Self-pay | Admitting: Emergency Medicine

## 2019-11-19 ENCOUNTER — Emergency Department (HOSPITAL_COMMUNITY): Payer: 59

## 2019-11-19 ENCOUNTER — Other Ambulatory Visit: Payer: Self-pay

## 2019-11-19 ENCOUNTER — Encounter (HOSPITAL_COMMUNITY): Payer: Self-pay | Admitting: Emergency Medicine

## 2019-11-19 ENCOUNTER — Emergency Department (HOSPITAL_COMMUNITY)
Admission: EM | Admit: 2019-11-19 | Discharge: 2019-11-19 | Disposition: A | Discharge status: Home / Self Care | Payer: 59 | Attending: Emergency Medicine | Admitting: Emergency Medicine

## 2019-11-19 DIAGNOSIS — Z87891 Personal history of nicotine dependence: Secondary | ICD-10-CM | POA: Insufficient documentation

## 2019-11-19 DIAGNOSIS — Z79899 Other long term (current) drug therapy: Secondary | ICD-10-CM | POA: Insufficient documentation

## 2019-11-19 DIAGNOSIS — Z7982 Long term (current) use of aspirin: Secondary | ICD-10-CM | POA: Insufficient documentation

## 2019-11-19 DIAGNOSIS — I10 Essential (primary) hypertension: Secondary | ICD-10-CM | POA: Insufficient documentation

## 2019-11-19 DIAGNOSIS — R195 Other fecal abnormalities: Secondary | ICD-10-CM | POA: Diagnosis present

## 2019-11-19 DIAGNOSIS — R5383 Other fatigue: Secondary | ICD-10-CM

## 2019-11-19 LAB — CBC WITH DIFFERENTIAL/PLATELET
Abs Immature Granulocytes: 0.01 10*3/uL (ref 0.00–0.07)
Basophils Absolute: 0 10*3/uL (ref 0.0–0.1)
Basophils Relative: 0 %
Eosinophils Absolute: 0 10*3/uL (ref 0.0–0.5)
Eosinophils Relative: 1 %
HCT: 39.9 % (ref 39.0–52.0)
Hemoglobin: 13.4 g/dL (ref 13.0–17.0)
Immature Granulocytes: 0 %
Lymphocytes Relative: 35 %
Lymphs Abs: 1.7 10*3/uL (ref 0.7–4.0)
MCH: 29.9 pg (ref 26.0–34.0)
MCHC: 33.6 g/dL (ref 30.0–36.0)
MCV: 89.1 fL (ref 80.0–100.0)
Monocytes Absolute: 0.5 10*3/uL (ref 0.1–1.0)
Monocytes Relative: 10 %
Neutro Abs: 2.6 10*3/uL (ref 1.7–7.7)
Neutrophils Relative %: 54 %
Platelets: 186 10*3/uL (ref 150–400)
RBC: 4.48 MIL/uL (ref 4.22–5.81)
RDW: 14.1 % (ref 11.5–15.5)
WBC: 4.8 10*3/uL (ref 4.0–10.5)
nRBC: 0 % (ref 0.0–0.2)

## 2019-11-19 LAB — COMPREHENSIVE METABOLIC PANEL
ALT: 49 U/L — ABNORMAL HIGH (ref 0–44)
AST: 37 U/L (ref 15–41)
Albumin: 4.6 g/dL (ref 3.5–5.0)
Alkaline Phosphatase: 79 U/L (ref 38–126)
Anion gap: 7 (ref 5–15)
BUN: 14 mg/dL (ref 6–20)
CO2: 25 mmol/L (ref 22–32)
Calcium: 9.3 mg/dL (ref 8.9–10.3)
Chloride: 107 mmol/L (ref 98–111)
Creatinine, Ser: 0.84 mg/dL (ref 0.61–1.24)
GFR calc Af Amer: >60 mL/min (ref >60)
GFR calc non Af Amer: >60 mL/min (ref >60)
Glucose, Bld: 84 mg/dL (ref 70–99)
Potassium: 3.7 mmol/L (ref 3.5–5.1)
Sodium: 139 mmol/L (ref 135–145)
Total Bilirubin: 0.8 mg/dL (ref 0.3–1.2)
Total Protein: 7.1 g/dL (ref 6.5–8.1)

## 2019-11-19 MED ORDER — SODIUM CHLORIDE 0.9 % IV BOLUS
1000.0000 mL | Freq: Once | INTRAVENOUS | Status: AC
  Administered 2019-11-19: 1000 mL via INTRAVENOUS

## 2019-11-19 NOTE — ED Provider Notes (Signed)
[name] EMERGENCY DEPARTMENT Provider Note   CSN: [id number] Arrival date & time: 11/19/19  [id number]     History Chief Complaint  Patient presents with  . Blood In Stools    William Kim is a 54 y.o. male with a history of atrial fibrillation not on anticoagulation, OSA, hypertension, hypercholesterolemia, and anxiety/derpession who presents to the ED with concern about possible blood in his stool x 8 weeks.  Patient states that initially he noted that his stools were brown and appeared to have some red content externally, however about 1 month ago he noted that it became fairly dark in color.  He states that each of his bowel movements are dark.  He is concerned that there may be blood in his stool.  He states that he has some associated mild intermittent crampy lower abdominal pain prior to bowel movements, fatigue, lightheadedness with position changes, some shortness of breath when fatigue feels really bad, and anxiety.  Other than what is noted above no alleviating or aggravating factors.  He was seen in urgent care at the beginning of the month and was told he was mildly anemic and therefore told to come to the emergency department today.  He denies fever, chills, chest pain, syncope, nausea, vomiting, diaphoresis, issues urinating, diarrhea, constipation, leg pain/swelling, hemoptysis, recent surgery/trauma, recent long travel, hormone use, personal hx of cancer, or hx of DVT/PE.  He denies alcohol use.  He does not use blood thinners.  He states that he does take NSAIDs some days.     HPI     Past Medical History:  Diagnosis Date  . A-fib ([place])   . Anxiety   . Cervicalgia   . High cholesterol   . Hypertension   . OSA (obstructive sleep apnea) 05/31/2018   Severe OSA with AHI 79/h with significant oxygen desaturations as low as 58%.    Patient Active Problem List   Diagnosis Date Noted  . Atrial fibrillation ([place]) 05/31/2019  . PTSD (post-traumatic stress disorder)  05/31/2019  . Anxiety and depression 05/31/2019  . Sleep disturbance 05/31/2019  . Encounter for screening colonoscopy 07/02/2018  . OSA (obstructive sleep apnea) 05/31/2018  . Benign essential HTN 05/31/2018  . Obesity (BMI 30-39.9) 05/31/2018    Past Surgical History:  Procedure Laterality Date  . COLONOSCOPY WITH PROPOFOL N/A 08/02/2018   Procedure: COLONOSCOPY WITH PROPOFOL;  Surgeon: Daneil Dolin, MD;  Location: [facility] SUITE;  Service: Endoscopy;  Laterality: N/A;  12:15pm  . NERVE SURGERY    . None         Family History  Problem Relation Age of Onset  . Healthy Mother   . Healthy Father   . Colon cancer Neg Hx   . Colon polyps Neg Hx     Social History   Tobacco Use  . Smoking status: Former Smoker    Types: Cigarettes  . Smokeless tobacco: Never Used  . Tobacco comment: quit smoking in 2012/2013, quit smoking August 16th  Substance Use Topics  . Alcohol use: Not Currently    Comment: stopped 3-4 months ago  . Drug use: No    Home Medications Prior to Admission medications   Medication Sig Start Date End Date Taking? Authorizing Provider  Ascorbic Acid (VITAMIN C PO) Take 1 tablet by mouth daily.    [provider]  aspirin EC 81 MG tablet Take 81 mg by mouth daily.    [provider]  azithromycin (ZITHROMAX) 250 MG tablet Take 1  tablet (250 mg total) by mouth daily. Take first 2 tablets together, then 1 every day until finished. Patient not taking: Reported on 11/19/2019 07/09/19   Wurst, [place], PA-C  benzonatate (TESSALON) 100 MG capsule Take 1 capsule (100 mg total) by mouth every 8 (eight) hours. Patient not taking: Reported on 11/19/2019 07/09/19   Wurst, [place], PA-C  cetirizine (ZYRTEC) 10 MG tablet Take 1 tablet (10 mg total) by mouth daily. 07/09/19   Wurst, [place], PA-C  diltiazem (CARDIZEM CD) 120 MG 24 hr capsule Take 1 capsule (120 mg total) by mouth at bedtime. Takes 180mg  in the morning 06/13/19   Sherran Needs, NP    diltiazem (CARTIA XT) 180 MG 24 hr capsule Take 1 capsule (180 mg total) by mouth every morning. 06/13/19   Sherran Needs, NP  fluticasone (FLONASE) 50 MCG/ACT nasal spray Place 2 sprays into both nostrils daily. 07/09/19   Wurst, [place], PA-C  hydrOXYzine (ATARAX/VISTARIL) 25 MG tablet Take 1 tablet (25 mg total) by mouth every 8 (eight) hours as needed for anxiety. 07/19/19   Fayrene Helper, MD  sildenafil (VIAGRA) 100 MG tablet Take 1 tablet by mouth daily as needed. 03/16/19   [provider]  simvastatin (ZOCOR) 10 MG tablet Take 10 mg by mouth daily.    [provider]  valACYclovir (VALTREX) 500 MG tablet Take 1 tablet by mouth daily. 03/30/16   [provider]    Allergies    Patient has no known allergies.  Review of Systems   Review of Systems  Constitutional: Negative for chills and fever.  Respiratory: Positive for shortness of breath. Negative for cough.   Cardiovascular: Negative for chest pain and leg swelling.  Gastrointestinal: Positive for abdominal pain and blood in stool. Negative for diarrhea, nausea and vomiting.  Genitourinary: Negative for dysuria, frequency and urgency.  Neurological: Positive for light-headedness. Negative for syncope.  All other systems reviewed and are negative.   Physical Exam Updated Vital Signs BP (!) 144/100 (BP Location: Right Arm)   Pulse 76   Temp 98.2 F (36.8 C) (Oral)   Resp 16   Ht 6' (1.829 m)   Wt 81.6 kg   SpO2 100%   BMI 24.41 kg/m   Physical Exam Vitals and nursing note reviewed.  Constitutional:      General: He is not in acute distress.    Appearance: He is well-developed. He is not toxic-appearing.  HENT:     Head: Normocephalic and atraumatic.  Eyes:     General:        Right eye: No discharge.        Left eye: No discharge.     Conjunctiva/sclera: Conjunctivae normal.     Comments: No conjunctival pallor noted.  Cardiovascular:     Rate and Rhythm: Normal rate and  regular rhythm.  Pulmonary:     Effort: Pulmonary effort is normal. No respiratory distress.     Breath sounds: Normal breath sounds. No wheezing, rhonchi or rales.  Abdominal:     General: There is no distension.     Palpations: Abdomen is soft.     Tenderness: There is no abdominal tenderness. There is no guarding or rebound.  Genitourinary:    Comments: Chaperone present.  No notable external hemorrhoids or fissures.  DRE with soft brown stool.  No melena or hematochezia noted.  Fecal occult negative. Musculoskeletal:     Cervical back: Neck supple.  Skin:    General: Skin is warm and  dry.     Findings: No rash.  Neurological:     General: No focal deficit present.     Mental Status: He is alert.     Comments: Clear speech.   Psychiatric:        Behavior: Behavior normal.     ED Results / Procedures / Treatments   Labs (all labs ordered are listed, but only abnormal results are displayed) Labs Reviewed  COMPREHENSIVE METABOLIC PANEL - Abnormal; Notable for the following components:      Result Value   ALT 49 (*)    All other components within normal limits  CBC WITH DIFFERENTIAL/PLATELET  POC OCCULT BLOOD, ED    EKG EKG Interpretation  Date/Time:  Saturday Nov 19 2019 10:02:07 EDT Ventricular Rate:  63 PR Interval:    QRS Duration: 92 QT Interval:  395 QTC Calculation: 405 R Axis:   60 Text Interpretation: Sinus rhythm Probable left atrial enlargement ST elev, probable normal early repol pattern Confirmed by Fredia Sorrow [phone]) on 11/19/2019 11:34:16 AM   Radiology DG Chest 2 View  Result Date: 11/19/2019 CLINICAL DATA:  Shortness of breath EXAM: CHEST - 2 VIEW COMPARISON:  07/09/2019 FINDINGS: The heart size and mediastinal contours are within normal limits. Both lungs are clear. The visualized skeletal structures are unremarkable. IMPRESSION: No active cardiopulmonary disease. Electronically Signed   By: Inez Catalina M.D.   On: 11/19/2019 11:04     Procedures Procedures (including critical care time)  12:56 PM Cardiac monitoring reveals 68 BPM NSR as reviewed and interpreted by me. Cardiac monitoring was ordered due to fatigue & lightheadedness and to monitor patient for dysrhythmia.   Medications Ordered in ED Medications  sodium chloride 0.9 % bolus 1,000 mL (1,000 mLs Intravenous New Bag/Given 11/19/19 1144)    ED Course  I have reviewed the triage vital signs and the nursing notes.  Pertinent labs & imaging results that were available during my care of the patient were reviewed by me and considered in my medical decision making (see chart for details).    MDM Rules/Calculators/A&P                     Patient presents to the ED with concern for possible blood in his stool and fatigue over the past 2 months.. Nontoxic, vitals without significant abnormality.  Overall benign physical exam.  Abdomen is nontender without peritoneal signs.  DRE with soft brown stool, fecal occult negative. Additional history obtained:  Additional history obtained from previous records obtained and reviewed.  Colonoscopy 07/2018:  - The entire examined colon is normal. - The distal rectum and anal verge are normal on retroflexion view. - No specimens collected.  EKG: No STEMI, probably early repole. Lab Tests:  I Ordered, reviewed, and interpreted labs, which included:  CBC: No anemia or leukocytosis CMP: Mildly elevated ALT similar to prior.  No electrolyte derangement.  Renal function preserved. Imaging Studies ordered:  I ordered imaging studies which included chest x-ray, I independently visualized and interpreted imaging which showed No active cardiopulmonary disease.  ED Course:  Orthostatic VS for the past 24 hrs:  BP- Lying Pulse- Lying BP- Sitting Pulse- Sitting BP- Standing at 0 minutes Pulse- Standing at 0 minutes  11/19/19 1004 128/86 67 124/81 74 121/81 72    Patient with overall reassuring work-up in the emergency  department.  His stool is brown and soft on digital rectal exam, I personally performed fecal occult testing which was negative.  He  has a stable hemoglobin and hematocrit.  Not suspect significant GI bleed at this time.  No anemia to lead to his fatigue, lightheadedness, or dyspnea.  No significant electrolyte derangement.  No renal failure.  Chest x-ray without acute process leading to dyspnea.  Patient is low risk [place], doubt pulmonary embolism.  EKG with likely early repole, patient without chest pain to raise concern for ACS.  Cardiac monitor reviewed and overall reassuring.  Patient is not orthostatic.  He overall appears appropriate for discharge home with primary care follow-up. I discussed results, treatment plan, need for follow-up, and return precautions with the patient. Provided opportunity for questions, patient confirmed understanding and is in agreement with plan.   Portions of this note were generated with [employer]. Dictation errors may occur despite best attempts at proofreading.   Final Clinical Impression(s) / ED Diagnoses Final diagnoses:  Fatigue, unspecified type    Rx / DC Orders ED Discharge Orders    None       Amaryllis Dyke, PA-C 11/19/19 1300    Fredia Sorrow, MD 12/20/19 2234

## 2019-11-19 NOTE — ED Triage Notes (Signed)
Pt sts lower abd pain x 3 weeks with black stool; pt unsure of cause and denies hx of same

## 2019-11-19 NOTE — Discharge Instructions (Signed)
You were seen in the emergency department today for fatigue, lightheadedness, concern for abnormally colored stools.  Your work-up in the ER was reassuring.  There was no blood in your stool.  You are not anemic.  Your electrolytes were normal.  Your chest x-ray was normal.  We are unsure of the exact cause of your symptoms.  Please be sure to stay well-hydrated and eat 3 meals per day.  Please follow-up with your primary care provider within 3 days for reevaluation and possible further management.  Return to the ER for new or worsening symptoms or any other concerns.

## 2019-11-19 NOTE — ED Triage Notes (Signed)
Patient c/o blood in stoll x8 weeks. Per patient thought it was originally due to the beets and juice he was drinking but stools became dark and tarry. Patient also reports some lower abd pain and fatigue. Denies any nausea or vomiting. Patient went to Urgent Care 5/3 and was sent here today due to low hgb.

## 2019-11-22 ENCOUNTER — Encounter: Payer: Self-pay | Admitting: Family Medicine

## 2019-12-23 ENCOUNTER — Other Ambulatory Visit: Payer: Self-pay | Admitting: Family Medicine

## 2019-12-23 DIAGNOSIS — F32A Depression, unspecified: Secondary | ICD-10-CM

## 2019-12-29 ENCOUNTER — Encounter: Payer: Self-pay | Admitting: Family Medicine

## 2019-12-29 ENCOUNTER — Ambulatory Visit (INDEPENDENT_AMBULATORY_CARE_PROVIDER_SITE_OTHER): Payer: 59 | Admitting: Family Medicine

## 2019-12-29 ENCOUNTER — Other Ambulatory Visit: Payer: Self-pay

## 2019-12-29 VITALS — BP 107/71 | HR 78 | Temp 97.0°F | Resp 18 | Ht 72.0 in | Wt 179.8 lb

## 2019-12-29 DIAGNOSIS — Z0001 Encounter for general adult medical examination with abnormal findings: Secondary | ICD-10-CM | POA: Diagnosis not present

## 2019-12-29 DIAGNOSIS — I10 Essential (primary) hypertension: Secondary | ICD-10-CM

## 2019-12-29 DIAGNOSIS — Z1159 Encounter for screening for other viral diseases: Secondary | ICD-10-CM | POA: Insufficient documentation

## 2019-12-29 DIAGNOSIS — R5383 Other fatigue: Secondary | ICD-10-CM | POA: Insufficient documentation

## 2019-12-29 DIAGNOSIS — Z125 Encounter for screening for malignant neoplasm of prostate: Secondary | ICD-10-CM | POA: Diagnosis not present

## 2019-12-29 DIAGNOSIS — I480 Paroxysmal atrial fibrillation: Secondary | ICD-10-CM

## 2019-12-29 DIAGNOSIS — G4733 Obstructive sleep apnea (adult) (pediatric): Secondary | ICD-10-CM

## 2019-12-29 NOTE — Assessment & Plan Note (Signed)
PSA ordered today. Denies S&S of BPH

## 2019-12-29 NOTE — Patient Instructions (Signed)
I appreciate the opportunity to provide you with care for your health and wellness. Today we discussed: overall health   Follow up: 6 months   Labs-fasting  Referrals today  I am so happy for you! Congrats on the weight loss and taking control of your health!  Please continue to practice social distancing to keep you, your family, and our community safe.  If you must go out, please wear a mask and practice good handwashing.  It was a pleasure to see you and I look forward to continuing to work together on your health and well-being. Please do not hesitate to call the office if you need care or have questions about your care.  Have a wonderful day and week. With Gratitude, Cherly Beach, DNP, AGNP-BC  HEALTH MAINTENANCE RECOMMENDATIONS:  It is recommended that you get at least 30 minutes of aerobic exercise at least 5 days/week (for weight loss, you may need as much as 60-90 minutes). This can be any activity that gets your heart rate up. This can be divided in 10-15 minute intervals if needed, but try and build up your endurance at least once a week.  Weight bearing exercise is also recommended twice weekly.  Eat a healthy diet with lots of vegetables, fruits and fiber.  "Colorful" foods have a lot of vitamins (ie green vegetables, tomatoes, red peppers, etc).  Limit sweet tea, regular sodas and alcoholic beverages, all of which has a lot of calories and sugar.  Up to 2 alcoholic drinks daily may be beneficial for men (unless trying to lose weight, watch sugars).  Drink a lot of water.  Sunscreen of at least SPF 30 should be used on all sun-exposed parts of the skin when outside between the hours of 10 am and 4 pm (not just when at beach or pool, but even with exercise, golf, tennis, and yard work!)  Use a sunscreen that says "broad spectrum" so it covers both UVA and UVB rays, and make sure to reapply every 1-2 hours.  Remember to change the batteries in your smoke detectors when changing  your clock times in the spring and fall.  Use your seat belt every time you are in a car, and please drive safely and not be distracted with cell phones and texting while driving.

## 2019-12-29 NOTE — Assessment & Plan Note (Signed)
Uses Bipap at night Doing well with this

## 2019-12-29 NOTE — Assessment & Plan Note (Signed)
William Kim is encouraged to maintain a well balanced diet that is low in salt. Controlled, continue current medication regimen.  Additionally, he is also reminded that exercise is beneficial for heart health and control of  Blood pressure. 30-60 minutes daily is recommended-walking was suggested.

## 2019-12-29 NOTE — Assessment & Plan Note (Signed)
[place] task force recommendation 

## 2019-12-29 NOTE — Assessment & Plan Note (Signed)
Followed by cardology Continue all medications as ordered

## 2019-12-29 NOTE — Progress Notes (Signed)
Health Maintenance reviewed -  Immunization History  Administered Date(s) Administered  . Influenza,inj,Quad PF,6+ Mos 04/03/2019  . PFIZER SARS-COV-2 Vaccination 09/06/2019, 09/27/2019   Last colonoscopy: 07/2018 Last PSA: ordered today  Dentist: 12/2019 Ophtho: 10/2019  Exercise: "slacked off recently due to work"  Smoker: no  Alcohol Use: no   Other doctors caring for patient include:  Patient Care Team: Perlie Mayo, NP as PCP - General (Family Medicine) Sueanne Margarita, MD as PCP - Sleep Medicine (Sleep Medicine) Gala Romney Cristopher Estimable, MD as Consulting Physician (Gastroenterology)  End of Life Discussion:  Patient does not have a living will and medical power of attorney  Subjective:   HPI  William Kim is a 54 y.o. male who presents for annual wellness visit and follow-up on chronic medical conditions.  She has the following concerns: none, is fasting for labs. He denies having any trouble sleeping.  Reports that he takes Tylenol PM and that helps.  Denies having any changes in bowel or bladder habits.  Denies having any blood in urine or stool.  Denies having any trouble chewing or swallowing.  Does have a dental appointment later today.  Denies having any vision changes.  Did get new glasses about 2 months ago.  Denies having any hearing trouble.  Denies having any skin issues.  Denies having any memory issues.  Denies having any falls.  Denies having any chest pain, shortness of breath, headaches, dizziness, palpitations, leg swelling.  Review Of Systems  Review of Systems  Constitutional: Negative.   HENT: Negative.   Eyes: Negative.   Respiratory: Negative.   Cardiovascular: Negative.   Gastrointestinal: Negative.   Endocrine: Negative.   Genitourinary: Negative.   Musculoskeletal: Negative.   Skin: Negative.   Allergic/Immunologic: Negative.   Neurological: Negative.   Hematological: Negative.   Psychiatric/Behavioral: Negative.   All other systems  reviewed and are negative.   Objective:   PHYSICAL EXAM:  BP 107/71 (BP Location: Right Arm, Patient Position: Sitting, Cuff Size: Normal)   Pulse 78   Temp (!) 97 F (36.1 C) (Temporal)   Resp 18   Ht 6' (1.829 m)   Wt 179 lb 12.8 oz (81.6 kg)   SpO2 97%   BMI 24.39 kg/m    Physical Exam Vitals and nursing note reviewed.  Constitutional:      Appearance: Normal appearance. He is well-developed, well-groomed and normal weight.  HENT:     Head: Normocephalic and atraumatic.     Right Ear: Hearing, tympanic membrane, ear canal and external ear normal.     Left Ear: Hearing, tympanic membrane, ear canal and external ear normal.     Nose: Nose normal.     Mouth/Throat:     Lips: Pink.     Mouth: Mucous membranes are moist.     Pharynx: Oropharynx is clear. Uvula midline.  Eyes:     General: Lids are normal.        Right eye: No discharge.        Left eye: No discharge.     Extraocular Movements: Extraocular movements intact.     Conjunctiva/sclera: Conjunctivae normal.     Pupils: Pupils are equal, round, and reactive to light.  Neck:     Thyroid: No thyroid mass, thyromegaly or thyroid tenderness.     Vascular: No carotid bruit.  Cardiovascular:     Rate and Rhythm: Normal rate and regular rhythm.     Pulses: Normal pulses.  Dorsalis pedis pulses are 2+ on the right side and 2+ on the left side.       Posterior tibial pulses are 2+ on the right side and 2+ on the left side.     Heart sounds: Normal heart sounds.  Pulmonary:     Effort: Pulmonary effort is normal.     Breath sounds: Normal breath sounds and air entry.  Abdominal:     General: Bowel sounds are normal. There is no distension.     Palpations: Abdomen is soft.     Tenderness: There is no abdominal tenderness. There is no right CVA tenderness or left CVA tenderness.     Hernia: No hernia is present.  Musculoskeletal:        General: Normal range of motion.     Cervical back: Normal range of  motion and neck supple.     Right lower leg: No edema.     Left lower leg: No edema.     Comments: MAE, ROM intact   Feet:     Right foot:     Skin integrity: Skin integrity normal.     Left foot:     Skin integrity: Skin integrity normal.  Lymphadenopathy:     Cervical: No cervical adenopathy.  Skin:    General: Skin is warm and dry.     Capillary Refill: Capillary refill takes less than 2 seconds.  Neurological:     General: No focal deficit present.     Mental Status: He is alert and oriented to person, place, and time.     Cranial Nerves: Cranial nerves are intact.     Sensory: Sensation is intact.     Motor: Motor function is intact.     Coordination: Coordination is intact.     Gait: Gait is intact.     Deep Tendon Reflexes: Reflexes are normal and symmetric.  Psychiatric:        Attention and Perception: Attention and perception normal.        Mood and Affect: Mood and affect normal.        Speech: Speech normal.        Behavior: Behavior normal. Behavior is cooperative.        Thought Content: Thought content normal.        Cognition and Memory: Cognition and memory normal.        Judgment: Judgment normal.     Comments: Good eye contact and communication     Depression Screening  Depression screen [facility] 2/9 12/29/2019 06/30/2019 05/31/2019  Decreased Interest 0 0 0  Down, Depressed, Hopeless 0 0 0  PHQ - 2 Score 0 0 0  Altered sleeping 0 - -  Tired, decreased energy 0 - -  Change in appetite 0 - -  Feeling bad or failure about yourself  0 - -  Trouble concentrating 0 - -  Moving slowly or fidgety/restless 0 - -  Suicidal thoughts 0 - -  PHQ-9 Score 0 - -  Difficult doing work/chores Not difficult at all - -     Falls  Fall Risk  12/29/2019 06/30/2019 05/31/2019  Falls in the past year? 0 0 0  Number falls in past yr: 0 0 0  Injury with Fall? 0 0 0  Risk for fall due to : No Fall Risks - -  Follow up Falls evaluation completed - -    Assessment & Plan:    1. Annual visit for general adult medical examination with abnormal  findings   2. Paroxysmal atrial fibrillation (HCC)   3. Other fatigue   4. Encounter for screening for malignant neoplasm of prostate   5. Encounter for hepatitis C screening test for low risk patient   6. Benign essential HTN   7. OSA (obstructive sleep apnea)     Tests ordered Orders Placed This Encounter  Procedures  . CBC  . COMPLETE METABOLIC PANEL WITH GFR  . Lipid panel  . VITAMIN D 25 Hydroxy (Vit-D Deficiency, Fractures)  . TSH  . PSA  . HEP C AB W/REFL     Plan: Please see assessment and plan per problem list above.   No orders of the defined types were placed in this encounter.   I have personally reviewed: The patient's medical and social history Their use of alcohol, tobacco or illicit drugs Their current medications and supplements The patient's functional ability including ADLs,fall risks, home safety risks, cognitive, and hearing and visual impairment Diet and physical activities Evidence for depression or mood disorders  The patient's weight, height, BMI, and visual acuity have been recorded in the chart.  I have made referrals, counseling, and provided education to the patient based on review of the above and I have provided the patient with a written personalized care plan for preventive services.    Note: This dictation was prepared with Dragon dictation along with smaller phrase technology. Similar sounding words can be transcribed inadequately or may not be corrected upon review. Any transcriptional errors that result from this process are unintentional.      Perlie Mayo, NP   12/29/2019

## 2019-12-29 NOTE — Assessment & Plan Note (Signed)
Discussed PSA screening (risks/benefits), recommended at least 30 minutes of aerobic activity at least 5 days/week; proper sunscreen use reviewed; healthy diet and alcohol recommendations (less than or equal to 2 drinks/day) reviewed; regular seatbelt use; changing batteries in smoke detectors. Immunization recommendations discussed.  Colonoscopy recommendations reviewed. ° °

## 2019-12-29 NOTE — Assessment & Plan Note (Signed)
On iron, getting labs today

## 2019-12-30 ENCOUNTER — Encounter: Payer: Self-pay | Admitting: Family Medicine

## 2019-12-30 LAB — LIPID PANEL
Cholesterol: 160 mg/dL (ref <200)
HDL: 46 mg/dL (ref >=40)
LDL Cholesterol (Calc): 96 mg/dL (calc)
Non-HDL Cholesterol (Calc): 114 mg/dL (calc) (ref <130)
Total CHOL/HDL Ratio: 3.5 (calc) (ref <5.0)
Triglycerides: 85 mg/dL (ref <150)

## 2019-12-30 LAB — CBC
HCT: 41.7 % (ref 38.5–50.0)
Hemoglobin: 13.9 g/dL (ref 13.2–17.1)
MCH: 30.3 pg (ref 27.0–33.0)
MCHC: 33.3 g/dL (ref 32.0–36.0)
MCV: 91 fL (ref 80.0–100.0)
MPV: 10.6 fL (ref 7.5–12.5)
Platelets: 209 10*3/uL (ref 140–400)
RBC: 4.58 10*6/uL (ref 4.20–5.80)
RDW: 14.9 % (ref 11.0–15.0)
WBC: 4.9 10*3/uL (ref 3.8–10.8)

## 2019-12-30 LAB — COMPLETE METABOLIC PANEL WITH GFR
AG Ratio: 2 (calc) (ref 1.0–2.5)
ALT: 47 U/L — ABNORMAL HIGH (ref 9–46)
AST: 36 U/L — ABNORMAL HIGH (ref 10–35)
Albumin: 4.9 g/dL (ref 3.6–5.1)
Alkaline phosphatase (APISO): 88 U/L (ref 35–144)
BUN: 20 mg/dL (ref 7–25)
CO2: 31 mmol/L (ref 20–32)
Calcium: 9.7 mg/dL (ref 8.6–10.3)
Chloride: 107 mmol/L (ref 98–110)
Creat: 1 mg/dL (ref 0.70–1.33)
GFR, Est African American: 100 mL/min/{1.73_m2} (ref >=60)
GFR, Est Non African American: 86 mL/min/{1.73_m2} (ref >=60)
Globulin: 2.4 g/dL (calc) (ref 1.9–3.7)
Glucose, Bld: 94 mg/dL (ref 65–99)
Potassium: 4.2 mmol/L (ref 3.5–5.3)
Sodium: 143 mmol/L (ref 135–146)
Total Bilirubin: 1 mg/dL (ref 0.2–1.2)
Total Protein: 7.3 g/dL (ref 6.1–8.1)

## 2019-12-30 LAB — PSA: PSA: 0.7 ng/mL (ref <=4.0)

## 2019-12-30 LAB — REFLEX TIQ

## 2019-12-30 LAB — HEP C AB W/REFL
HEPATITIS C ANTIBODY REFILL$(REFL): NONREACTIVE
SIGNAL TO CUT-OFF: 0.02 (ref <1.00)

## 2019-12-30 LAB — TSH: TSH: 0.86 mIU/L (ref 0.40–4.50)

## 2019-12-30 LAB — VITAMIN D 25 HYDROXY (VIT D DEFICIENCY, FRACTURES): Vit D, 25-Hydroxy: 46 ng/mL (ref 30–100)

## 2020-01-19 ENCOUNTER — Telehealth: Payer: Self-pay | Admitting: Cardiology

## 2020-01-19 NOTE — Telephone Encounter (Signed)
Patient called and said the pressure on his machine is too high. He wakes up and has to take the mask off and is often burping air.  Please adjust pressure

## 2020-01-19 NOTE — Addendum Note (Signed)
Addended by: Freada Bergeron on: 01/19/2020 12:01 PM   Modules accepted: Orders

## 2020-01-19 NOTE — Telephone Encounter (Signed)
Change to Auto BiPAP with IPAP mas 20cm H2O, EPAP min 6cm H2O and PS 4cm H2O and get a download in 2 weeks

## 2020-01-23 ENCOUNTER — Telehealth: Payer: Self-pay | Admitting: *Deleted

## 2020-01-23 DIAGNOSIS — G4733 Obstructive sleep apnea (adult) (pediatric): Secondary | ICD-10-CM

## 2020-01-23 NOTE — Telephone Encounter (Signed)
Patient agrees that his mask is leaking because his machine tells him he has a leak. Patient has NOT been changing his cushions monthly but had been washing them even though he had extras. He says he didn't know but he will start changing them.  I reached out to [facility] (susanne) and ask them to make an appointment for the patient to look at his mask. Patient uses a full face medium sized mask.

## 2020-01-23 NOTE — Telephone Encounter (Signed)
-----   Message from Sueanne Margarita, MD sent at 01/20/2020  4:11 PM EDT ----- Regarding: RE: BIPAP PRESSURE TOO STRONG He is on Auto BiPAP and looks like he has a large mask leak which is likely pushing his pressure up at night.  Please get him an appt with DME to look at his mask.  Please find out if he uses a nasal or full face mask  Traci ----- Message ----- From: Freada Bergeron, CMA Sent: 01/19/2020  10:48 AM EDT To: Sueanne Margarita, MD Subject: BIPAP PRESSURE TOO STRONG                      Patient states his pressure too strong and he can't sleep, he is choking and gagging. Please advise

## 2020-01-24 NOTE — Telephone Encounter (Signed)
Order placed to [facility].

## 2020-01-24 NOTE — Telephone Encounter (Signed)
Per Dr Radford Pax, Change to Auto BiPAP with IPAP mas 20cm H2O, EPAP min 6cm H2O and PS 4cm H2O and get a download in 2 weeks. Per Dr Radford Pax order placed to [facility]

## 2020-01-27 NOTE — Telephone Encounter (Signed)
Follow up   Pt says he had a call from [place] and the pressure was not lowered  He would like for someone to call him   Please call

## 2020-01-31 NOTE — Telephone Encounter (Signed)
Reached out to patient to assist him and he states he wants to try one more night on the new setting to see if it makes a difference. He will call me tomorrow.

## 2020-02-14 NOTE — Telephone Encounter (Signed)
Addressed. See 01/23/20 Telephone Encounter

## 2020-02-15 ENCOUNTER — Telehealth: Payer: Self-pay | Admitting: *Deleted

## 2020-02-15 DIAGNOSIS — G4733 Obstructive sleep apnea (adult) (pediatric): Secondary | ICD-10-CM

## 2020-02-15 NOTE — Addendum Note (Signed)
Addended by: Freada Bergeron on: 02/15/2020 03:43 PM   Modules accepted: Orders

## 2020-02-15 NOTE — Telephone Encounter (Signed)
Patient called for Rx for a large F20 mask with his supplies. Reached out to to supplies at [facility] and they said to send over the Rx and it will go out in September with his supplies.

## 2020-02-15 NOTE — Telephone Encounter (Addendum)
Informed patient of compliance results and verbalized understanding was indicated. Patient is aware and agreeable to AHI being within range at 6.3. Patient is aware and agreeable to improving in compliance with machine usage. Patient is aware and agreeable to no change in current pressures.

## 2020-02-15 NOTE — Telephone Encounter (Signed)
-----   Message from Sueanne Margarita, MD sent at 02/15/2020 12:20 PM EDT ----- Regarding: RE: 2 week d/l AHI much improved on auto BIAPAP.  Encouraged increased compliance  Traci ----- Message ----- From: Freada Bergeron, CMA Sent: 02/14/2020  12:28 PM EDT To: Sueanne Margarita, MD Subject: 2 week d/l                                     Per my last note  2 week download on patient ,please advise if new setting are needed. Thank you.  BUSH, MURDOCH 01/16/2020 - 02/14/2020 Patient ID: [id number] DOB: 1966-04-27 Age: 54 years [place] Compliance Report Usage 01/16/2020 - 02/14/2020 Usage days 28/30 days (93%) >= 4 hours 19 days (63%) < 4 hours 9 days (30%) Usage hours 131 hours 12 minutes Average usage (total days) 4 hours 22 minutes Average usage (days used) 4 hours 41 minutes Median usage (days used) 4 hours 42 minutes Total used hours (value since last reset - 02/14/2020) 2,680 hours AirCurve 10 VAuto Serial number [id number] Mode VAuto Max IPAP 20 cmH2O Min EPAP 6 cmH2O Pressure Support 4 cmH2O Therapy Leaks - L/min Median: 8.3 95th percentile: 17.8 Maximum: 41.4 Events per hour AI: 5.8 HI: 0.5 AHI: 6.3 Apnea Index Central: 2.2 Obstructive: 2.8 Unknown: 0.7 Usage - hours Printed on 02/14/2020 - ResMed AirView version 4.26.0-2.0 Page 1 of 1

## 2020-03-02 ENCOUNTER — Ambulatory Visit
Admission: EM | Admit: 2020-03-02 | Discharge: 2020-03-02 | Disposition: A | Discharge status: Home / Self Care | Payer: 59 | Attending: Emergency Medicine | Admitting: Emergency Medicine

## 2020-03-02 DIAGNOSIS — M545 Low back pain, unspecified: Secondary | ICD-10-CM

## 2020-03-02 MED ORDER — CYCLOBENZAPRINE HCL 10 MG PO TABS
10.0000 mg | ORAL_TABLET | Freq: Every day | ORAL | 0 refills | Status: DC
Start: 2020-03-02 — End: 2020-06-12

## 2020-03-02 MED ORDER — NAPROXEN 500 MG PO TABS
500.0000 mg | ORAL_TABLET | Freq: Two times a day (BID) | ORAL | 0 refills | Status: DC
Start: 2020-03-02 — End: 2020-06-12
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
## 2020-03-02 NOTE — Discharge Instructions (Signed)
Rest, ice and heat as needed Ensure adequate range of motion as tolerated. Injuries all appear to be muscular in nature at this time Prescribed naproxen as needed for inflammation and pain relief.  DO NOT TAKE WITH OTHER antiinflammatories, as this may cause GI upset and/or bleed Prescribed flexeril as needed at bedtime for muscle spasm.  Do not drive or operate heavy machinery while taking this medication Expect some increased pain in the next 1-3 days.  It may take 3-4 weeks for complete resolution of symptoms Will f/u with his doctor or here if not seeing significant improvement within one week. Return here or go to ER if you have any new or worsening symptoms such as numbness/tingling of the inner thighs, loss of bladder or bowel control, headache/blurry vision, nausea/vomiting, confusion/altered mental status, dizziness, weakness, passing out, imbalance, etc..Marland Kitchen

## 2020-06-12 ENCOUNTER — Encounter: Payer: Self-pay | Admitting: Family Medicine

## 2020-06-12 ENCOUNTER — Other Ambulatory Visit: Payer: Self-pay

## 2020-06-12 ENCOUNTER — Ambulatory Visit (HOSPITAL_COMMUNITY)
Admission: RE | Admit: 2020-06-12 | Discharge: 2020-06-12 | Disposition: A | Discharge status: Home / Self Care | Payer: 59 | Source: Ambulatory Visit | Attending: Nurse Practitioner | Admitting: Nurse Practitioner

## 2020-06-12 ENCOUNTER — Encounter (HOSPITAL_COMMUNITY): Payer: Self-pay | Admitting: Nurse Practitioner

## 2020-06-12 VITALS — BP 154/102 | HR 83 | Ht 72.0 in | Wt 201.8 lb

## 2020-06-12 DIAGNOSIS — Z7982 Long term (current) use of aspirin: Secondary | ICD-10-CM | POA: Diagnosis not present

## 2020-06-12 DIAGNOSIS — I10 Essential (primary) hypertension: Secondary | ICD-10-CM | POA: Insufficient documentation

## 2020-06-12 DIAGNOSIS — I480 Paroxysmal atrial fibrillation: Secondary | ICD-10-CM | POA: Diagnosis not present

## 2020-06-12 DIAGNOSIS — N529 Male erectile dysfunction, unspecified: Secondary | ICD-10-CM

## 2020-06-12 DIAGNOSIS — F419 Anxiety disorder, unspecified: Secondary | ICD-10-CM | POA: Insufficient documentation

## 2020-06-12 MED ORDER — SILDENAFIL CITRATE 100 MG PO TABS: 100.0000 mg | ORAL_TABLET | Freq: Every day | ORAL | 0 refills | Status: DC | PRN

## 2020-06-12 NOTE — Progress Notes (Signed)
Primary Care Physician: Perlie Mayo, NP Referring Physician: [facility] ER f/u   William Kim is a 54 y.o. male with a h/o HTN, alcohol use and untreated probable sleep apnea. It is documented in Epic in 2014 that he was treated for afib in the ER. The pt reports that he would have issues with irregular heart beat off and on since then but episodes would be short lived. He noticed the episodes were more frequent over the last few weeks and recently present to the ER, 7/31, with afib with RVR.He was given IV diltiazem and converted. Discharged home on xarelto 20 mg for a chadsvasc score of 1 and daily diltiazem 120 mg a day.  In the afib clinic, 8/3, He is in [place]. No further afib. He was drinking 6 alcoholic drinks a night and was told in the ER to diminish alcohol intake. He also reports significant snoring/ apnea and about 8 years ago, had a sleep study but when the cpap mask was applied, he did not like the feeling and left the test. 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 He denies tobacco or caffeine use.  F/u in afib clinic 8/27. He is in [place]. Has noted a few short lived episodes of irregular heart beat. He will finish xarelto for a chadsvasc score of 1(htn). Echo showed Mild LVH. He has tolerated addition of diltiazem daily. Sleep study is pending. He has cut back on alcohol but has not had cessation.  F/u afib clinic, he had repeated sleep study and is pending results of test. Still  notices some episodes of afib lasting around 15-20 mins. Still has cut down on alcohol but two nights ago had 2 beers and a margarita. He is exercising more and has lost 4 lbs. He is now off anticoagulation.  F/u in afib clinic, 05/15/17. He continues with alcohol abuse, drinking a beer and a margarita every night. He has been found to have sleep apnea and is pending getting his CPAP. He notices some irregular heart rate intermittently but it sounds more consistent with premature contractions.   F/u in afib  clinic, 06/13/19. Was referred back from PCP to refill diltiazem. I have not seen since 2018.  Pt has been struggling for several months of anxiety/panic attacks and has been to the ER 2x in November with feeling of heart racing, impending doom.  He states that he had shoulder surgery in June and has had more time at the house, being on short term disability, watching TV, with the constant issues with covid reporting, contributing to anxiety. He is not due to go back to work until after 1//21. When he has his attacks he is aware of impending doom, heart racing. No ekg's in the ER showed afib. I do not see any evidence of afib since I last saw pt. He has also lost from 230 to 180 lb, which he contributes to anxiety decreasing his appetite. He saw his PCP recently and was referred to behavioral health. He has not heard back re appointment date yet. He was given hydroxyzine 25 mg  to use when necessary.   F/u in afib clinic, 06/12/20. He has had a good year without any appreciable afib. He was placed on Wellbutrin last year with his anxiety now  under good control. He is trying to watch what he eats and exercise on his treadmill several times a week. BP elevated today, states yesterday at [facility] visit, it was in range.  Today,  he denies symptoms of palpitations, chest pain, shortness of breath, orthopnea, PND, lower extremity edema, dizziness, presyncope, syncope, or neurologic sequela. The patient is tolerating medications without difficulties and is otherwise without complaint today.   Past Medical History:  Diagnosis Date  . A-fib ([place])   . Anemia    Phreesia 12/26/2019  . Anxiety   . Cervicalgia   . Encounter for screening colonoscopy 07/02/2018  . High cholesterol   . Hypertension   . Obesity (BMI 30-39.9) 05/31/2018  . OSA (obstructive sleep apnea) 05/31/2018   Severe OSA with AHI 79/h with significant oxygen desaturations as low as 58%.   Past Surgical History:  Procedure Laterality Date  .  COLONOSCOPY WITH PROPOFOL N/A 08/02/2018   Procedure: COLONOSCOPY WITH PROPOFOL;  Surgeon: Daneil Dolin, MD;  [facility];  Service: Endoscopy;  Laterality: N/A;  12:15pm  . NERVE SURGERY    . None      Current Outpatient Medications  Medication Sig Dispense Refill  . Ascorbic Acid (VITAMIN C PO) Take 1 tablet by mouth daily.    Marland Kitchen aspirin EC 81 MG tablet Take 81 mg by mouth daily.    Marland Kitchen buPROPion (WELLBUTRIN XL) 300 MG 24 hr tablet Take 300 mg by mouth daily.    Marland Kitchen diltiazem (CARDIZEM CD) 120 MG 24 hr capsule Take 1 capsule (120 mg total) by mouth at bedtime. Takes 180mg  in the morning 90 capsule 3  . diltiazem (CARTIA XT) 180 MG 24 hr capsule Take 1 capsule (180 mg total) by mouth every morning. 90 capsule 3  . fluticasone (FLONASE) 50 MCG/ACT nasal spray Place 2 sprays into both nostrils daily. (Patient taking differently: Place 2 sprays into both nostrils as needed.) 16 g 0  . sildenafil (VIAGRA) 100 MG tablet Take 1 tablet (100 mg total) by mouth daily as needed. 10 tablet 0  . simvastatin (ZOCOR) 20 MG tablet Take 20 mg by mouth at bedtime.    . valACYclovir (VALTREX) 500 MG tablet Take 1 tablet by mouth daily.     No current facility-administered medications for this encounter.    No Known Allergies  Social History   Socioeconomic History  . Marital status: Married    Spouse name: Archie Patten   . Number of children: 2  . Years of education: Not on file  . Highest education level: 12th grade  Occupational History  . Not on file  Tobacco Use  . Smoking status: Former Smoker    Types: Cigarettes  . Smokeless tobacco: Never Used  . Tobacco comment: quit smoking in 2012/2013, quit smoking August 16th  Vaping Use  . Vaping Use: Never used  Substance and Sexual Activity  . Alcohol use: Not Currently    Comment: stopped 3-4 months ago  . Drug use: No  . Sexual activity: Not on file  Other Topics Concern  . Not on file  Social History Narrative   Lives with wife  Archie Patten married 3/10 46 years   2 children   Son 27 lives in [place], 1 grandbaby 62 months old 2020   Daughter 32 she is living at home      Enjoys fishing      Diet: eats veggies and salmon, avoid red meats   Caffeine: does not drink    Water: 2 bottles day       Wear seat belt    Smoke detectors   Does not use phone while driving    Social Determinants of Health  Financial Resource Strain: Not on file  Food Insecurity: Not on file  Transportation Needs: Not on file  Physical Activity: Not on file  Stress: Not on file  Social Connections: Not on file  Intimate Partner Violence: Not on file    Family History  Problem Relation Age of Onset  . Healthy Mother   . Healthy Father   . Colon cancer Neg Hx   . Colon polyps Neg Hx     ROS- All systems are reviewed and negative except as per the HPI above  Physical Exam: Vitals:   06/12/20 1522  BP: (!) 154/102  Pulse: 83  Weight: 91.5 kg  Height: 6' (1.829 m)   Wt Readings from Last 3 Encounters:  06/12/20 91.5 kg  12/29/19 81.6 kg  11/19/19 81.6 kg    Labs: Lab Results  Component Value Date   NA 143 12/29/2019   K 4.2 12/29/2019   CL 107 12/29/2019   CO2 31 12/29/2019   GLUCOSE 94 12/29/2019   BUN 20 12/29/2019   CREATININE 1.00 12/29/2019   CALCIUM 9.7 12/29/2019   No results found for: INR Lab Results  Component Value Date   CHOL 160 12/29/2019   HDL 46 12/29/2019   LDLCALC 96 12/29/2019   TRIG 85 12/29/2019     GEN- The patient is well appearing, alert and oriented x 3 today.   Head- normocephalic, atraumatic Eyes-  Sclera clear, conjunctiva pink Ears- hearing intact Oropharynx- clear Neck- supple, no JVP Lymph- no cervical lymphadenopathy Lungs- Clear to ausculation bilaterally, normal work of breathing Heart- Regular rate and rhythm, no murmurs, rubs or gallops, PMI not laterally displaced GI- soft, NT, ND, + BS Extremities- no clubbing, cyanosis, or edema MS- no significant deformity or  atrophy Skin- no rash or lesion Psych- euthymic mood, full affect Neuro- strength and sensation are intact  EKG- NSR at 83  Bpm,  pr int 186 ms, qrs in 80 ms, qtc 395  ms Epic records reviewed Echo- 2018-Study Conclusions  - Left ventricle: The cavity size was normal. Wall thickness was   increased in a pattern of mild LVH. Systolic function was normal.   The estimated ejection fraction was in the range of 60% to 65%.   Low normal GLPSS at -18%. Wall motion was normal; there were no   regional wall motion abnormalities. Doppler parameters are   consistent with abnormal left ventricular relaxation (grade 1   diastolic dysfunction). The E/e&' ratio is between 8-15,   suggesting indeterminate LV filling pressure. - Mitral valve: Mildly thickened leaflets . There was trivial   regurgitation. - Left atrium: The atrium was normal in size. - Inferior vena cava: The vessel was normal in size. The   respirophasic diameter changes were in the normal range (>= 50%),   consistent with normal central venous pressure.  Impressions:  - LVEF 60-65%, mild LVH, normal wall thickness, normal wall motion,   grade 1 DD, indeterminate LV filling pressure, normal LA Size,   normal IVC.   Assessment and Plan: 1. Paroxysmal afib I have seen no evidence of afib since 2018 No appreciation of heart racing for the last year  Continue diltiazem 180 mg am and 120 mg pm   Off xarelto, with a chadsvasc score of 1, continue with  81 mg ASA  2. HTN Elevated on presentation States usually in good range  He  will check later at home    3. Lifestyle measures  No alcohol use  He is  trying to eat better at this time, less salt, healthy choices Encouraged  to maintain healthy weight    4. Anxiety Pt had been struggling with this Much better after being on wellbutrin  Will see back in one year  Butch Penny C. Karla Pavone, [facility] [address] [place], [place]  [id number] [phone]

## 2020-06-29 ENCOUNTER — Encounter: Payer: Self-pay | Admitting: Family Medicine

## 2020-07-03 ENCOUNTER — Telehealth: Payer: 59 | Admitting: Family Medicine

## 2020-07-05 ENCOUNTER — Other Ambulatory Visit: Payer: Self-pay

## 2020-07-05 ENCOUNTER — Encounter: Payer: Self-pay | Admitting: Family Medicine

## 2020-07-05 ENCOUNTER — Telehealth (INDEPENDENT_AMBULATORY_CARE_PROVIDER_SITE_OTHER): Payer: 59 | Admitting: Family Medicine

## 2020-07-05 VITALS — Ht 72.0 in | Wt 205.0 lb

## 2020-07-05 DIAGNOSIS — R351 Nocturia: Secondary | ICD-10-CM | POA: Insufficient documentation

## 2020-07-05 DIAGNOSIS — D229 Melanocytic nevi, unspecified: Secondary | ICD-10-CM | POA: Insufficient documentation

## 2020-07-05 DIAGNOSIS — I10 Essential (primary) hypertension: Secondary | ICD-10-CM

## 2020-07-05 NOTE — Assessment & Plan Note (Signed)
Controlled, continue medication as directed DASH diet and exercise encouraged

## 2020-07-05 NOTE — Patient Instructions (Addendum)
I appreciate the opportunity to provide you with care for your health and wellness.  Follow up: July for CPE -fasting for labs same day  No labs Referrals today: urologist and dermatologist  Please continue to practice social distancing to keep you, your family, and our community safe.  If you must go out, please wear a mask and practice good handwashing.  It was a pleasure to see you and I look forward to continuing to work together on your health and well-being. Please do not hesitate to call the office if you need care or have questions about your care.  Have a wonderful day. With Gratitude, Tereasa Coop, DNP, AGNP-BC    Heart-Healthy Eating Plan Many factors influence your heart (coronary) health, including eating and exercise habits. Coronary risk increases with abnormal blood fat (lipid) levels. Heart-healthy meal planning includes limiting unhealthy fats, increasing healthy fats, and making other diet and lifestyle changes.  What are tips for following this plan? Cooking Cook foods using methods other than frying. Baking, boiling, grilling, and broiling are all good options. Other ways to reduce fat include:  Removing the skin from poultry.  Removing all visible fats from meats.  Steaming vegetables in water or broth. Meal planning   At meals, imagine dividing your plate into fourths: ? Fill one-half of your plate with vegetables and green salads. ? Fill one-fourth of your plate with whole grains. ? Fill one-fourth of your plate with lean protein foods.  Eat 4-5 servings of vegetables per day. One serving equals 1 cup raw or cooked vegetable, or 2 cups raw leafy greens.  Eat 4-5 servings of fruit per day. One serving equals 1 medium whole fruit,  cup dried fruit,  cup fresh, frozen, or canned fruit, or  cup 100% fruit juice.  Eat more foods that contain soluble fiber. Examples include apples, broccoli, carrots, beans, peas, and barley. Aim to get 25-30 g of  fiber per day.  Increase your consumption of legumes, nuts, and seeds to 4-5 servings per week. One serving of dried beans or legumes equals  cup cooked, 1 serving of nuts is  cup, and 1 serving of seeds equals 1 tablespoon. Fats  Choose healthy fats more often. Choose monounsaturated and polyunsaturated fats, such as olive and canola oils, flaxseeds, walnuts, almonds, and seeds.  Eat more omega-3 fats. Choose salmon, mackerel, sardines, tuna, flaxseed oil, and ground flaxseeds. Aim to eat fish at least 2 times each week.  Check food labels carefully to identify foods with trans fats or high amounts of saturated fat.  Limit saturated fats. These are found in animal products, such as meats, butter, and cream. Plant sources of saturated fats include palm oil, palm kernel oil, and coconut oil.  Avoid foods with partially hydrogenated oils in them. These contain trans fats. Examples are stick margarine, some tub margarines, cookies, crackers, and other baked goods.  Avoid fried foods. General information  Eat more home-cooked food and less restaurant, buffet, and fast food.  Limit or avoid alcohol.  Limit foods that are high in starch and sugar.  Lose weight if you are overweight. Losing just 5-10% of your body weight can help your overall health and prevent diseases such as diabetes and heart disease.  Monitor your salt (sodium) intake, especially if you have high blood pressure. Talk with your health care provider about your sodium intake.  Try to incorporate more vegetarian meals weekly. What foods can I eat? Fruits All fresh, canned (in natural juice), or frozen  fruits. Vegetables Fresh or frozen vegetables (raw, steamed, roasted, or grilled). Green salads. Grains Most grains. Choose whole wheat and whole grains most of the time. Rice and pasta, including brown rice and pastas made with whole wheat. Meats and other proteins Lean, well-trimmed beef, veal, pork, and lamb. Chicken  and [place] without skin. All fish and shellfish. Wild duck, rabbit, pheasant, and venison. Egg whites or low-cholesterol egg substitutes. Dried beans, peas, lentils, and tofu. Seeds and most nuts. Dairy Low-fat or nonfat cheeses, including ricotta and mozzarella. Skim or 1% milk (liquid, powdered, or evaporated). Buttermilk made with low-fat milk. Nonfat or low-fat yogurt. Fats and oils Non-hydrogenated (trans-free) margarines. Vegetable oils, including soybean, sesame, sunflower, olive, peanut, safflower, corn, canola, and cottonseed. Salad dressings or mayonnaise made with a vegetable oil. Beverages Water (mineral or sparkling). Coffee and tea. Diet carbonated beverages. Sweets and desserts Sherbet, gelatin, and fruit ice. Small amounts of dark chocolate. Limit all sweets and desserts. Seasonings and condiments All seasonings and condiments. The items listed above may not be a complete list of foods and beverages you can eat. Contact a dietitian for more options. What foods are not recommended? Fruits Canned fruit in heavy syrup. Fruit in cream or butter sauce. Fried fruit. Limit coconut. Vegetables Vegetables cooked in cheese, cream, or butter sauce. Fried vegetables. Grains Breads made with saturated or trans fats, oils, or whole milk. Croissants. Sweet rolls. Donuts. High-fat crackers, such as cheese crackers. Meats and other proteins Fatty meats, such as hot dogs, ribs, sausage, bacon, rib-eye roast or steak. High-fat deli meats, such as salami and bologna. Caviar. Domestic duck and goose. Organ meats, such as liver. Dairy Cream, sour cream, cream cheese, and creamed cottage cheese. Whole milk cheeses. Whole or 2% milk (liquid, evaporated, or condensed). Whole buttermilk. Cream sauce or high-fat cheese sauce. Whole-milk yogurt. Fats and oils Meat fat, or shortening. Cocoa butter, hydrogenated oils, palm oil, coconut oil, palm kernel oil. Solid fats and shortenings, including bacon fat,  salt pork, lard, and butter. Nondairy cream substitutes. Salad dressings with cheese or sour cream. Beverages Regular sodas and any drinks with added sugar. Sweets and desserts Frosting. Pudding. Cookies. Cakes. Pies. Milk chocolate or white chocolate. Buttered syrups. Full-fat ice cream or ice cream drinks. The items listed above may not be a complete list of foods and beverages to avoid. Contact a dietitian for more information. Summary  Heart-healthy meal planning includes limiting unhealthy fats, increasing healthy fats, and making other diet and lifestyle changes.  Lose weight if you are overweight. Losing just 5-10% of your body weight can help your overall health and prevent diseases such as diabetes and heart disease.  Focus on eating a balance of foods, including fruits and vegetables, low-fat or nonfat dairy, lean protein, nuts and legumes, whole grains, and heart-healthy oils and fats. This information is not intended to replace advice given to you by your health care provider. Make sure you discuss any questions you have with your health care provider. Document Revised: 07/24/2017 Document Reviewed: 07/24/2017 Elsevier Patient Education  2020 [employer].

## 2020-07-05 NOTE — Assessment & Plan Note (Signed)
Unable to assess with limited PE Derm referral placed

## 2020-07-05 NOTE — Assessment & Plan Note (Signed)
Increased urination- might be prostate related, request urologist for this, referral was placed

## 2020-07-05 NOTE — Progress Notes (Signed)
Virtual Visit via Telephone Note   This visit type was conducted due to national recommendations for restrictions regarding the COVID-19 Pandemic (e.g. social distancing) in an effort to limit this patient's exposure and mitigate transmission in our community.  Due to his co-morbid illnesses, this patient is at least at moderate risk for complications without adequate follow up.  This format is felt to be most appropriate for this patient at this time.  The patient did not have access to video technology/had technical difficulties with video requiring transitioning to audio format only (telephone).  All issues noted in this document were discussed and addressed.  No physical exam could be performed with this format.    Evaluation Performed:  Follow-up visit  Date:  07/05/2020   ID:  William Kim, DOB 05/04/1966, MRN [id number]  Patient Location: Home Provider Location: Office/Clinic   Participants: Nurse for intake and work up; Patient and Provider for Visit and Wrap up  Method of visit: Telephone  Location of Patient: Home Location of Provider: Office Consent was obtain for visit over the telephone. Services rendered by provider: Visit was performed via telephone  I verified that I am speaking with the correct person using two identifiers.  PCP:  Perlie Mayo, NP   Chief Complaint:  BP follow up   History of Present Illness:    William Kim is a 55 y.o. male with history as stated below. Presents for phone follow up on BP. Overall reports doing well. Does want referral for a mile on his back to make sure it is not changed. As well as report trouble with increased urination at night and pain in the prostate area. Denies blood or pain with urination.  Would like to see a urologist about this.  Otherwise he is doing well. BP reading today: 120/72 71 HR and Wt was 203 pounds.  He denies chest pain, shortness of breath, cough, headaches, dizziness or leg  swelling.  The patient does not have symptoms concerning for COVID-19 infection (fever, chills, cough, or new shortness of breath).   Past Medical, Surgical, Social History, Allergies, and Medications have been Reviewed.  Past Medical History:  Diagnosis Date  . A-fib ([place])   . Anemia    Phreesia 12/26/2019  . Anxiety   . Cervicalgia   . Encounter for screening colonoscopy 07/02/2018  . High cholesterol   . Hypertension   . Obesity (BMI 30-39.9) 05/31/2018  . OSA (obstructive sleep apnea) 05/31/2018   Severe OSA with AHI 79/h with significant oxygen desaturations as low as 58%.   Past Surgical History:  Procedure Laterality Date  . COLONOSCOPY WITH PROPOFOL N/A 08/02/2018   Procedure: COLONOSCOPY WITH PROPOFOL;  Surgeon: Daneil Dolin, MD;  [facility];  Service: Endoscopy;  Laterality: N/A;  12:15pm  . NERVE SURGERY    . None       Current Meds  Medication Sig  . Ascorbic Acid (VITAMIN C PO) Take 1 tablet by mouth daily.  Marland Kitchen aspirin EC 81 MG tablet Take 81 mg by mouth daily.  Marland Kitchen buPROPion (WELLBUTRIN XL) 300 MG 24 hr tablet Take 300 mg by mouth daily.  Marland Kitchen diltiazem (CARDIZEM CD) 120 MG 24 hr capsule Take 1 capsule (120 mg total) by mouth at bedtime. Takes 180mg  in the morning  . diltiazem (CARTIA XT) 180 MG 24 hr capsule Take 1 capsule (180 mg total) by mouth every morning.  . fluticasone (FLONASE) 50 MCG/ACT nasal spray Place 2  sprays into both nostrils daily. (Patient taking differently: Place 2 sprays into both nostrils as needed.)  . sildenafil (VIAGRA) 100 MG tablet Take 1 tablet (100 mg total) by mouth daily as needed.  . simvastatin (ZOCOR) 20 MG tablet Take 20 mg by mouth at bedtime.  . valACYclovir (VALTREX) 500 MG tablet Take 1 tablet by mouth daily.     Allergies:   Patient has no known allergies.   ROS:   Please see the history of present illness.    All other systems reviewed and are negative.   Labs/Other Tests and Data Reviewed:    Recent  Labs: 12/29/2019: ALT 47; BUN 20; Creat 1.00; Hemoglobin 13.9; Platelets 209; Potassium 4.2; Sodium 143; TSH 0.86   Recent Lipid Panel Lab Results  Component Value Date/Time   CHOL 160 12/29/2019 09:10 AM   TRIG 85 12/29/2019 09:10 AM   HDL 46 12/29/2019 09:10 AM   CHOLHDL 3.5 12/29/2019 09:10 AM   LDLCALC 96 12/29/2019 09:10 AM    Wt Readings from Last 3 Encounters:  07/05/20 205 lb (93 kg)  06/12/20 201 lb 12.8 oz (91.5 kg)  12/29/19 179 lb 12.8 oz (81.6 kg)     Objective:    Vital Signs:  Ht 6' (1.829 m)   Wt 205 lb (93 kg)   BMI 27.80 kg/m    VITAL SIGNS:  reviewed GEN:  no acute distress RESPIRATORY:  no shortness of breath in conversation  PSYCH:  normal affect  ASSESSMENT & PLAN:    1. Benign essential HTN   2. Skin mole  - Ambulatory referral to Dermatology  3. Urination, excessive at night  - Ambulatory referral to Urology   Time:   Today, I have spent 10 minutes with the patient with telehealth technology discussing the above problems.     Medication Adjustments/Labs and Tests Ordered: Current medicines are reviewed at length with the patient today.  Concerns regarding medicines are outlined above.   Tests Ordered: No orders of the defined types were placed in this encounter.   Medication Changes: No orders of the defined types were placed in this encounter.    Disposition:  Follow up July for CPE  Signed, Freddy Finner, NP  07/05/2020 4:24 PM     [facility]

## 2020-07-28 ENCOUNTER — Other Ambulatory Visit: Payer: Self-pay | Admitting: Family Medicine

## 2020-07-28 DIAGNOSIS — N529 Male erectile dysfunction, unspecified: Secondary | ICD-10-CM

## 2020-08-09 ENCOUNTER — Other Ambulatory Visit (HOSPITAL_COMMUNITY): Payer: Self-pay | Admitting: Nurse Practitioner

## 2020-08-12 ENCOUNTER — Encounter: Payer: Self-pay | Admitting: Family Medicine

## 2020-08-13 ENCOUNTER — Other Ambulatory Visit: Payer: Self-pay | Admitting: *Deleted

## 2020-08-13 MED ORDER — VALACYCLOVIR HCL 500 MG PO TABS: 500.0000 mg | ORAL_TABLET | Freq: Every day | ORAL | 0 refills | Status: DC

## 2020-08-24 ENCOUNTER — Encounter: Payer: Self-pay | Admitting: Urology

## 2020-08-24 ENCOUNTER — Other Ambulatory Visit: Payer: Self-pay

## 2020-08-24 ENCOUNTER — Ambulatory Visit (INDEPENDENT_AMBULATORY_CARE_PROVIDER_SITE_OTHER): Payer: 59 | Admitting: Urology

## 2020-08-24 VITALS — BP 134/80 | HR 88 | Temp 98.5°F | Ht 72.0 in | Wt 205.0 lb

## 2020-08-24 DIAGNOSIS — N401 Enlarged prostate with lower urinary tract symptoms: Secondary | ICD-10-CM | POA: Diagnosis not present

## 2020-08-24 DIAGNOSIS — N138 Other obstructive and reflux uropathy: Secondary | ICD-10-CM | POA: Diagnosis not present

## 2020-08-24 DIAGNOSIS — R351 Nocturia: Secondary | ICD-10-CM | POA: Diagnosis not present

## 2020-08-24 LAB — URINALYSIS, ROUTINE W REFLEX MICROSCOPIC
Bilirubin, UA: NEGATIVE
Glucose, UA: NEGATIVE
Ketones, UA: NEGATIVE
Leukocytes,UA: NEGATIVE
Nitrite, UA: NEGATIVE
Protein,UA: NEGATIVE
RBC, UA: NEGATIVE
Specific Gravity, UA: 1.02 (ref 1.005–1.030)
Urobilinogen, Ur: 0.2 mg/dL (ref 0.2–1.0)
pH, UA: 6 (ref 5.0–7.5)

## 2020-08-24 LAB — BLADDER SCAN AMB NON-IMAGING: Scan Result: 48

## 2020-08-24 MED ORDER — ALFUZOSIN HCL ER 10 MG PO TB24: 10.0000 mg | ORAL_TABLET | Freq: Every day | ORAL | 11 refills | Status: DC

## 2020-08-24 NOTE — Progress Notes (Signed)
Bladder Scan Patient can void: 48 ml Performed By: Tahani Potier,lpn   Urological Symptom Review  Patient is experiencing the following symptoms: Frequent urination Hard to postpone urination Burning/pain with urination Get up at night to urinate Leakage of urine Stream starts and stops Trouble starting stream Weak stream Penile pain (male only)    Review of Systems  Gastrointestinal (upper)  : Negative for upper GI symptoms  Gastrointestinal (lower) : Negative for lower GI symptoms  Constitutional : Negative for symptoms  Skin: Negative for skin symptoms  Eyes: Blurred vision  Ear/Nose/Throat : Sinus problems  Hematologic/Lymphatic: Negative for Hematologic/Lymphatic symptoms  Cardiovascular : Negative for cardiovascular symptoms  Respiratory : Negative for respiratory symptoms  Endocrine: Negative for endocrine symptoms  Musculoskeletal: Joint pain  Neurological: Negative for neurological symptoms  Psychologic: Negative for psychiatric symptoms

## 2020-08-24 NOTE — Progress Notes (Signed)
08/24/2020 11:13 AM   William Kim December 15, 1965 [id number]  Referring provider: Perlie Mayo, NP [address] [place],  [place] [address]  Urinary frequency  HPI: [name] is a 55yo here for evaluation of urinary frequency. For the past 8 years he has noted increased urinary frequency every 1-2 hours, nocturia 3-5x, weak stream. IPSS 23 with QOL 6. He has to strain to urinate and then he develops pain with urination. No hematuria. He has C6-C7 fusion in 2020. His symptoms were present before the surgery. PSA 0.7 in 12/2019   PMH: Past Medical History:  Diagnosis Date  . A-fib ([place])   . Anemia    Phreesia 12/26/2019  . Anxiety   . Atrial fibrillation ([place])   . Cervicalgia   . Encounter for screening colonoscopy 07/02/2018  . High cholesterol   . Hypertension   . Obesity (BMI 30-39.9) 05/31/2018  . OSA (obstructive sleep apnea) 05/31/2018   Severe OSA with AHI 79/h with significant oxygen desaturations as low as 58%.  . Sleep apnea     Surgical History: Past Surgical History:  Procedure Laterality Date  . COLONOSCOPY WITH PROPOFOL N/A 08/02/2018   Procedure: COLONOSCOPY WITH PROPOFOL;  Surgeon: Daneil Dolin, MD;  [facility];  Service: Endoscopy;  Laterality: N/A;  12:15pm  . NECK SURGERY    . NERVE SURGERY    . None      Home Medications:  Allergies as of 08/24/2020   No Known Allergies     Medication List       Accurate as of August 24, 2020 11:13 AM. If you have any questions, ask your nurse or doctor.        aspirin EC 81 MG tablet Take 81 mg by mouth daily.   buPROPion 300 MG 24 hr tablet Commonly known as: WELLBUTRIN XL Take 300 mg by mouth daily.   cyclobenzaprine 10 MG tablet Commonly known as: FLEXERIL Take 1 tablet by mouth at bedtime.   diltiazem 180 MG 24 hr capsule Commonly known as: TIAZAC Take 1 capsule by mouth every morning.   diltiazem 180 MG 24 hr capsule Commonly known as: CARDIZEM CD TAKE 1 CAPSULE EVERY  MORNING   diltiazem 120 MG 24 hr capsule Commonly known as: CARDIZEM CD TAKE 1 CAPSULE AT BEDTIME (TAKE 180 MG IN THE MORNING)   fluticasone 50 MCG/ACT nasal spray Commonly known as: FLONASE Place 2 sprays into both nostrils daily. What changed:   when to take this  reasons to take this   gabapentin 300 MG capsule Commonly known as: NEURONTIN TAKE 1 CAPSULE BY MOUTH THREE TIMES A DAY   hydrOXYzine 50 MG capsule Commonly known as: VISTARIL TAKE ONE CAPSULE BY MOUTH TWICE A DAY AS NEEDED FOR ANXIETY AND PANIC ATTACKS   lidocaine 5 % Commonly known as: LIDODERM Place onto the skin.   methocarbamol 750 MG tablet Commonly known as: ROBAXIN Take 750 mg by mouth every 6 (six) hours as needed.   mirtazapine 30 MG tablet Commonly known as: REMERON   sildenafil 100 MG tablet Commonly known as: VIAGRA TAKE 1 TABLET (100 MG TOTAL) BY MOUTH DAILY AS NEEDED.   simvastatin 20 MG tablet Commonly known as: ZOCOR Take 20 mg by mouth at bedtime.   valACYclovir 500 MG tablet Commonly known as: VALTREX Take 1 tablet (500 mg total) by mouth daily.   VITAMIN C PO Take 1 tablet by mouth daily.       Allergies: No Known  Allergies  Family History: Family History  Problem Relation Age of Onset  . Healthy Mother   . Healthy Father   . Colon cancer Neg Hx   . Colon polyps Neg Hx     Social History:  reports that he has quit smoking. His smoking use included cigarettes. He has never used smokeless tobacco. He reports previous alcohol use. He reports that he does not use drugs.  ROS: All other review of systems were reviewed and are negative except what is noted above in HPI  Physical Exam: BP 134/80   Pulse 88   Temp 98.5 F (36.9 C)   Ht 6' (1.829 m)   Wt 205 lb (93 kg)   BMI 27.80 kg/m   Constitutional:  Alert and oriented, No acute distress. HEENT: [place] AT, moist mucus membranes.  Trachea midline, no masses. Cardiovascular: No clubbing, cyanosis, or  edema. Respiratory: Normal respiratory effort, no increased work of breathing. GI: Abdomen is soft, nontender, nondistended, no abdominal masses GU: No CVA tenderness. Circumcised phallus. No masses/lesions on penis, testis, scrotum. Prostate 40g smooth no nodules no induration.  Lymph: No cervical or inguinal lymphadenopathy. Skin: No rashes, bruises or suspicious lesions. Neurologic: Grossly intact, no focal deficits, moving all 4 extremities. Psychiatric: Normal mood and affect.  Laboratory Data: Lab Results  Component Value Date   WBC 4.9 12/29/2019   HGB 13.9 12/29/2019   HCT 41.7 12/29/2019   MCV 91.0 12/29/2019   PLT 209 12/29/2019    Lab Results  Component Value Date   CREATININE 1.00 12/29/2019    Lab Results  Component Value Date   PSA 0.7 12/29/2019    No results found for: TESTOSTERONE  Lab Results  Component Value Date   HGBA1C 5.4 10/31/2019    Urinalysis    Component Value Date/Time   COLORURINE COLORLESS (A) 04/08/2019 0821   APPEARANCEUR CLEAR 04/08/2019 0821   LABSPEC 1.001 (L) 04/08/2019 0821   PHURINE 8.0 04/08/2019 0821   GLUCOSEU NEGATIVE 04/08/2019 0821   HGBUR NEGATIVE 04/08/2019 0821   BILIRUBINUR negative 10/31/2019 1730   KETONESUR negative 10/31/2019 1730   KETONESUR 5 (A) 04/08/2019 0821   PROTEINUR negative 10/31/2019 1730   PROTEINUR NEGATIVE 04/08/2019 0821   UROBILINOGEN 0.2 10/31/2019 1730   NITRITE Negative 10/31/2019 1730   NITRITE NEGATIVE 04/08/2019 0821   LEUKOCYTESUR Negative 10/31/2019 1730   LEUKOCYTESUR NEGATIVE 04/08/2019 0821    No results found for: LABMICR, WBCUA, RBCUA, LABEPIT, MUCUS, BACTERIA  Pertinent Imaging:  No results found for this or any previous visit.  No results found for this or any previous visit.  No results found for this or any previous visit.  No results found for this or any previous visit.  No results found for this or any previous visit.  No results found for this or any  previous visit.  No results found for this or any previous visit.  No results found for this or any previous visit.   Assessment & Plan:    1. Urination, excessive at night -we will trial uroxatral 10mg  qhs - Urinalysis, Routine w reflex microscopic - BLADDER SCAN AMB NON-IMAGING  2. Benign prostatic hyperplasia with urinary obstruction -Uroxatral 10mg  qhs  3. Nocturia -Uroxatral 10mg  qhs   No follow-ups on file.  Nicolette Bang, MD  [facility] [place]

## 2020-09-01 ENCOUNTER — Other Ambulatory Visit: Payer: Self-pay | Admitting: Family Medicine

## 2020-09-01 DIAGNOSIS — N529 Male erectile dysfunction, unspecified: Secondary | ICD-10-CM

## 2020-09-01 DIAGNOSIS — F32A Depression, unspecified: Secondary | ICD-10-CM

## 2020-09-01 DIAGNOSIS — F419 Anxiety disorder, unspecified: Secondary | ICD-10-CM

## 2020-09-23 ENCOUNTER — Other Ambulatory Visit: Payer: Self-pay | Admitting: Family Medicine

## 2020-09-23 DIAGNOSIS — N529 Male erectile dysfunction, unspecified: Secondary | ICD-10-CM

## 2020-09-26 ENCOUNTER — Encounter: Payer: Self-pay | Admitting: Urology

## 2020-09-26 ENCOUNTER — Other Ambulatory Visit: Payer: Self-pay

## 2020-09-26 ENCOUNTER — Ambulatory Visit (INDEPENDENT_AMBULATORY_CARE_PROVIDER_SITE_OTHER): Payer: 59 | Admitting: Urology

## 2020-09-26 VITALS — BP 124/85 | HR 82 | Temp 99.3°F | Ht 72.0 in

## 2020-09-26 DIAGNOSIS — N401 Enlarged prostate with lower urinary tract symptoms: Secondary | ICD-10-CM

## 2020-09-26 DIAGNOSIS — N138 Other obstructive and reflux uropathy: Secondary | ICD-10-CM

## 2020-09-26 DIAGNOSIS — R351 Nocturia: Secondary | ICD-10-CM

## 2020-09-26 LAB — URINALYSIS, ROUTINE W REFLEX MICROSCOPIC
Bilirubin, UA: NEGATIVE
Glucose, UA: NEGATIVE
Ketones, UA: NEGATIVE
Leukocytes,UA: NEGATIVE
Nitrite, UA: NEGATIVE
Protein,UA: NEGATIVE
RBC, UA: NEGATIVE
Specific Gravity, UA: 1.025 (ref 1.005–1.030)
Urobilinogen, Ur: 0.2 mg/dL (ref 0.2–1.0)
pH, UA: 6 (ref 5.0–7.5)

## 2020-09-26 LAB — BLADDER SCAN AMB NON-IMAGING: Scan Result: 77

## 2020-09-26 MED ORDER — SILODOSIN 8 MG PO CAPS: 8.0000 mg | ORAL_CAPSULE | Freq: Every day | ORAL | 11 refills | Status: DC

## 2020-09-26 NOTE — Patient Instructions (Signed)

## 2020-09-26 NOTE — Progress Notes (Signed)
09/26/2020 4:08 PM   Delfino Lovett Jamey Reas [birth year] [id number]  Referring provider: Perlie Mayo, NP [address] [place],  [place] [address]  nocturia  HPI: [name] is a 55yo here for followup for BPH and nocturia. Nocturia decreased to 2-3x from 3-5x. Stream stronger but still weak. IPSS 20 QOL 6. He remains unhappy with his urination. No other complaints today   PMH: Past Medical History:  Diagnosis Date  . A-fib ([place])   . Anemia    Phreesia 12/26/2019  . Anxiety   . Atrial fibrillation ([place])   . Cervicalgia   . Encounter for screening colonoscopy 07/02/2018  . High cholesterol   . Hypertension   . Obesity (BMI 30-39.9) 05/31/2018  . OSA (obstructive sleep apnea) 05/31/2018   Severe OSA with AHI 79/h with significant oxygen desaturations as low as 58%.  . Sleep apnea     Surgical History: Past Surgical History:  Procedure Laterality Date  . COLONOSCOPY WITH PROPOFOL N/A 08/02/2018   Procedure: COLONOSCOPY WITH PROPOFOL;  Surgeon: Daneil Dolin, MD;  [facility];  Service: Endoscopy;  Laterality: N/A;  12:15pm  . NECK SURGERY    . NERVE SURGERY    . None      Home Medications:  Allergies as of 09/26/2020   No Known Allergies     Medication List       Accurate as of September 26, 2020  4:08 PM. If you have any questions, ask your nurse or doctor.        STOP taking these medications   diltiazem 180 MG 24 hr capsule Commonly known as: TIAZAC Stopped by: Nicolette Bang, MD     TAKE these medications   alfuzosin 10 MG 24 hr tablet Commonly known as: UROXATRAL Take 1 tablet (10 mg total) by mouth at bedtime.   aspirin EC 81 MG tablet Take 81 mg by mouth daily.   buPROPion 300 MG 24 hr tablet Commonly known as: WELLBUTRIN XL Take 300 mg by mouth daily.   cyclobenzaprine 10 MG tablet Commonly known as: FLEXERIL Take 1 tablet by mouth at bedtime.   diltiazem 180 MG 24 hr capsule Commonly known as: CARDIZEM CD TAKE 1 CAPSULE EVERY  MORNING   diltiazem 120 MG 24 hr capsule Commonly known as: CARDIZEM CD TAKE 1 CAPSULE AT BEDTIME (TAKE 180 MG IN THE MORNING)   fluticasone 50 MCG/ACT nasal spray Commonly known as: FLONASE Place 2 sprays into both nostrils daily. What changed:   when to take this  reasons to take this   gabapentin 300 MG capsule Commonly known as: NEURONTIN TAKE 1 CAPSULE BY MOUTH THREE TIMES A DAY   hydrOXYzine 50 MG capsule Commonly known as: VISTARIL TAKE ONE CAPSULE BY MOUTH TWICE A DAY AS NEEDED FOR ANXIETY AND PANIC ATTACKS   lidocaine 5 % Commonly known as: LIDODERM Place onto the skin.   methocarbamol 750 MG tablet Commonly known as: ROBAXIN Take 750 mg by mouth every 6 (six) hours as needed.   mirtazapine 30 MG tablet Commonly known as: REMERON   sildenafil 100 MG tablet Commonly known as: VIAGRA TAKE 1 TABLET (100 MG TOTAL) BY MOUTH DAILY AS NEEDED.   simvastatin 20 MG tablet Commonly known as: ZOCOR Take 20 mg by mouth at bedtime.   valACYclovir 500 MG tablet Commonly known as: VALTREX Take 1 tablet (500 mg total) by mouth daily.   VITAMIN C PO Take 1 tablet by mouth daily.  Allergies: No Known Allergies  Family History: Family History  Problem Relation Age of Onset  . Healthy Mother   . Healthy Father   . Colon cancer Neg Hx   . Colon polyps Neg Hx     Social History:  reports that he has quit smoking. His smoking use included cigarettes. He has never used smokeless tobacco. He reports previous alcohol use. He reports that he does not use drugs.  ROS: All other review of systems were reviewed and are negative except what is noted above in HPI  Physical Exam: BP 124/85   Pulse 82   Temp 99.3 F (37.4 C) (Oral)   Ht 6' (1.829 m)   BMI 27.80 kg/m   Constitutional:  Alert and oriented, No acute distress. HEENT: [place] AT, moist mucus membranes.  Trachea midline, no masses. Cardiovascular: No clubbing, cyanosis, or edema. Respiratory: Normal  respiratory effort, no increased work of breathing. GI: Abdomen is soft, nontender, nondistended, no abdominal masses GU: No CVA tenderness.  Lymph: No cervical or inguinal lymphadenopathy. Skin: No rashes, bruises or suspicious lesions. Neurologic: Grossly intact, no focal deficits, moving all 4 extremities. Psychiatric: Normal mood and affect.  Laboratory Data: Lab Results  Component Value Date   WBC 4.9 12/29/2019   HGB 13.9 12/29/2019   HCT 41.7 12/29/2019   MCV 91.0 12/29/2019   PLT 209 12/29/2019    Lab Results  Component Value Date   CREATININE 1.00 12/29/2019    Lab Results  Component Value Date   PSA 0.7 12/29/2019    No results found for: TESTOSTERONE  Lab Results  Component Value Date   HGBA1C 5.4 10/31/2019    Urinalysis    Component Value Date/Time   COLORURINE COLORLESS (A) 04/08/2019 0821   APPEARANCEUR Clear 08/24/2020 1207   LABSPEC 1.001 (L) 04/08/2019 0821   PHURINE 8.0 04/08/2019 0821   GLUCOSEU Negative 08/24/2020 1207   HGBUR NEGATIVE 04/08/2019 0821   BILIRUBINUR Negative 08/24/2020 1207   KETONESUR negative 10/31/2019 1730   KETONESUR 5 (A) 04/08/2019 0821   PROTEINUR Negative 08/24/2020 1207   PROTEINUR NEGATIVE 04/08/2019 0821   UROBILINOGEN 0.2 10/31/2019 1730   NITRITE Negative 08/24/2020 1207   NITRITE NEGATIVE 04/08/2019 0821   LEUKOCYTESUR Negative 08/24/2020 1207   LEUKOCYTESUR NEGATIVE 04/08/2019 0821    Lab Results  Component Value Date   LABMICR Comment 08/24/2020    Pertinent Imaging:  No results found for this or any previous visit.  No results found for this or any previous visit.  No results found for this or any previous visit.  No results found for this or any previous visit.  No results found for this or any previous visit.  No results found for this or any previous visit.  No results found for this or any previous visit.  No results found for this or any previous visit.   Assessment & Plan:     1. Benign prostatic hyperplasia with urinary obstruction -we will start rapaflo 8mg  qhs - Urinalysis, Routine w reflex microscopic - Bladder Scan (Post Void Residual) in office  2. Nocturia -rapaflo 8mg  qhs   Return in about 6 weeks (around 11/07/2020).  Nicolette Bang, MD  [facility] [place]

## 2020-09-26 NOTE — Progress Notes (Signed)
Bladder Scan=77   Urological Symptom Review  Patient is experiencing the following symptoms: Leakage in urine Getting up at night to urinate Urine stream starts and stops Trouble starting stream occasionally has to strain to urinate Erection problems Penile Pain sometimes   Review of Systems  Gastrointestinal (upper)  : Negative for upper GI symptoms  Gastrointestinal (lower) : Negative for lower GI symptoms  Constitutional : Negative for symptoms  Skin: Negative for skin symptoms  Eyes: Blurred vision  Ear/Nose/Throat : Sinus problems  Hematologic/Lymphatic: Easy bruising  Cardiovascular : Negative for cardiovascular symptoms  Respiratory : Negative for respiratory symptoms  Endocrine: Negative for endocrine symptoms  Musculoskeletal: Back pain Joint pain  Neurological: Negative for neurological symptoms  Psychologic: Negative for psychiatric symptoms

## 2020-10-08 ENCOUNTER — Telehealth: Payer: Self-pay | Admitting: Urology

## 2020-10-08 ENCOUNTER — Other Ambulatory Visit: Payer: Self-pay

## 2020-10-08 DIAGNOSIS — N138 Other obstructive and reflux uropathy: Secondary | ICD-10-CM

## 2020-10-08 MED ORDER — ALFUZOSIN HCL ER 10 MG PO TB24: ORAL_TABLET | ORAL | 0 refills | Status: DC

## 2020-10-08 MED ORDER — ALFUZOSIN HCL ER 10 MG PO TB24: ORAL_TABLET | ORAL | 7 refills | Status: DC

## 2020-10-08 NOTE — Telephone Encounter (Signed)
Pt called and stated that Dr. Alyson Ingles told him to double up on the medication Alfuzosin and that he needs a refill since he did that. Please call pt.

## 2020-10-09 ENCOUNTER — Other Ambulatory Visit: Payer: Self-pay

## 2020-10-09 NOTE — Progress Notes (Signed)
Patient unable to get rx due to rx being sent to 2 pharmacies. [facility] and [facility] tomorrow before patient runs out. [facility].

## 2020-10-10 ENCOUNTER — Other Ambulatory Visit: Payer: Self-pay | Admitting: Urology

## 2020-10-10 DIAGNOSIS — N138 Other obstructive and reflux uropathy: Secondary | ICD-10-CM

## 2020-10-10 DIAGNOSIS — N401 Enlarged prostate with lower urinary tract symptoms: Secondary | ICD-10-CM

## 2020-10-18 ENCOUNTER — Telehealth: Payer: Self-pay

## 2020-10-18 NOTE — Telephone Encounter (Signed)
Patient needing more 5 day refill on alfuzosin (UROXATRAL) 10 MG 24 hr tablet. [facility] call [facility].  Asking if the 90 day supply through [facility] could be checked on.  Thanks, Helene Kelp

## 2020-10-19 ENCOUNTER — Other Ambulatory Visit: Payer: Self-pay

## 2020-10-19 DIAGNOSIS — N138 Other obstructive and reflux uropathy: Secondary | ICD-10-CM

## 2020-10-19 MED ORDER — ALFUZOSIN HCL ER 10 MG PO TB24: ORAL_TABLET | ORAL | 1 refills | Status: DC

## 2020-10-19 NOTE — Telephone Encounter (Signed)
90 day supply sent to [facility].  Left message to return call if anything else needed.  [facility] at [facility]

## 2020-10-20 ENCOUNTER — Other Ambulatory Visit: Payer: Self-pay | Admitting: Internal Medicine

## 2020-10-20 DIAGNOSIS — N529 Male erectile dysfunction, unspecified: Secondary | ICD-10-CM

## 2020-10-30 ENCOUNTER — Other Ambulatory Visit: Payer: Self-pay

## 2020-10-30 ENCOUNTER — Ambulatory Visit
Admission: EM | Admit: 2020-10-30 | Discharge: 2020-10-30 | Disposition: A | Discharge status: Home / Self Care | Payer: 59 | Attending: Family Medicine | Admitting: Family Medicine

## 2020-10-30 ENCOUNTER — Encounter: Payer: Self-pay | Admitting: Emergency Medicine

## 2020-10-30 ENCOUNTER — Telehealth: Payer: Self-pay

## 2020-10-30 DIAGNOSIS — M549 Dorsalgia, unspecified: Secondary | ICD-10-CM | POA: Diagnosis not present

## 2020-10-30 LAB — POCT URINALYSIS DIP (MANUAL ENTRY)
Bilirubin, UA: NEGATIVE
Blood, UA: NEGATIVE
Glucose, UA: NEGATIVE mg/dL
Ketones, POC UA: NEGATIVE mg/dL
Leukocytes, UA: NEGATIVE
Nitrite, UA: NEGATIVE
Protein Ur, POC: NEGATIVE mg/dL
Spec Grav, UA: 1.01 (ref 1.010–1.025)
Urobilinogen, UA: 0.2 E.U./dL
pH, UA: 7 (ref 5.0–8.0)

## 2020-10-30 MED ORDER — KETOROLAC TROMETHAMINE 60 MG/2ML IM SOLN
60.0000 mg | Freq: Once | INTRAMUSCULAR | Status: AC
  Administered 2020-10-30: 60 mg via INTRAMUSCULAR

## 2020-10-30 NOTE — Telephone Encounter (Signed)
Patient having kidney stone pain. Made an appt for Wed. Needing an order to be put in to go to [facility].  Thanks, Helene Kelp

## 2020-10-30 NOTE — Telephone Encounter (Signed)
Called patient and patient stated that he was at Urgent care and they did not seem to think it was kidney stone pain but more muscular. Patient informed that we have never seen him for kidney stones and that he would need imaging to confirm. Patient asked if he still wanted to keep tomorrows appointment and patient stated yes.

## 2020-10-30 NOTE — ED Triage Notes (Signed)
Low back pain and slow urine flow for the past few days

## 2020-10-30 NOTE — Discharge Instructions (Signed)
Meds ordered this encounter  Medications   ketorolac (TORADOL) injection 60 mg

## 2020-10-31 ENCOUNTER — Other Ambulatory Visit: Payer: Self-pay

## 2020-10-31 ENCOUNTER — Encounter (HOSPITAL_COMMUNITY): Payer: Self-pay

## 2020-10-31 ENCOUNTER — Emergency Department (HOSPITAL_COMMUNITY)
Admission: EM | Admit: 2020-10-31 | Discharge: 2020-10-31 | Disposition: A | Discharge status: Home / Self Care | Payer: 59 | Attending: Emergency Medicine | Admitting: Emergency Medicine

## 2020-10-31 ENCOUNTER — Ambulatory Visit (INDEPENDENT_AMBULATORY_CARE_PROVIDER_SITE_OTHER): Payer: 59 | Admitting: Urology

## 2020-10-31 ENCOUNTER — Ambulatory Visit: Payer: 59 | Admitting: Urology

## 2020-10-31 ENCOUNTER — Emergency Department (HOSPITAL_COMMUNITY): Payer: 59

## 2020-10-31 ENCOUNTER — Encounter: Payer: Self-pay | Admitting: Urology

## 2020-10-31 VITALS — BP 123/85 | HR 79 | Temp 98.3°F | Wt 205.0 lb

## 2020-10-31 DIAGNOSIS — I10 Essential (primary) hypertension: Secondary | ICD-10-CM | POA: Diagnosis not present

## 2020-10-31 DIAGNOSIS — S39012A Strain of muscle, fascia and tendon of lower back, initial encounter: Secondary | ICD-10-CM | POA: Diagnosis not present

## 2020-10-31 DIAGNOSIS — Z87891 Personal history of nicotine dependence: Secondary | ICD-10-CM | POA: Insufficient documentation

## 2020-10-31 DIAGNOSIS — R351 Nocturia: Secondary | ICD-10-CM

## 2020-10-31 DIAGNOSIS — N138 Other obstructive and reflux uropathy: Secondary | ICD-10-CM | POA: Diagnosis not present

## 2020-10-31 DIAGNOSIS — N401 Enlarged prostate with lower urinary tract symptoms: Secondary | ICD-10-CM | POA: Diagnosis not present

## 2020-10-31 DIAGNOSIS — Z79899 Other long term (current) drug therapy: Secondary | ICD-10-CM | POA: Diagnosis not present

## 2020-10-31 DIAGNOSIS — M549 Dorsalgia, unspecified: Secondary | ICD-10-CM

## 2020-10-31 DIAGNOSIS — S3992XA Unspecified injury of lower back, initial encounter: Secondary | ICD-10-CM | POA: Diagnosis present

## 2020-10-31 DIAGNOSIS — X58XXXA Exposure to other specified factors, initial encounter: Secondary | ICD-10-CM | POA: Insufficient documentation

## 2020-10-31 LAB — URINALYSIS, ROUTINE W REFLEX MICROSCOPIC
Bilirubin Urine: NEGATIVE
Glucose, UA: NEGATIVE mg/dL
Hgb urine dipstick: NEGATIVE
Ketones, ur: NEGATIVE mg/dL
Leukocytes,Ua: NEGATIVE
Nitrite: NEGATIVE
Protein, ur: NEGATIVE mg/dL
Specific Gravity, Urine: 1.011 (ref 1.005–1.030)
pH: 9 — ABNORMAL HIGH (ref 5.0–8.0)

## 2020-10-31 LAB — COMPREHENSIVE METABOLIC PANEL
ALT: 53 U/L — ABNORMAL HIGH (ref 0–44)
AST: 27 U/L (ref 15–41)
Albumin: 4 g/dL (ref 3.5–5.0)
Alkaline Phosphatase: 70 U/L (ref 38–126)
Anion gap: 5 (ref 5–15)
BUN: 14 mg/dL (ref 6–20)
CO2: 25 mmol/L (ref 22–32)
Calcium: 9.1 mg/dL (ref 8.9–10.3)
Chloride: 108 mmol/L (ref 98–111)
Creatinine, Ser: 0.85 mg/dL (ref 0.61–1.24)
GFR, Estimated: >60 mL/min (ref >60)
Glucose, Bld: 97 mg/dL (ref 70–99)
Potassium: 3.9 mmol/L (ref 3.5–5.1)
Sodium: 138 mmol/L (ref 135–145)
Total Bilirubin: 1 mg/dL (ref 0.3–1.2)
Total Protein: 6.6 g/dL (ref 6.5–8.1)

## 2020-10-31 LAB — CBC WITH DIFFERENTIAL/PLATELET
Abs Immature Granulocytes: 0.01 10*3/uL (ref 0.00–0.07)
Basophils Absolute: 0 10*3/uL (ref 0.0–0.1)
Basophils Relative: 0 %
Eosinophils Absolute: 0.1 10*3/uL (ref 0.0–0.5)
Eosinophils Relative: 2 %
HCT: 40.3 % (ref 39.0–52.0)
Hemoglobin: 13.6 g/dL (ref 13.0–17.0)
Immature Granulocytes: 0 %
Lymphocytes Relative: 34 %
Lymphs Abs: 1.6 10*3/uL (ref 0.7–4.0)
MCH: 30.4 pg (ref 26.0–34.0)
MCHC: 33.7 g/dL (ref 30.0–36.0)
MCV: 90 fL (ref 80.0–100.0)
Monocytes Absolute: 0.5 10*3/uL (ref 0.1–1.0)
Monocytes Relative: 11 %
Neutro Abs: 2.5 10*3/uL (ref 1.7–7.7)
Neutrophils Relative %: 53 %
Platelets: 214 10*3/uL (ref 150–400)
RBC: 4.48 MIL/uL (ref 4.22–5.81)
RDW: 13.9 % (ref 11.5–15.5)
WBC: 4.7 10*3/uL (ref 4.0–10.5)
nRBC: 0 % (ref 0.0–0.2)

## 2020-10-31 MED ORDER — HYDROMORPHONE HCL 1 MG/ML IJ SOLN
0.5000 mg | Freq: Once | INTRAMUSCULAR | Status: AC
  Administered 2020-10-31: 0.5 mg via INTRAVENOUS
  Filled 2020-10-31: qty 1

## 2020-10-31 MED ORDER — ONDANSETRON HCL 4 MG/2ML IJ SOLN
4.0000 mg | Freq: Once | INTRAMUSCULAR | Status: AC
  Administered 2020-10-31: 4 mg via INTRAVENOUS
  Filled 2020-10-31: qty 2

## 2020-10-31 MED ORDER — ALFUZOSIN HCL ER 10 MG PO TB24: 10.0000 mg | ORAL_TABLET | Freq: Two times a day (BID) | ORAL | 3 refills | Status: DC

## 2020-10-31 MED ORDER — TRAMADOL HCL 50 MG PO TABS: 50.0000 mg | ORAL_TABLET | Freq: Four times a day (QID) | ORAL | 0 refills | Status: DC | PRN

## 2020-10-31 MED ORDER — KETOROLAC TROMETHAMINE 30 MG/ML IJ SOLN
30.0000 mg | Freq: Once | INTRAMUSCULAR | Status: AC
  Administered 2020-10-31: 30 mg via INTRAVENOUS
  Filled 2020-10-31: qty 1

## 2020-10-31 MED ORDER — CYCLOBENZAPRINE HCL 5 MG PO TABS: 5.0000 mg | ORAL_TABLET | Freq: Three times a day (TID) | ORAL | 2 refills | Status: DC | PRN

## 2020-10-31 NOTE — Patient Instructions (Signed)
Acute Back Pain, Adult Acute back pain is sudden and usually short-lived. It is often caused by an injury to the muscles and tissues in the back. The injury may result from:  A muscle or ligament getting overstretched or torn (strained). Ligaments are tissues that connect bones to each other. Lifting something improperly can cause a back strain.  Wear and tear (degeneration) of the spinal disks. Spinal disks are circular tissue that provide cushioning between the bones of the spine (vertebrae).  Twisting motions, such as while playing sports or doing yard work.  A hit to the back.  Arthritis. You may have a physical exam, lab tests, and imaging tests to find the cause of your pain. Acute back pain usually goes away with rest and home care. Follow these instructions at home: Managing pain, stiffness, and swelling  Treatment may include medicines for pain and inflammation that are taken by mouth or applied to the skin, prescription pain medicine, or muscle relaxants. Take over-the-counter and prescription medicines only as told by your health care provider.  Your health care provider may recommend applying ice during the first 24-48 hours after your pain starts. To do this: ? Put ice in a plastic bag. ? Place a towel between your skin and the bag. ? Leave the ice on for 20 minutes, 2-3 times a day.  If directed, apply heat to the affected area as often as told by your health care provider. Use the heat source that your health care provider recommends, such as a moist heat pack or a heating pad. ? Place a towel between your skin and the heat source. ? Leave the heat on for 20-30 minutes. ? Remove the heat if your skin turns bright red. This is especially important if you are unable to feel pain, heat, or cold. You have a greater risk of getting burned. Activity  Do not stay in bed. Staying in bed for more than 1-2 days can delay your recovery.  Sit up and stand up straight. Avoid leaning  forward when you sit or hunching over when you stand. ? If you work at a desk, sit close to it so you do not need to lean over. Keep your chin tucked in. Keep your neck drawn back, and keep your elbows bent at a 90-degree angle (right angle). ? Sit high and close to the steering wheel when you drive. Add lower back (lumbar) support to your car seat, if needed.  Take short walks on even surfaces as soon as you are able. Try to increase the length of time you walk each day.  Do not sit, drive, or stand in one place for more than 30 minutes at a time. Sitting or standing for long periods of time can put stress on your back.  Do not drive or use heavy machinery while taking prescription pain medicine.  Use proper lifting techniques. When you bend and lift, use positions that put less stress on your back: ? Bend your knees. ? Keep the load close to your body. ? Avoid twisting.  Exercise regularly as told by your health care provider. Exercising helps your back heal faster and helps prevent back injuries by keeping muscles strong and flexible.  Work with a physical therapist to make a safe exercise program, as recommended by your health care provider. Do any exercises as told by your physical therapist.   Lifestyle  Maintain a healthy weight. Extra weight puts stress on your back and makes it difficult to have   good posture.  Avoid activities or situations that make you feel anxious or stressed. Stress and anxiety increase muscle tension and can make back pain worse. Learn ways to manage anxiety and stress, such as through exercise. General instructions  Sleep on a firm mattress in a comfortable position. Try lying on your side with your knees slightly bent. If you lie on your back, put a pillow under your knees.  Follow your treatment plan as told by your health care provider. This may include: ? Cognitive or behavioral therapy. ? Acupuncture or massage therapy. ? Meditation or yoga. Contact  a health care provider if:  You have pain that is not relieved with rest or medicine.  You have increasing pain going down into your legs or buttocks.  Your pain does not improve after 2 weeks.  You have pain at night.  You lose weight without trying.  You have a fever or chills. Get help right away if:  You develop new bowel or bladder control problems.  You have unusual weakness or numbness in your arms or legs.  You develop nausea or vomiting.  You develop abdominal pain.  You feel faint. Summary  Acute back pain is sudden and usually short-lived.  Use proper lifting techniques. When you bend and lift, use positions that put less stress on your back.  Take over-the-counter and prescription medicines and apply heat or ice as directed by your health care provider. This information is not intended to replace advice given to you by your health care provider. Make sure you discuss any questions you have with your health care provider. Document Revised: 03/09/2020 Document Reviewed: 03/09/2020 Elsevier Patient Education  2021 Elsevier Inc.  

## 2020-10-31 NOTE — Progress Notes (Signed)
Urological Symptom Review  Patient is experiencing the following symptoms: Frequent urination Hard to postpone urination Get up at night to urinate Leakage of urine Stream starts and stops Trouble starting stream Have to strain to urinate Weak stream Penile pain (male only)    Review of Systems  Gastrointestinal (upper)  : Negative for upper GI symptoms  Gastrointestinal (lower) : Negative for lower GI symptoms  Constitutional : Negative for symptoms  Skin: Negative for skin symptoms  Eyes: Blurred vision  Ear/Nose/Throat : Sinus problems  Hematologic/Lymphatic: Negative for Hematologic/Lymphatic symptoms  Cardiovascular : Negative for cardiovascular symptoms  Respiratory : Shortness of breath  Endocrine: Negative for endocrine symptoms  Musculoskeletal: Back pain Joint pain  Neurological: Negative for neurological symptoms  Psychologic: Negative for psychiatric symptoms

## 2020-10-31 NOTE — Discharge Instructions (Addendum)
Follow-up with your family doctor for your back pain and when he sees urologist today tell them to check on your urinalysis which is still pending

## 2020-10-31 NOTE — ED Notes (Signed)
Pt verbalized understanding of d/c instructions.  Unable to obtain d/c signature due to e-signature pad not working .  Pt left with steady gait.

## 2020-10-31 NOTE — ED Provider Notes (Signed)
[facility]   [id number] 10/30/20 Arrival Time: [place]:  1. Mid back pain on left side    Benign abdominal exam. Without hematuria. Unlikely kidney stone. Discussed likely MSK etiology. No indications for urgent abdominal/pelvic imaging at this time. Discussed.  Meds ordered this encounter  Medications  . ketorolac (TORADOL) injection 60 mg   Activities as tolerated.   Follow-up Information    [facility] EMERGENCY DEPARTMENT.   Specialty: Emergency Medicine Why: If symptoms worsen in any way. Contact information: [address] [id number] Prudy Feeler [place] [address] [phone]              Reviewed expectations re: course of current medical issues. Questions answered. Outlined signs and symptoms indicating need for more acute intervention. Patient verbalized understanding. After Visit Summary given.   SUBJECTIVE: History from: patient. William Kim is a 55 y.o. male who presents with complaint of fairly persistent L sided low/mid back pain; gradual onset; noted few days ago. No specific aggravating or alleviating factors reported. Trouble sleeping secondary to pain. No extremity sensation changes or weakness. No specific abd exam. Afebrile. Without n/v/d. Pain worse after work. Questions "slow flow" with urination over few days. No tx PTA. He is concerned over possibility of kidney stone; no h/o.  Past Surgical History:  Procedure Laterality Date  . COLONOSCOPY WITH PROPOFOL N/A 08/02/2018   Procedure: COLONOSCOPY WITH PROPOFOL;  Surgeon: Daneil Dolin, MD;  [facility];  Service: Endoscopy;  Laterality: N/A;  12:15pm  . NECK SURGERY    . NERVE SURGERY    . None       OBJECTIVE:  Vitals:   10/30/20 1631  BP: 122/82  Pulse: 78  Resp: 18  Temp: 98.4 F (36.9 C)  TempSrc: Oral  SpO2: 97%    General appearance: alert, oriented, no acute distress but appears uncomfortable HEENT: [place]; AT; oropharynx  moist Lungs: unlabored respirations Abdomen: soft; without distention; no specific tenderness to palpation; without masses or organomegaly; without guarding or rebound tenderness Back: without reported CVA tenderness but with tenderness over mid back L paraspinal musculature; no midline tenderness; FROM at waist Extremities: without LE edema; symmetrical; without gross deformities Skin: warm and dry Neurologic: normal gait Psychological: alert and cooperative; normal mood and affect  Labs: Results for orders placed or performed during the hospital encounter of 10/30/20  POCT urinalysis dipstick  Result Value Ref Range   Color, UA yellow yellow   Clarity, UA clear clear   Glucose, UA negative negative mg/dL   Bilirubin, UA negative negative   Ketones, POC UA negative negative mg/dL   Spec Grav, UA 1.010 1.010 - 1.025   Blood, UA negative negative   pH, UA 7.0 5.0 - 8.0   Protein Ur, POC negative negative mg/dL   Urobilinogen, UA 0.2 0.2 or 1.0 E.U./dL   Nitrite, UA Negative Negative   Leukocytes, UA Negative Negative   Labs Reviewed  POCT URINALYSIS DIP (MANUAL ENTRY)    No Known Allergies                                             Past Medical History:  Diagnosis Date  . A-fib ([place])   . Anemia    Phreesia 12/26/2019  . Anxiety   . Atrial fibrillation ([place])   . Cervicalgia   . Encounter for  screening colonoscopy 07/02/2018  . High cholesterol   . Hypertension   . Obesity (BMI 30-39.9) 05/31/2018  . OSA (obstructive sleep apnea) 05/31/2018   Severe OSA with AHI 79/h with significant oxygen desaturations as low as 58%.  . Sleep apnea     Social History   Socioeconomic History  . Marital status: Married    Spouse name: Archie Patten   . Number of children: 2  . Years of education: Not on file  . Highest education level: 12th grade  Occupational History  . Occupation: welder  Tobacco Use  . Smoking status: Former Smoker    Types: Cigarettes  . Smokeless tobacco: Never  Used  . Tobacco comment: quit smoking in 2012/2013, quit smoking August 16th  Vaping Use  . Vaping Use: Never used  Substance and Sexual Activity  . Alcohol use: Not Currently    Comment: stopped 3-4 months ago  . Drug use: No  . Sexual activity: Not on file  Other Topics Concern  . Not on file  Social History Narrative   Lives with wife Archie Patten married 3/10 63 years   2 children   Son 39 lives in [place], 1 grandbaby 57 months old 2020   Daughter 47 she is living at home      Enjoys fishing      Diet: eats veggies and salmon, avoid red meats   Caffeine: does not drink    Water: 2 bottles day       Wear seat belt    Smoke detectors   Does not use phone while driving    Social Determinants of Radio broadcast assistant Strain: Not on file  Food Insecurity: Not on file  Transportation Needs: Not on file  Physical Activity: Not on file  Stress: Not on file  Social Connections: Not on file  Intimate Partner Violence: Not on file    Family History  Problem Relation Age of Onset  . Healthy Mother   . Healthy Father   . Colon cancer Neg Hx   . Colon polyps Neg Hx      Vanessa Kick, MD 10/31/20 [phone]

## 2020-10-31 NOTE — ED Notes (Signed)
Pt verbalized MSE, signature pad will not work.

## 2020-10-31 NOTE — ED Triage Notes (Signed)
Pt presents to ED with complaints of lower back pain since Saturday. Seen at Urgent Care yesterday.

## 2020-10-31 NOTE — ED Provider Notes (Signed)
[facility] EMERGENCY DEPARTMENT Provider Note   CSN: [id number] Arrival date & time: 10/31/20  0720     History Chief Complaint  Patient presents with  . Back Pain    William Kim is a 55 y.o. male.  Patient complains of lower back pain..  The pain seems to be worse with movement.  The history is provided by the patient and medical records. No language interpreter was used.  Back Pain Location:  Lumbar spine Quality:  Aching Radiates to:  Does not radiate Pain severity:  Moderate Pain is:  Worse during the day Onset quality:  Sudden Timing:  Constant Progression:  Worsening Chronicity:  New Context: not emotional stress   Relieved by:  Nothing Associated symptoms: no abdominal pain, no chest pain and no headaches        Past Medical History:  Diagnosis Date  . A-fib ([place])   . Anemia    Phreesia 12/26/2019  . Anxiety   . Atrial fibrillation ([place])   . Cervicalgia   . Encounter for screening colonoscopy 07/02/2018  . High cholesterol   . Hypertension   . Obesity (BMI 30-39.9) 05/31/2018  . OSA (obstructive sleep apnea) 05/31/2018   Severe OSA with AHI 79/h with significant oxygen desaturations as low as 58%.  . Sleep apnea     Patient Active Problem List   Diagnosis Date Noted  . Benign prostatic hyperplasia with urinary obstruction 08/24/2020  . Nocturia 07/05/2020  . Skin mole 07/05/2020  . Annual visit for general adult medical examination with abnormal findings 12/29/2019  . Encounter for screening for malignant neoplasm of prostate 12/29/2019  . Other fatigue 12/29/2019  . Encounter for hepatitis C screening test for low risk patient 12/29/2019  . Atrial fibrillation ([place]) 05/31/2019  . PTSD (post-traumatic stress disorder) 05/31/2019  . Anxiety and depression 05/31/2019  . Sleep disturbance 05/31/2019  . OSA (obstructive sleep apnea) 05/31/2018  . Benign essential HTN 05/31/2018    Past Surgical History:  Procedure Laterality Date  .  COLONOSCOPY WITH PROPOFOL N/A 08/02/2018   Procedure: COLONOSCOPY WITH PROPOFOL;  Surgeon: Daneil Dolin, MD;  Location: [facility];  Service: Endoscopy;  Laterality: N/A;  12:15pm  . NECK SURGERY    . NERVE SURGERY    . None         Family History  Problem Relation Age of Onset  . Healthy Mother   . Healthy Father   . Colon cancer Neg Hx   . Colon polyps Neg Hx     Social History   Tobacco Use  . Smoking status: Former Smoker    Types: Cigarettes  . Smokeless tobacco: Never Used  . Tobacco comment: quit smoking in 2012/2013, quit smoking August 16th  Vaping Use  . Vaping Use: Never used  Substance Use Topics  . Alcohol use: Not Currently    Comment: stopped 3-4 months ago  . Drug use: No    Home Medications Prior to Admission medications   Medication Sig Start Date End Date Taking? Authorizing Provider  alfuzosin (UROXATRAL) 10 MG 24 hr tablet TAKE 2 TABLETS BY MOUTH EVERY DAY AT BEDTIME Patient taking differently: Take 20 mg by mouth at bedtime. 10/19/20  Yes McKenzie, Candee Furbish, MD  Ascorbic Acid (VITAMIN C PO) Take 1 tablet by mouth daily.   Yes [provider]  aspirin EC 81 MG tablet Take 81 mg by mouth daily.   Yes [provider]  diclofenac (VOLTAREN) 75 MG EC tablet Take  75 mg by mouth 2 (two) times daily as needed for mild pain. 10/09/20  Yes [provider]  diltiazem (CARDIZEM CD) 120 MG 24 hr capsule TAKE 1 CAPSULE AT BEDTIME (TAKE 180 MG IN THE MORNING) Patient taking differently: Take 120 mg by mouth at bedtime. 08/09/20  Yes Sherran Needs, NP  diltiazem (CARDIZEM CD) 180 MG 24 hr capsule TAKE 1 CAPSULE EVERY MORNING Patient taking differently: Take 180 mg by mouth in the morning. 08/09/20  Yes Sherran Needs, NP  fluticasone (FLONASE) 50 MCG/ACT nasal spray Place 2 sprays into both nostrils daily. Patient taking differently: Place 2 sprays into both nostrils as needed. 07/09/19  Yes Wurst, [place], PA-C  lidocaine (LIDODERM)  5 % Place 1 patch onto the skin daily as needed (pain). 07/26/19  Yes [provider]  methocarbamol (ROBAXIN) 750 MG tablet Take 750 mg by mouth every 6 (six) hours as needed for muscle spasms. 06/26/20  Yes [provider]  sildenafil (VIAGRA) 100 MG tablet TAKE 1 TABLET (100 MG TOTAL) BY MOUTH DAILY AS NEEDED. Patient taking differently: Take 100 mg by mouth daily as needed for erectile dysfunction. 10/22/20  Yes Lindell Spar, MD  simvastatin (ZOCOR) 20 MG tablet Take 20 mg by mouth at bedtime. 11/18/19  Yes [provider]  traMADol (ULTRAM) 50 MG tablet Take 1 tablet (50 mg total) by mouth every 6 (six) hours as needed. 10/31/20  Yes Milton Ferguson, MD  valACYclovir (VALTREX) 500 MG tablet Take 1 tablet (500 mg total) by mouth daily. 08/13/20  Yes Perlie Mayo, NP  silodosin (RAPAFLO) 8 MG CAPS capsule Take 1 capsule (8 mg total) by mouth daily with breakfast. Patient not taking: No sig reported 09/26/20   Cleon Gustin, MD    Allergies    Patient has no known allergies.  Review of Systems   Review of Systems  Constitutional: Negative for appetite change and fatigue.  HENT: Negative for congestion, ear discharge and sinus pressure.   Eyes: Negative for discharge.  Respiratory: Negative for cough.   Cardiovascular: Negative for chest pain.  Gastrointestinal: Negative for abdominal pain and diarrhea.  Genitourinary: Negative for frequency and hematuria.  Musculoskeletal: Positive for back pain.  Skin: Negative for rash.  Neurological: Negative for seizures and headaches.  Psychiatric/Behavioral: Negative for hallucinations.    Physical Exam Updated Vital Signs BP 119/84 (BP Location: Left Arm)   Pulse 78   Temp 97.6 F (36.4 C) (Oral)   Resp 14   Ht 6' (1.829 m)   Wt 93 kg   SpO2 98%   BMI 27.80 kg/m   Physical Exam Vitals and nursing note reviewed.  Constitutional:      Appearance: He is well-developed.  HENT:     Head: Normocephalic.      Nose: Nose normal.  Eyes:     General: No scleral icterus.    Conjunctiva/sclera: Conjunctivae normal.  Neck:     Thyroid: No thyromegaly.  Cardiovascular:     Rate and Rhythm: Normal rate and regular rhythm.     Heart sounds: No murmur heard. No friction rub. No gallop.   Pulmonary:     Breath sounds: No stridor. No wheezing or rales.  Chest:     Chest wall: No tenderness.  Abdominal:     General: There is no distension.     Tenderness: There is no abdominal tenderness. There is no rebound.  Musculoskeletal:        General: Normal range of  motion.     Cervical back: Neck supple.     Comments: Tender lumbar spine minimal.  Negative straight leg raise  Lymphadenopathy:     Cervical: No cervical adenopathy.  Skin:    Findings: No erythema or rash.  Neurological:     Mental Status: He is alert and oriented to person, place, and time.     Motor: No abnormal muscle tone.     Coordination: Coordination normal.  Psychiatric:        Behavior: Behavior normal.     ED Results / Procedures / Treatments   Labs (all labs ordered are listed, but only abnormal results are displayed) Labs Reviewed  COMPREHENSIVE METABOLIC PANEL - Abnormal; Notable for the following components:      Result Value   ALT 53 (*)    All other components within normal limits  CBC WITH DIFFERENTIAL/PLATELET  URINALYSIS, ROUTINE W REFLEX MICROSCOPIC    EKG None  Radiology CT Renal Stone Study  Result Date: 10/31/2020 CLINICAL DATA:  LEFT flank pain since 10/27/2020, question kidney stone. No history of kidney stones. Denies hematuria. EXAM: CT ABDOMEN AND PELVIS WITHOUT CONTRAST TECHNIQUE: Multidetector CT imaging of the abdomen and pelvis was performed following the standard protocol without IV contrast. Sagittal and coronal MPR images reconstructed from axial data set. No oral contrast administered. COMPARISON:  None FINDINGS: Lower chest: Minimal RIGHT basilar atelectasis. Lung bases otherwise  clear. Hepatobiliary: Gallbladder and liver normal appearance Pancreas: Normal appearance Spleen: Normal appearance Adrenals/Urinary Tract: Adrenal glands, kidneys, ureters, and bladder normal appearance. Stomach/Bowel: Normal appendix. Scattered stool throughout colon. Stomach decompressed. Radiopacity within the small bowel loop in the LEFT mid abdomen image 55 question medication tablet. Bowel loops otherwise unremarkable. Vascular/Lymphatic: Atherosclerotic calcifications aorta and iliac arteries without aneurysm. No adenopathy. Reproductive: Prostate gland 4.3 x 3.3 x 3.1 cm (volume = 23 cm^3). Seminal vesicles unremarkable. Other: Small umbilical hernia containing fat. No free air or free fluid. No acute inflammatory process. Musculoskeletal: Degenerative disc disease changes L5-S1. No acute osseous findings. IMPRESSION: Small umbilical hernia containing fat. Minimal prostatic enlargement. No acute intra-abdominal or intrapelvic abnormalities. No cause for LEFT flank pain identified. Aortic Atherosclerosis (ICD10-I70.0). Electronically Signed   By: Lavonia Dana M.D.   On: 10/31/2020 08:45    Procedures Procedures   Medications Ordered in ED Medications  HYDROmorphone (DILAUDID) injection 0.5 mg (0.5 mg Intravenous Given 10/31/20 0820)  ondansetron (ZOFRAN) injection 4 mg (4 mg Intravenous Given 10/31/20 0820)  ketorolac (TORADOL) 30 MG/ML injection 30 mg (30 mg Intravenous Given 10/31/20 1027)    ED Course  I have reviewed the triage vital signs and the nursing notes.  Pertinent labs & imaging results that were available during my care of the patient were reviewed by me and considered in my medical decision making (see chart for details).    MDM Rules/Calculators/A&P                          Patient with back pain.  Suspect musculoskeletal pain.  CT scan shows degenerative changes L5-S1.  Patient could not wait for the urinalysis to come back because he had an appointment at 11 with urologist.   He was given Ultram and will follow up with primary care doctor Final Clinical Impression(s) / ED Diagnoses Final diagnoses:  Strain of lumbar region, initial encounter    Rx / DC Orders ED Discharge Orders         Ordered  traMADol (ULTRAM) 50 MG tablet  Every 6 hours PRN        10/31/20 1100           Milton Ferguson, MD 11/01/20 1644

## 2020-10-31 NOTE — Progress Notes (Signed)
10/31/2020 12:04 PM   William Kim [birth year] [id number]  Referring provider: Perlie Mayo, NP [address] [place],  William Kim [address]  followup BPH  HPI: [name] is a 55yo here for followup for BPH. He has mild LUTS on uroxatral 10mg  qhs. IPSS 15, QOL 2. Nocturia 1-3x. He has intermittent right flank pain and presented to the ER this morning. CT stone study showed no calculi. He was not given any medications for the pain on discharge from the ER.     PMH: Past Medical History:  Diagnosis Date  . A-fib ([place])   . Anemia    Phreesia 12/26/2019  . Anxiety   . Atrial fibrillation ([place])   . Cervicalgia   . Encounter for screening colonoscopy 07/02/2018  . High cholesterol   . Hypertension   . Obesity (BMI 30-39.9) 05/31/2018  . OSA (obstructive sleep apnea) 05/31/2018   Severe OSA with AHI 79/h with significant oxygen desaturations as low as 58%.  . Sleep apnea     Surgical History: Past Surgical History:  Procedure Laterality Date  . COLONOSCOPY WITH PROPOFOL N/A 08/02/2018   Procedure: COLONOSCOPY WITH PROPOFOL;  Surgeon: Daneil Dolin, MD;  Location[facility];  Service: Endoscopy;  Laterality: N/A;  12:15pm  . NECK SURGERY    . NERVE SURGERY    . None      Home Medications:  Allergies as of 10/31/2020   No Known Allergies     Medication List       Accurate as of Oct 31, 2020 12:04 PM. If you have any questions, ask your nurse or doctor.        STOP taking these medications   methocarbamol 750 MG tablet Commonly known as: ROBAXIN Stopped by: Nicolette Bang, MD   silodosin 8 MG Caps capsule Commonly known as: RAPAFLO Stopped by: Nicolette Bang, MD     TAKE these medications   alfuzosin 10 MG 24 hr tablet Commonly known as: UROXATRAL TAKE 2 TABLETS BY MOUTH EVERY DAY AT BEDTIME What changed:   how much to take  how to take this  when to take this  additional instructions   aspirin EC 81 MG tablet Take 81 mg by mouth daily.    diclofenac 75 MG EC tablet Commonly known as: VOLTAREN Take 75 mg by mouth 2 (two) times daily as needed for mild pain.   diltiazem 180 MG 24 hr capsule Commonly known as: CARDIZEM CD TAKE 1 CAPSULE EVERY MORNING What changed: when to take this   diltiazem 120 MG 24 hr capsule Commonly known as: CARDIZEM CD TAKE 1 CAPSULE AT BEDTIME (TAKE 180 MG IN THE MORNING) What changed: See the new instructions.   fluticasone 50 MCG/ACT nasal spray Commonly known as: FLONASE Place 2 sprays into both nostrils daily. What changed:   when to take this  reasons to take this   lidocaine 5 % Commonly known as: LIDODERM Place 1 patch onto the skin daily as needed (pain).   sildenafil 100 MG tablet Commonly known as: VIAGRA TAKE 1 TABLET (100 MG TOTAL) BY MOUTH DAILY AS NEEDED. What changed: reasons to take this   simvastatin 20 MG tablet Commonly known as: ZOCOR Take 20 mg by mouth at bedtime.   traMADol 50 MG tablet Commonly known as: ULTRAM Take 1 tablet (50 mg total) by mouth every 6 (six) hours as needed.   valACYclovir 500 MG tablet Commonly known as: VALTREX Take 1 tablet (500 mg total) by  mouth daily.   VITAMIN C PO Take 1 tablet by mouth daily.       Allergies: No Known Allergies  Family History: Family History  Problem Relation Age of Onset  . Healthy Mother   . Healthy Father   . Colon cancer Neg Hx   . Colon polyps Neg Hx     Social History:  reports that he has quit smoking. His smoking use included cigarettes. He has never used smokeless tobacco. He reports previous alcohol use. He reports that he does not use drugs.  ROS: All other review of systems were reviewed and are negative except what is noted above in HPI  Physical Exam: BP 123/85   Pulse 79   Temp 98.3 F (36.8 C)   Wt 205 lb (93 kg)   BMI 27.80 kg/m   Constitutional:  Alert and oriented, No acute distress. HEENT: [place] AT, moist mucus membranes.  Trachea midline, no  masses. Cardiovascular: No clubbing, cyanosis, or edema. Respiratory: Normal respiratory effort, no increased work of breathing. GI: Abdomen is soft, nontender, nondistended, no abdominal masses GU: No CVA tenderness.  Lymph: No cervical or inguinal lymphadenopathy. Skin: No rashes, bruises or suspicious lesions. Neurologic: Grossly intact, no focal deficits, moving all 4 extremities. Psychiatric: Normal mood and affect.  Laboratory Data: Lab Results  Component Value Date   WBC 4.7 10/31/2020   HGB 13.6 10/31/2020   HCT 40.3 10/31/2020   MCV 90.0 10/31/2020   PLT 214 10/31/2020    Lab Results  Component Value Date   CREATININE 0.85 10/31/2020    Lab Results  Component Value Date   PSA 0.7 12/29/2019    No results found for: TESTOSTERONE  Lab Results  Component Value Date   HGBA1C 5.4 10/31/2019    Urinalysis    Component Value Date/Time   COLORURINE YELLOW 10/31/2020 1134   APPEARANCEUR CLEAR 10/31/2020 1134   APPEARANCEUR Clear 09/26/2020 1612   LABSPEC 1.011 10/31/2020 1134   PHURINE 9.0 (H) 10/31/2020 1134   GLUCOSEU NEGATIVE 10/31/2020 1134   HGBUR NEGATIVE 10/31/2020 1134   [place] 10/31/2020 1134   BILIRUBINUR negative 10/30/2020 1634   BILIRUBINUR Negative 09/26/2020 1612   KETONESUR NEGATIVE 10/31/2020 1134   PROTEINUR NEGATIVE 10/31/2020 1134   UROBILINOGEN 0.2 10/30/2020 1634   NITRITE NEGATIVE 10/31/2020 1134   LEUKOCYTESUR NEGATIVE 10/31/2020 1134    Lab Results  Component Value Date   LABMICR Comment 09/26/2020    Pertinent Imaging: CT stone study today: Images reviewed and discussed with the patient No results found for this or any previous visit.  No results found for this or any previous visit.  No results found for this or any previous visit.  No results found for this or any previous visit.  No results found for this or any previous visit.  No results found for this or any previous visit.  No results found for  this or any previous visit.  Results for orders placed during the hospital encounter of 10/31/20  CT Renal Stone Study  Narrative CLINICAL DATA:  LEFT flank pain since 10/27/2020, question kidney stone. No history of kidney stones. Denies hematuria.  EXAM: CT ABDOMEN AND PELVIS WITHOUT CONTRAST  TECHNIQUE: Multidetector CT imaging of the abdomen and pelvis was performed following the standard protocol without IV contrast. Sagittal and coronal MPR images reconstructed from axial data set. No oral contrast administered.  COMPARISON:  None  FINDINGS: Lower chest: Minimal RIGHT basilar atelectasis. Lung bases otherwise clear.  Hepatobiliary: Gallbladder and  liver normal appearance  Pancreas: Normal appearance  Spleen: Normal appearance  Adrenals/Urinary Tract: Adrenal glands, kidneys, ureters, and bladder normal appearance.  Stomach/Bowel: Normal appendix. Scattered stool throughout colon. Stomach decompressed. Radiopacity within the small bowel loop in the LEFT mid abdomen image 55 question medication tablet. Bowel loops otherwise unremarkable.  Vascular/Lymphatic: Atherosclerotic calcifications aorta and iliac arteries without aneurysm. No adenopathy.  Reproductive: Prostate gland 4.3 x 3.3 x 3.1 cm (volume = 23 cm^3). Seminal vesicles unremarkable.  Other: Small umbilical hernia containing fat. No free air or free fluid. No acute inflammatory process.  Musculoskeletal: Degenerative disc disease changes L5-S1. No acute osseous findings.  IMPRESSION: Small umbilical hernia containing fat.  Minimal prostatic enlargement.  No acute intra-abdominal or intrapelvic abnormalities.  No cause for LEFT flank pain identified.  Aortic Atherosclerosis (ICD10-I70.0).   Electronically Signed By: Lavonia Dana M.D. On: 10/31/2020 08:45   Assessment & Plan:    1. Dorsalgia -flexeril 5mg  TID PRN  2. Benign prostatic hyperplasia with urinary obstruction -Uroxatral  10mg  BID  3. Nocturia -Uroxatral 10mg  BID   No follow-ups on file.  Nicolette Bang, MD  [facility] [place]

## 2020-11-07 ENCOUNTER — Other Ambulatory Visit: Payer: Self-pay | Admitting: Family Medicine

## 2020-11-13 ENCOUNTER — Encounter: Payer: Self-pay | Admitting: Urology

## 2020-11-13 ENCOUNTER — Other Ambulatory Visit: Payer: Self-pay

## 2020-11-13 ENCOUNTER — Ambulatory Visit (INDEPENDENT_AMBULATORY_CARE_PROVIDER_SITE_OTHER): Payer: 59 | Admitting: Urology

## 2020-11-13 DIAGNOSIS — N138 Other obstructive and reflux uropathy: Secondary | ICD-10-CM | POA: Diagnosis not present

## 2020-11-13 DIAGNOSIS — N401 Enlarged prostate with lower urinary tract symptoms: Secondary | ICD-10-CM

## 2020-11-13 LAB — URINALYSIS, ROUTINE W REFLEX MICROSCOPIC
Bilirubin, UA: NEGATIVE
Glucose, UA: NEGATIVE
Ketones, UA: NEGATIVE
Leukocytes,UA: NEGATIVE
Nitrite, UA: NEGATIVE
Protein,UA: NEGATIVE
RBC, UA: NEGATIVE
Specific Gravity, UA: 1.02 (ref 1.005–1.030)
Urobilinogen, Ur: 0.2 mg/dL (ref 0.2–1.0)
pH, UA: 6 (ref 5.0–7.5)

## 2020-11-13 MED ORDER — MIRABEGRON ER 25 MG PO TB24
25.0000 mg | ORAL_TABLET | Freq: Every day | ORAL | 0 refills | Status: DC
Start: 2020-11-13 — End: 2021-02-14

## 2020-11-13 MED ORDER — CIPROFLOXACIN HCL 500 MG PO TABS
500.0000 mg | ORAL_TABLET | Freq: Once | ORAL | Status: AC
Start: 2020-11-13 — End: 2020-11-13
  Administered 2020-11-13: 500 mg via ORAL

## 2020-11-13 NOTE — Patient Instructions (Signed)

## 2020-11-13 NOTE — Progress Notes (Signed)
   11/13/20  CC: followup BPH  HPI: [name] is a 55yo here for cystoscopy for LUTS refractory to alpha blocker therapy.  Blood pressure 130/77, pulse 91, temperature 99 F (37.2 C), height 6' (1.829 m), weight 205 lb (93 kg). NED. A&Ox3.   No respiratory distress   Abd soft, NT, ND Normal phallus with bilateral descended testicles  Cystoscopy Procedure Note  Patient identification was confirmed, informed consent was obtained, and patient was prepped using Betadine solution.  Lidocaine jelly was administered per urethral meatus.     Pre-Procedure: - Inspection reveals a normal caliber ureteral meatus.  Procedure: The flexible cystoscope was introduced without difficulty - No urethral strictures/lesions are present. - Enlarged prostate bilobar hyperplasia. No median lobe - Normal bladder neck - Bilateral ureteral orifices identified - Bladder mucosa  reveals no ulcers, tumors, or lesions - No bladder stones - No trabeculation  Retroflexion shows no intravesical prostatic protrusion   Post-Procedure: - Patient tolerated the procedure well  Assessment/ Plan: We will continue uroxatral 10mg  qhs and start mirabegron 25mg  daily  Return in about 6 weeks (around 12/25/2020).  Nicolette Bang, MD

## 2020-11-13 NOTE — Progress Notes (Signed)
Urological Symptom Review  Patient is experiencing the following symptoms: Frequent urination Hard to postpone urination Get up at night to urinate Stream starts and stops Trouble starting stream Weak stream   Review of Systems  Gastrointestinal (upper)  : Negative for upper GI symptoms  Gastrointestinal (lower) : Negative for lower GI symptoms  Constitutional : Negative for symptoms  Skin: Negative for skin symptoms  Eyes: Negative for eye symptoms  Ear/Nose/Throat : Sinus problems  Hematologic/Lymphatic: Negative for Hematologic/Lymphatic symptoms  Cardiovascular : Negative for cardiovascular symptoms  Respiratory : Negative for respiratory symptoms  Endocrine: Negative for endocrine symptoms  Musculoskeletal: Back pain Joint pain  Neurological: Headaches  Psychologic: Depression Anxiety

## 2020-11-18 ENCOUNTER — Other Ambulatory Visit: Payer: Self-pay | Admitting: Internal Medicine

## 2020-11-18 DIAGNOSIS — N529 Male erectile dysfunction, unspecified: Secondary | ICD-10-CM

## 2020-11-27 ENCOUNTER — Ambulatory Visit: Payer: 59 | Admitting: Urology

## 2020-12-15 ENCOUNTER — Other Ambulatory Visit: Payer: Self-pay | Admitting: Internal Medicine

## 2020-12-15 DIAGNOSIS — N529 Male erectile dysfunction, unspecified: Secondary | ICD-10-CM

## 2020-12-25 ENCOUNTER — Encounter: Payer: Self-pay | Admitting: Urology

## 2020-12-25 ENCOUNTER — Other Ambulatory Visit: Payer: Self-pay

## 2020-12-25 ENCOUNTER — Telehealth (INDEPENDENT_AMBULATORY_CARE_PROVIDER_SITE_OTHER): Payer: 59 | Admitting: Urology

## 2020-12-25 DIAGNOSIS — N138 Other obstructive and reflux uropathy: Secondary | ICD-10-CM

## 2020-12-25 DIAGNOSIS — N401 Enlarged prostate with lower urinary tract symptoms: Secondary | ICD-10-CM | POA: Diagnosis not present

## 2020-12-25 DIAGNOSIS — N3281 Overactive bladder: Secondary | ICD-10-CM | POA: Insufficient documentation

## 2020-12-25 DIAGNOSIS — R351 Nocturia: Secondary | ICD-10-CM

## 2020-12-25 MED ORDER — ALFUZOSIN HCL ER 10 MG PO TB24: 10.0000 mg | ORAL_TABLET | Freq: Two times a day (BID) | ORAL | 3 refills | Status: DC

## 2020-12-25 NOTE — Progress Notes (Signed)
12/25/2020 4:13 PM   William Kim William Kim [birth year] [id number]  Referring provider: Perlie Mayo, NP [address] [place],  [place] [address]  Patient location: home Physician location: office I connected with  William Kim on 12/25/20 by a video enabled telemedicine application and verified that I am speaking with the correct person using two identifiers.   I discussed the limitations of evaluation and management by telemedicine. The patient expressed understanding and agreed to proceed.     HPI: William Kim is a 55yo here for followup for BPH with Nocturia. His nocturia has decreased to 1-2x based on fluid management. He notes his pelvic pain has improved since increasing uroxatral to 10mg  BID. Urine stream strong. NO hesitancy. No dysuria or hematuria. NO other complaints today   PMH: Past Medical History:  Diagnosis Date   A-fib ([place])    Anemia    Phreesia 12/26/2019   Anxiety    Atrial fibrillation ([place])    Cervicalgia    Encounter for screening colonoscopy 07/02/2018   High cholesterol    Hypertension    Obesity (BMI 30-39.9) 05/31/2018   OSA (obstructive sleep apnea) 05/31/2018   Severe OSA with AHI 79/h with significant oxygen desaturations as low as 58%.   Sleep apnea     Surgical History: Past Surgical History:  Procedure Laterality Date   COLONOSCOPY WITH PROPOFOL N/A 08/02/2018   Procedure: COLONOSCOPY WITH PROPOFOL;  Surgeon: Daneil Dolin, MD;  [facility];  Service: Endoscopy;  Laterality: N/A;  12:15pm   NECK SURGERY     NERVE SURGERY     None      Home Medications:  Allergies as of 12/25/2020   No Known Allergies      Medication List        Accurate as of December 25, 2020  4:13 PM. If you have any questions, ask your nurse or doctor.          alfuzosin 10 MG 24 hr tablet Commonly known as: UROXATRAL Take 1 tablet (10 mg total) by mouth in the morning and at bedtime. BID   aspirin EC 81 MG tablet Take 81 mg by mouth daily.    cyclobenzaprine 5 MG tablet Commonly known as: FLEXERIL Take 1 tablet (5 mg total) by mouth 3 (three) times daily as needed for muscle spasms.   diclofenac 75 MG EC tablet Commonly known as: VOLTAREN Take 75 mg by mouth 2 (two) times daily as needed for mild pain.   diltiazem 180 MG 24 hr capsule Commonly known as: CARDIZEM CD TAKE 1 CAPSULE EVERY MORNING What changed: when to take this   diltiazem 120 MG 24 hr capsule Commonly known as: CARDIZEM CD TAKE 1 CAPSULE AT BEDTIME (TAKE 180 MG IN THE MORNING) What changed: See the new instructions.   fluticasone 50 MCG/ACT nasal spray Commonly known as: FLONASE Place 2 sprays into both nostrils daily. What changed:  when to take this reasons to take this   lidocaine 5 % Commonly known as: LIDODERM Place 1 patch onto the skin daily as needed (pain).   mirabegron ER 25 MG Tb24 tablet Commonly known as: MYRBETRIQ Take 1 tablet (25 mg total) by mouth daily.   sildenafil 100 MG tablet Commonly known as: VIAGRA TAKE 1 TABLET (100 MG TOTAL) BY MOUTH DAILY AS NEEDED.   simvastatin 20 MG tablet Commonly known as: ZOCOR Take 20 mg by mouth at bedtime.   traMADol 50 MG tablet Commonly known as: ULTRAM Take  1 tablet (50 mg total) by mouth every 6 (six) hours as needed.   valACYclovir 500 MG tablet Commonly known as: VALTREX TAKE 1 TABLET DAILY   VITAMIN C PO Take 1 tablet by mouth daily.        Allergies: No Known Allergies  Family History: Family History  Problem Relation Age of Onset   Healthy Mother    Healthy Father    Colon cancer Neg Hx    Colon polyps Neg Hx     Social History:  reports that he has quit smoking. His smoking use included cigarettes. He has never used smokeless tobacco. He reports previous alcohol use. He reports that he does not use drugs.  ROS: All other review of systems were reviewed and are negative except what is noted above in HPI   Laboratory Data: Lab Results  Component Value  Date   WBC 4.7 10/31/2020   HGB 13.6 10/31/2020   HCT 40.3 10/31/2020   MCV 90.0 10/31/2020   PLT 214 10/31/2020    Lab Results  Component Value Date   CREATININE 0.85 10/31/2020    Lab Results  Component Value Date   PSA 0.7 12/29/2019    No results found for: TESTOSTERONE  Lab Results  Component Value Date   HGBA1C 5.4 10/31/2019    Urinalysis    Component Value Date/Time   COLORURINE YELLOW 10/31/2020 1134   APPEARANCEUR Clear 11/13/2020 1545   LABSPEC 1.011 10/31/2020 1134   PHURINE 9.0 (H) 10/31/2020 1134   GLUCOSEU Negative 11/13/2020 1545   HGBUR NEGATIVE 10/31/2020 1134   BILIRUBINUR Negative 11/13/2020 1545   KETONESUR NEGATIVE 10/31/2020 1134   PROTEINUR Negative 11/13/2020 1545   PROTEINUR NEGATIVE 10/31/2020 1134   UROBILINOGEN 0.2 10/30/2020 1634   NITRITE Negative 11/13/2020 1545   NITRITE NEGATIVE 10/31/2020 1134   LEUKOCYTESUR Negative 11/13/2020 1545   LEUKOCYTESUR NEGATIVE 10/31/2020 1134    Lab Results  Component Value Date   LABMICR Comment 11/13/2020    Pertinent Imaging:  No results found for this or any previous visit.  No results found for this or any previous visit.  No results found for this or any previous visit.  No results found for this or any previous visit.  No results found for this or any previous visit.  No results found for this or any previous visit.  No results found for this or any previous visit.  Results for orders placed during the hospital encounter of 10/31/20  CT Renal Stone Study  Narrative CLINICAL DATA:  LEFT flank pain since 10/27/2020, question kidney stone. No history of kidney stones. Denies hematuria.  EXAM: CT ABDOMEN AND PELVIS WITHOUT CONTRAST  TECHNIQUE: Multidetector CT imaging of the abdomen and pelvis was performed following the standard protocol without IV contrast. Sagittal and coronal MPR images reconstructed from axial data set. No oral contrast administered.  COMPARISON:   None  FINDINGS: Lower chest: Minimal RIGHT basilar atelectasis. Lung bases otherwise clear.  Hepatobiliary: Gallbladder and liver normal appearance  Pancreas: Normal appearance  Spleen: Normal appearance  Adrenals/Urinary Tract: Adrenal glands, kidneys, ureters, and bladder normal appearance.  Stomach/Bowel: Normal appendix. Scattered stool throughout colon. Stomach decompressed. Radiopacity within the small bowel loop in the LEFT mid abdomen image 55 question medication tablet. Bowel loops otherwise unremarkable.  Vascular/Lymphatic: Atherosclerotic calcifications aorta and iliac arteries without aneurysm. No adenopathy.  Reproductive: Prostate gland 4.3 x 3.3 x 3.1 cm (volume = 23 cm^3). Seminal vesicles unremarkable.  Other: Small umbilical hernia containing fat. No  free air or free fluid. No acute inflammatory process.  Musculoskeletal: Degenerative disc disease changes L5-S1. No acute osseous findings.  IMPRESSION: Small umbilical hernia containing fat.  Minimal prostatic enlargement.  No acute intra-abdominal or intrapelvic abnormalities.  No cause for LEFT flank pain identified.  Aortic Atherosclerosis (ICD10-I70.0).   Electronically Signed By: Lavonia Dana M.D. On: 10/31/2020 08:45   Assessment & Plan:    1. Benign prostatic hyperplasia with urinary obstruction -Continue uroxatral BID  2. Nocturia -Continue uroxatral 10mg  BID    No follow-ups on file.  Nicolette Bang, MD  [facility] [place]

## 2020-12-25 NOTE — Patient Instructions (Signed)

## 2021-01-03 ENCOUNTER — Encounter: Payer: 59 | Admitting: Family Medicine

## 2021-01-18 ENCOUNTER — Other Ambulatory Visit: Payer: Self-pay | Admitting: Internal Medicine

## 2021-01-18 DIAGNOSIS — N529 Male erectile dysfunction, unspecified: Secondary | ICD-10-CM

## 2021-01-29 ENCOUNTER — Encounter: Payer: 59 | Admitting: Family Medicine

## 2021-01-29 ENCOUNTER — Encounter: Payer: 59 | Admitting: Nurse Practitioner

## 2021-02-14 ENCOUNTER — Ambulatory Visit (INDEPENDENT_AMBULATORY_CARE_PROVIDER_SITE_OTHER): Payer: 59 | Admitting: Nurse Practitioner

## 2021-02-14 ENCOUNTER — Other Ambulatory Visit: Payer: Self-pay

## 2021-02-14 ENCOUNTER — Encounter: Payer: Self-pay | Admitting: Nurse Practitioner

## 2021-02-14 VITALS — BP 124/74 | HR 80 | Temp 98.8°F | Ht 74.0 in | Wt 199.0 lb

## 2021-02-14 DIAGNOSIS — Z0001 Encounter for general adult medical examination with abnormal findings: Secondary | ICD-10-CM | POA: Diagnosis not present

## 2021-02-14 DIAGNOSIS — Z139 Encounter for screening, unspecified: Secondary | ICD-10-CM | POA: Diagnosis not present

## 2021-02-14 DIAGNOSIS — G8929 Other chronic pain: Secondary | ICD-10-CM

## 2021-02-14 DIAGNOSIS — I10 Essential (primary) hypertension: Secondary | ICD-10-CM

## 2021-02-14 DIAGNOSIS — N401 Enlarged prostate with lower urinary tract symptoms: Secondary | ICD-10-CM

## 2021-02-14 DIAGNOSIS — N138 Other obstructive and reflux uropathy: Secondary | ICD-10-CM

## 2021-02-14 DIAGNOSIS — E785 Hyperlipidemia, unspecified: Secondary | ICD-10-CM

## 2021-02-14 DIAGNOSIS — I480 Paroxysmal atrial fibrillation: Secondary | ICD-10-CM

## 2021-02-14 DIAGNOSIS — R519 Headache, unspecified: Secondary | ICD-10-CM | POA: Diagnosis not present

## 2021-02-14 DIAGNOSIS — M25511 Pain in right shoulder: Secondary | ICD-10-CM | POA: Insufficient documentation

## 2021-02-14 NOTE — Assessment & Plan Note (Addendum)
-  takes alfuzosin 10 mg BID -no issues reported today -followed by Dr. Alyson Ingles -checking PSA

## 2021-02-14 NOTE — Patient Instructions (Signed)
Please have fasting labs drawn in the next 7 days.  We will meet up again in 6 months for a lab check. Please have fasting labs drawn 2-3 days prior to your appointment so we can discuss the results during your office visit.

## 2021-02-14 NOTE — Assessment & Plan Note (Signed)
-  states he hurt his right shoulder lifting weights in 1988 in the [place] -has pain with extreme extension and external rotation -suggested ortho referral, but he is not interested at this time

## 2021-02-14 NOTE — Assessment & Plan Note (Signed)
-  he states he has this daily for the last 4-5 months and it hurts more with talking -he is taking voltaren daily and using a pain cream -unsure of etiology of headache -referral to neuro

## 2021-02-14 NOTE — Assessment & Plan Note (Signed)
BP Readings from Last 3 Encounters:  02/14/21 124/74  11/13/20 130/77  10/31/20 123/85  -well controlled

## 2021-02-14 NOTE — Assessment & Plan Note (Addendum)
-  takes cardizem for rate control; 120 mg qHS and 180 mg qAM -no anticoagulation per cardiology; chads2vasc was 1 so he is low risk for CVA/TIA -regular rhythm on exam today; had NSR on EKG from 06/12/20

## 2021-02-14 NOTE — Progress Notes (Signed)
Established Patient Office Visit  Subjective:  Patient ID: CHIMAOBI CASEBOLT, male    DOB: 1966-04-20  Age: 54 y.o. MRN: [id number]  CC:  Chief Complaint  Patient presents with   Annual Exam    CPE. Complains of head pain on the R side that happens intermittently for the past 4-5 months.     HPI William Kim presents for physical exam.  He has had a headache about every day for the last 4-5 months. He states that it feels like a water balloon that wants to pop. He states the pain is worse when he talks. He rates his pain at 1-2 now, but has got as high as 4-5. Denies vision changes.   Past Medical History:  Diagnosis Date   A-fib ([place])    Anemia    Phreesia 12/26/2019   Anxiety    Atrial fibrillation ([place])    Cervicalgia    Encounter for screening colonoscopy 07/02/2018   High cholesterol    Hypertension    Obesity (BMI 30-39.9) 05/31/2018   OSA (obstructive sleep apnea) 05/31/2018   Severe OSA with AHI 79/h with significant oxygen desaturations as low as 58%.   Sleep apnea     Past Surgical History:  Procedure Laterality Date   COLONOSCOPY WITH PROPOFOL N/A 08/02/2018   Procedure: COLONOSCOPY WITH PROPOFOL;  Surgeon: Daneil Dolin, MD;  [facility];  Service: Endoscopy;  Laterality: N/A;  12:15pm   NECK SURGERY     NERVE SURGERY     None      Family History  Problem Relation Age of Onset   Healthy Mother    Healthy Father    Colon cancer Neg Hx    Colon polyps Neg Hx     Social History   Socioeconomic History   Marital status: Married    Spouse name: Archie Patten    Number of children: 2   Years of education: Not on file   Highest education level: 12th grade  Occupational History   Occupation: welder  Tobacco Use   Smoking status: Former    Types: Cigarettes   Smokeless tobacco: Never   Tobacco comments:    quit smoking in 2012/2013, quit smoking August 16th  Vaping Use   Vaping Use: Never used  Substance and Sexual Activity   Alcohol  use: Not Currently    Comment: stopped 3-4 months ago   Drug use: No   Sexual activity: Not on file  Other Topics Concern   Not on file  Social History Narrative   Lives with wife Archie Patten married 3/10 21 years   2 children   Son 78 lives in [place], 1 grandbaby 19 months old 2020   Daughter 33 she is living at home      Enjoys fishing      Diet: eats veggies and salmon, avoid red meats   Caffeine: does not drink    Water: 2 bottles day       Wear seat belt    Smoke detectors   Does not use phone while driving    Social Determinants of Radio broadcast assistant Strain: Not on file  Food Insecurity: Not on file  Transportation Needs: Not on file  Physical Activity: Not on file  Stress: Not on file  Social Connections: Not on file  Intimate Partner Violence: Not on file    Outpatient Medications Prior to Visit  Medication Sig Dispense Refill   alfuzosin (UROXATRAL) 10 MG 24 hr  tablet Take 1 tablet (10 mg total) by mouth in the morning and at bedtime. BID 180 tablet 3   Ascorbic Acid (VITAMIN C PO) Take 1 tablet by mouth daily.     aspirin EC 81 MG tablet Take 81 mg by mouth daily.     diclofenac (VOLTAREN) 75 MG EC tablet Take 75 mg by mouth 2 (two) times daily as needed for mild pain.     diltiazem (CARDIZEM CD) 120 MG 24 hr capsule TAKE 1 CAPSULE AT BEDTIME (TAKE 180 MG IN THE MORNING) (Patient taking differently: Take 120 mg by mouth at bedtime.) 90 capsule 3   diltiazem (CARDIZEM CD) 180 MG 24 hr capsule TAKE 1 CAPSULE EVERY MORNING (Patient taking differently: Take 180 mg by mouth in the morning.) 90 capsule 3   fluticasone (FLONASE) 50 MCG/ACT nasal spray Place 2 sprays into both nostrils daily. (Patient taking differently: Place 2 sprays into both nostrils as needed.) 16 g 0   lidocaine (LIDODERM) 5 % Place 1 patch onto the skin daily as needed (pain).     sildenafil (VIAGRA) 100 MG tablet TAKE 1 TABLET (100 MG TOTAL) BY MOUTH DAILY AS NEEDED. 10 tablet 0   simvastatin  (ZOCOR) 10 MG tablet Take by mouth at bedtime.     valACYclovir (VALTREX) 500 MG tablet TAKE 1 TABLET DAILY 90 tablet 3   cyclobenzaprine (FLEXERIL) 5 MG tablet Take 1 tablet (5 mg total) by mouth 3 (three) times daily as needed for muscle spasms. (Patient not taking: Reported on 02/14/2021) 30 tablet 2   mirabegron ER (MYRBETRIQ) 25 MG TB24 tablet Take 1 tablet (25 mg total) by mouth daily. 30 tablet 0   traMADol (ULTRAM) 50 MG tablet Take 1 tablet (50 mg total) by mouth every 6 (six) hours as needed. (Patient not taking: Reported on 02/14/2021) 20 tablet 0   No facility-administered medications prior to visit.    No Known Allergies  ROS Review of Systems  Constitutional: Negative.   HENT: Negative.    Eyes: Negative.   Respiratory: Negative.    Cardiovascular: Negative.   Gastrointestinal: Negative.   Endocrine: Negative.   Genitourinary: Negative.   Musculoskeletal:        Right shoulder pain  Skin: Negative.   Allergic/Immunologic: Negative.   Neurological:  Positive for headaches.  Hematological: Negative.   Psychiatric/Behavioral: Negative.       Objective:    Physical Exam Constitutional:      Appearance: Normal appearance.  HENT:     Head: Normocephalic and atraumatic.     Right Ear: Tympanic membrane, ear canal and external ear normal.     Left Ear: Tympanic membrane, ear canal and external ear normal.     Nose: Nose normal.     Mouth/Throat:     Mouth: Mucous membranes are moist.     Pharynx: Oropharynx is clear.  Eyes:     Extraocular Movements: Extraocular movements intact.     Conjunctiva/sclera: Conjunctivae normal.     Pupils: Pupils are equal, round, and reactive to light.  Cardiovascular:     Rate and Rhythm: Normal rate and regular rhythm.     Pulses: Normal pulses.     Heart sounds: Normal heart sounds.  Pulmonary:     Effort: Pulmonary effort is normal.     Breath sounds: Normal breath sounds.  Abdominal:     General: Abdomen is flat. Bowel  sounds are normal.     Palpations: Abdomen is soft.  Musculoskeletal:  Cervical back: Normal range of motion and neck supple.     Comments: Right shoulder pain with extension and external rotation  Skin:    General: Skin is warm and dry.     Capillary Refill: Capillary refill takes less than 2 seconds.  Neurological:     General: No focal deficit present.     Mental Status: He is alert and oriented to person, place, and time.     Cranial Nerves: No cranial nerve deficit.     Sensory: No sensory deficit.     Motor: No weakness.     Coordination: Coordination normal.     Gait: Gait normal.  Psychiatric:        Mood and Affect: Mood normal.        Behavior: Behavior normal.        Thought Content: Thought content normal.        Judgment: Judgment normal.    BP 124/74 (BP Location: Left Arm, Patient Position: Sitting, Cuff Size: Large)   Pulse 80   Temp 98.8 F (37.1 C) (Oral)   Ht '6\' 2"'  (1.88 m)   Wt 199 lb (90.3 kg)   SpO2 95%   BMI 25.55 kg/m  Wt Readings from Last 3 Encounters:  02/14/21 199 lb (90.3 kg)  11/13/20 205 lb (93 kg)  10/31/20 205 lb (93 kg)     Health Maintenance Due  Topic Date Due   Pneumococcal Vaccine 55-42 Years old (1 - PCV) Never done   HIV Screening  Never done   Hepatitis C Screening  Never done   INFLUENZA VACCINE  01/28/2021    There are no preventive care reminders to display for this patient.  Lab Results  Component Value Date   TSH 0.86 12/29/2019   Lab Results  Component Value Date   WBC 4.7 10/31/2020   HGB 13.6 10/31/2020   HCT 40.3 10/31/2020   MCV 90.0 10/31/2020   PLT 214 10/31/2020   Lab Results  Component Value Date   NA 138 10/31/2020   K 3.9 10/31/2020   CO2 25 10/31/2020   GLUCOSE 97 10/31/2020   BUN 14 10/31/2020   CREATININE 0.85 10/31/2020   BILITOT 1.0 10/31/2020   ALKPHOS 70 10/31/2020   AST 27 10/31/2020   ALT 53 (H) 10/31/2020   PROT 6.6 10/31/2020   ALBUMIN 4.0 10/31/2020   CALCIUM 9.1  10/31/2020   ANIONGAP 5 10/31/2020   Lab Results  Component Value Date   CHOL 160 12/29/2019   Lab Results  Component Value Date   HDL 46 12/29/2019   Lab Results  Component Value Date   LDLCALC 96 12/29/2019   Lab Results  Component Value Date   TRIG 85 12/29/2019   Lab Results  Component Value Date   CHOLHDL 3.5 12/29/2019   Lab Results  Component Value Date   HGBA1C 5.4 10/31/2019      Assessment & Plan:   Problem List Items Addressed This Visit       Cardiovascular and Mediastinum   Benign essential HTN   Relevant Orders   CBC with Differential/Platelet   CMP14+EGFR   Lipid Panel With LDL/HDL Ratio   Atrial fibrillation ([place])    -takes cardizem for rate control; 120 mg qHS and 180 mg qAM        Genitourinary   Benign prostatic hyperplasia with urinary obstruction    -takes alfuzosin 10 mg BID -no issues reported today -followed by Dr. Alyson Ingles -checking PSA  Other   Right temporal headache    -he states he has this daily for the last 4-5 months and it hurts more with talking -he is taking voltaren daily and using a pain cream -unsure of etiology of headache -referral to neuro      Relevant Orders   Ambulatory referral to Neurology   Right shoulder pain    -states he hurt his right shoulder lifting weights in 1988 in the [place] -has pain with extreme extension and external rotation -suggested ortho referral, but he is not interested at this time      Encounter for general adult medical examination with abnormal findings - Primary   Relevant Orders   CBC with Differential/Platelet   CMP14+EGFR   Lipid Panel With LDL/HDL Ratio   TSH   PSA   Hepatitis C antibody   HIV Antibody (routine testing w rflx)   Screening due   Relevant Orders   PSA   Hepatitis C antibody   HIV Antibody (routine testing w rflx)   Other Visit Diagnoses     Hyperlipidemia, unspecified hyperlipidemia type       Relevant Orders   CBC with  Differential/Platelet   CMP14+EGFR   Lipid Panel With LDL/HDL Ratio       No orders of the defined types were placed in this encounter.   Follow-up: Return in about 6 months (around 08/17/2021) for Lab follow-up (A-fib, HLD).    Noreene Larsson, NP

## 2021-02-16 LAB — CMP14+EGFR
ALT: 54 IU/L — ABNORMAL HIGH (ref 0–44)
AST: 53 IU/L — ABNORMAL HIGH (ref 0–40)
Albumin/Globulin Ratio: 2.2 (ref 1.2–2.2)
Albumin: 4.6 g/dL (ref 3.8–4.9)
Alkaline Phosphatase: 83 IU/L (ref 44–121)
BUN/Creatinine Ratio: 13 (ref 9–20)
BUN: 16 mg/dL (ref 6–24)
Bilirubin Total: 0.7 mg/dL (ref 0.0–1.2)
CO2: 23 mmol/L (ref 20–29)
Calcium: 9.2 mg/dL (ref 8.7–10.2)
Chloride: 104 mmol/L (ref 96–106)
Creatinine, Ser: 1.2 mg/dL (ref 0.76–1.27)
Globulin, Total: 2.1 g/dL (ref 1.5–4.5)
Glucose: 81 mg/dL (ref 65–99)
Potassium: 3.9 mmol/L (ref 3.5–5.2)
Sodium: 140 mmol/L (ref 134–144)
Total Protein: 6.7 g/dL (ref 6.0–8.5)
eGFR: 72 mL/min/{1.73_m2} (ref >59)

## 2021-02-16 LAB — CBC WITH DIFFERENTIAL/PLATELET
Basophils Absolute: 0 10*3/uL (ref 0.0–0.2)
Basos: 0 %
EOS (ABSOLUTE): 0.1 10*3/uL (ref 0.0–0.4)
Eos: 1 %
Hematocrit: 38 % (ref 37.5–51.0)
Hemoglobin: 12.6 g/dL — ABNORMAL LOW (ref 13.0–17.7)
Immature Grans (Abs): 0 10*3/uL (ref 0.0–0.1)
Immature Granulocytes: 0 %
Lymphocytes Absolute: 1.6 10*3/uL (ref 0.7–3.1)
Lymphs: 34 %
MCH: 30 pg (ref 26.6–33.0)
MCHC: 33.2 g/dL (ref 31.5–35.7)
MCV: 91 fL (ref 79–97)
Monocytes Absolute: 0.4 10*3/uL (ref 0.1–0.9)
Monocytes: 8 %
Neutrophils Absolute: 2.6 10*3/uL (ref 1.4–7.0)
Neutrophils: 57 %
Platelets: 196 10*3/uL (ref 150–450)
RBC: 4.2 x10E6/uL (ref 4.14–5.80)
RDW: 15.3 % (ref 11.6–15.4)
WBC: 4.7 10*3/uL (ref 3.4–10.8)

## 2021-02-16 LAB — LIPID PANEL WITH LDL/HDL RATIO
Cholesterol, Total: 142 mg/dL (ref 100–199)
HDL: 39 mg/dL — ABNORMAL LOW (ref >39)
LDL Chol Calc (NIH): 92 mg/dL (ref 0–99)
LDL/HDL Ratio: 2.4 ratio (ref 0.0–3.6)
Triglycerides: 50 mg/dL (ref 0–149)
VLDL Cholesterol Cal: 11 mg/dL (ref 5–40)

## 2021-02-16 LAB — HIV ANTIBODY (ROUTINE TESTING W REFLEX): HIV Screen 4th Generation wRfx: NONREACTIVE

## 2021-02-16 LAB — HEPATITIS C ANTIBODY: Hep C Virus Ab: 0.1 s/co ratio (ref 0.0–0.9)

## 2021-02-16 LAB — PSA: Prostate Specific Ag, Serum: 1.1 ng/mL (ref 0.0–4.0)

## 2021-02-16 LAB — TSH: TSH: 1.16 u[IU]/mL (ref 0.450–4.500)

## 2021-02-18 NOTE — Progress Notes (Signed)
Overall, labs look good. LFTs were slightly elevated, but they have been elevated in the past, so it is likely just some fatty liver. We will monitor labs routinely to make sure the liver enzymes don't continue to increase.

## 2021-02-19 ENCOUNTER — Telehealth: Payer: Self-pay

## 2021-02-19 NOTE — Telephone Encounter (Signed)
Spoke with pt about labs. 

## 2021-02-19 NOTE — Telephone Encounter (Signed)
Patient returning call in regards to lab work ph# [phone]

## 2021-03-04 ENCOUNTER — Other Ambulatory Visit: Payer: Self-pay | Admitting: Internal Medicine

## 2021-03-04 DIAGNOSIS — N529 Male erectile dysfunction, unspecified: Secondary | ICD-10-CM

## 2021-04-27 ENCOUNTER — Other Ambulatory Visit: Payer: Self-pay | Admitting: Internal Medicine

## 2021-04-27 DIAGNOSIS — N529 Male erectile dysfunction, unspecified: Secondary | ICD-10-CM

## 2021-05-13 ENCOUNTER — Telehealth: Payer: Self-pay

## 2021-05-13 ENCOUNTER — Other Ambulatory Visit: Payer: Self-pay

## 2021-05-13 MED ORDER — SIMVASTATIN 10 MG PO TABS: 10.0000 mg | ORAL_TABLET | Freq: Every day | ORAL | 0 refills | Status: DC

## 2021-05-13 NOTE — Telephone Encounter (Signed)
Patient called need med refills  simvastatin (ZOCOR) 10 MG tablet   Pharmacy: [facility]

## 2021-05-14 ENCOUNTER — Encounter: Payer: Self-pay | Admitting: Neurology

## 2021-05-14 ENCOUNTER — Ambulatory Visit (INDEPENDENT_AMBULATORY_CARE_PROVIDER_SITE_OTHER): Payer: 59 | Admitting: Neurology

## 2021-05-14 VITALS — BP 137/84 | HR 74 | Ht 72.0 in | Wt 200.0 lb

## 2021-05-14 DIAGNOSIS — M542 Cervicalgia: Secondary | ICD-10-CM | POA: Diagnosis not present

## 2021-05-14 DIAGNOSIS — R519 Headache, unspecified: Secondary | ICD-10-CM | POA: Diagnosis not present

## 2021-05-14 MED ORDER — NORTRIPTYLINE HCL 25 MG PO CAPS: 50.0000 mg | ORAL_CAPSULE | Freq: Every day | ORAL | 11 refills | Status: DC

## 2021-05-14 NOTE — Progress Notes (Signed)
Chief Complaint  Patient presents with   New Patient (Initial Visit)    Rm 14, alone c/o daily headaches, onset 8 months ago, states he is concerned it is related to his carotid artery pain       ASSESSMENT AND PLAN  William Kim is a 55 y.o. male  New onset persistent right-sided headache Right neck pain  MRI of brain to rule out structural abnormality,  CT neck soft tissue with contrast  Start preventive medication nortriptyline 25 titrating to 50 mg every night as preventive medication, Aleve as needed    DIAGNOSTIC DATA (LABS, IMAGING, TESTING) - I reviewed patient records, labs, notes, testing and imaging myself where available.   MEDICAL HISTORY:  William Kim, is a 55 year old male, seen in request by his primary care nurse practitioner William Kim, for evaluation of right temporal headache, right neck discomfort, initial evaluation was May 14, 2021  I reviewed and summarized the referring note. PMHX. 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, which has progressed since 2017, also evidence of ACDF at C6-7, C7-T1  He denies a previous history of headache, but since beginning of 2022, he began to have frequent headaches, starting from right jaw area, radiating towards the right temporoparietal region, daily variable degree, can have 6 out of 10 pulsating pain, as if water balloon is going to burst open, for this reason, he has been taking Aleve 1 tablet twice a day daily over the past few month  Since he hurt his friend passed away with sudden  brain aneurysm, he has been constantly worried about above symptoms likely represent brain structural abnormality.  He denies focal signs, no hearing loss, no visual loss, no lateralized motor or sensory deficit. PHYSICAL EXAM:   Vitals:   05/14/21 1540  BP: 137/84  Pulse: 74  Weight: 200 lb (90.7 kg)  Height: 6' (1.829 m)   Not recorded     Body mass index is 27.12 kg/m.  PHYSICAL EXAMNIATION:  Gen: NAD, conversant, well nourised, well groomed                     Cardiovascular: Regular rate rhythm, no peripheral edema, warm, nontender. Eyes: Conjunctivae clear without exudates or hemorrhage Neck: Supple, no carotid bruits.  There is a palpable right submandibular area lymph node, movable, about 1cm Pulmonary: Clear to auscultation bilaterally   NEUROLOGICAL EXAM:  MENTAL STATUS: Speech:    Speech is normal; fluent and spontaneous with normal comprehension.  Cognition:     Orientation to time, place and person     Normal recent and remote memory     Normal Attention span and concentration     Normal Language, naming, repeating,spontaneous speech     Fund of knowledge   CRANIAL NERVES: CN II: Visual fields are full to confrontation. Pupils are round equal and briskly reactive to light. CN III, IV, VI: extraocular movement are normal. No ptosis. CN V: Facial sensation is intact to light touch CN VII: Face is symmetric with normal eye closure  CN VIII: Hearing is normal to  causal conversation. CN IX, X: Phonation is normal. CN XI: Head turning and shoulder shrug are intact  MOTOR: There is no pronator drift of out-stretched arms. Muscle bulk and tone are normal. Muscle strength is normal.  REFLEXES: Reflexes are 2+ and symmetric at the biceps, triceps, knees, and ankles. Plantar responses are flexor.  SENSORY: Intact to light touch, pinprick and vibratory sensation are intact in fingers and toes.  COORDINATION: There is no trunk or limb dysmetria  noted.  GAIT/STANCE: Posture is normal. Gait is steady with normal steps, base, arm swing, and turning. Heel and toe walking are normal. Tandem gait is normal.  Romberg is absent.  REVIEW OF SYSTEMS:  Full 14 system review of systems performed and notable only for as above All other review of systems were negative.   ALLERGIES: No Known Allergies  HOME MEDICATIONS: Current Outpatient Medications  Medication Sig Dispense Refill   alfuzosin (UROXATRAL) 10 MG 24 hr tablet Take 1 tablet (10 mg total) by mouth in the morning and at bedtime. BID 180 tablet 3   Ascorbic Acid (VITAMIN C PO) Take 1 tablet by mouth daily.     aspirin EC 81 MG tablet Take 81 mg by mouth daily.     diclofenac (VOLTAREN) 75 MG EC tablet Take 75 mg by mouth 2 (two) times daily as needed for mild pain.     diltiazem (CARDIZEM CD) 120 MG 24 hr capsule TAKE 1 CAPSULE AT BEDTIME (TAKE 180 MG IN THE MORNING) (Patient taking differently: Take 120 mg by mouth at bedtime.) 90 capsule 3   diltiazem (CARDIZEM CD) 180 MG 24 hr capsule TAKE 1 CAPSULE EVERY MORNING (Patient taking differently: Take 180 mg by mouth in the morning.) 90 capsule 3   lidocaine (LIDODERM) 5 % Place 1 patch onto the skin daily as needed (pain).     sildenafil (VIAGRA) 100 MG tablet TAKE 1 TABLET (100 MG TOTAL) BY MOUTH DAILY AS NEEDED. 10 tablet 0   simvastatin (ZOCOR) 10 MG tablet Take 1 tablet (10 mg total) by mouth at bedtime. 90 tablet 0   valACYclovir (VALTREX) 500 MG tablet TAKE 1 TABLET DAILY 90 tablet 3   No current facility-administered medications for this visit.    PAST MEDICAL HISTORY: Past Medical History:  Diagnosis Date   A-fib ([place])    Anemia    Phreesia 12/26/2019   Anxiety    Atrial fibrillation ([place])    Cervicalgia    Encounter for screening colonoscopy 07/02/2018   High cholesterol    Hypertension    Obesity (BMI 30-39.9) 05/31/2018   OSA (obstructive sleep apnea) 05/31/2018   Severe OSA with AHI 79/h with significant  oxygen desaturations as low as 58%.   Sleep apnea     PAST SURGICAL HISTORY: Past Surgical History:  Procedure Laterality Date   COLONOSCOPY WITH PROPOFOL N/A 08/02/2018   Procedure: COLONOSCOPY WITH PROPOFOL;  Surgeon: William Dolin, MD;  [facility];  Service: Endoscopy;  Laterality: N/A;  12:15pm   NECK SURGERY     NERVE SURGERY     None      FAMILY HISTORY: Family History  Problem Relation Age of Onset   Healthy Mother    Healthy Father    Colon cancer Neg Hx    Colon polyps Neg Hx     SOCIAL HISTORY: Social History   Socioeconomic History   Marital status: Widowed    Spouse name: Archie Patten    Number of children: 2   Years  of education: Not on file   Highest education level: 12th grade  Occupational History   Occupation: welder  Tobacco Use   Smoking status: Former    Types: Cigarettes   Smokeless tobacco: Never   Tobacco comments:    quit smoking in 2012/2013, quit smoking August 16th  Vaping Use   Vaping Use: Never used  Substance and Sexual Activity   Alcohol use: Not Currently    Comment: stopped 3-4 months ago   Drug use: No   Sexual activity: Not on file  Other Topics Concern   Not on file  Social History Narrative   Lives with wife Archie Patten married 3/10 60 years   2 children   Son 48 lives in [place], 1 grandbaby 55 months old 2020   Daughter 62 she is living at home      Enjoys fishing      Diet: eats veggies and salmon, avoid red meats   Caffeine: does not drink    Water: 2 bottles day       Wear seat belt    Smoke detectors   Does not use phone while driving    Social Determinants of Radio broadcast assistant Strain: Not on file  Food Insecurity: Not on file  Transportation Needs: Not on file  Physical Activity: Not on file  Stress: Not on file  Social Connections: Not on file  Intimate Partner Violence: Not on file      Marcial Pacas, M.D. Ph.D.  [facility] [address] [place], [place]  [address] Ph: [phone] Fax: [phone]  CC:  William Larsson, NP [address]  [address] [place],  [place] [address]  William Larsson, NP

## 2021-05-15 ENCOUNTER — Telehealth: Payer: Self-pay | Admitting: Neurology

## 2021-05-15 NOTE — Telephone Encounter (Signed)
cgna order sent to [facility], they will obtain auth for both studies. They also will reach out to the patient to schedule.

## 2021-05-20 ENCOUNTER — Ambulatory Visit
Admission: RE | Admit: 2021-05-20 | Discharge: 2021-05-20 | Disposition: A | Discharge status: Home / Self Care | Payer: 59 | Source: Ambulatory Visit | Attending: Neurology | Admitting: Neurology

## 2021-05-20 ENCOUNTER — Other Ambulatory Visit: Payer: Self-pay

## 2021-05-20 DIAGNOSIS — M542 Cervicalgia: Secondary | ICD-10-CM

## 2021-05-20 DIAGNOSIS — R519 Headache, unspecified: Secondary | ICD-10-CM

## 2021-05-20 MED ORDER — IOPAMIDOL (ISOVUE-300) INJECTION 61%
75.0000 mL | Freq: Once | INTRAVENOUS | Status: AC | PRN
  Administered 2021-05-20: 75 mL via INTRAVENOUS

## 2021-06-12 ENCOUNTER — Ambulatory Visit (HOSPITAL_COMMUNITY)
Admission: RE | Admit: 2021-06-12 | Discharge: 2021-06-12 | Disposition: A | Discharge status: Home / Self Care | Payer: 59 | Source: Ambulatory Visit | Attending: Nurse Practitioner | Admitting: Nurse Practitioner

## 2021-06-12 ENCOUNTER — Encounter (HOSPITAL_COMMUNITY): Payer: Self-pay | Admitting: Nurse Practitioner

## 2021-06-12 ENCOUNTER — Other Ambulatory Visit: Payer: Self-pay

## 2021-06-12 VITALS — BP 128/92 | HR 72 | Ht 72.0 in | Wt 199.2 lb

## 2021-06-12 DIAGNOSIS — I480 Paroxysmal atrial fibrillation: Secondary | ICD-10-CM | POA: Diagnosis not present

## 2021-06-12 DIAGNOSIS — G4730 Sleep apnea, unspecified: Secondary | ICD-10-CM | POA: Diagnosis not present

## 2021-06-12 DIAGNOSIS — Z79899 Other long term (current) drug therapy: Secondary | ICD-10-CM | POA: Insufficient documentation

## 2021-06-12 DIAGNOSIS — I10 Essential (primary) hypertension: Secondary | ICD-10-CM | POA: Insufficient documentation

## 2021-06-12 DIAGNOSIS — F419 Anxiety disorder, unspecified: Secondary | ICD-10-CM | POA: Insufficient documentation

## 2021-06-12 DIAGNOSIS — F1090 Alcohol use, unspecified, uncomplicated: Secondary | ICD-10-CM | POA: Diagnosis not present

## 2021-06-12 NOTE — Progress Notes (Signed)
Primary Care Physician: Noreene Larsson, NP Referring Physician: [facility] ER f/u   William Kim is a 55 y.o. male with a h/o HTN, alcohol use and untreated probable sleep apnea. It is documented in Epic in 2014 that he was treated for afib in the ER. The pt reports that he would have issues with irregular heart beat off and on since then but episodes would be short lived. He noticed the episodes were more frequent over the last few weeks and recently present to the ER, 7/31, with afib with RVR.He was given IV diltiazem and converted. Discharged home on xarelto 20 mg for a chadsvasc score of 1 and daily diltiazem 120 mg a day.  In the afib clinic, 8/3, He is in [place]. No further afib. He was drinking 6 alcoholic drinks a night and was told in the ER to diminish alcohol intake. He also reports significant snoring/ apnea and about 8 years ago, had a sleep study but when the cpap mask was applied, he did not like the feeling and left the test. 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 He denies tobacco or caffeine use.  F/u in afib clinic 8/27. He is in [place]. Has noted a few short lived episodes of irregular heart beat. He will finish xarelto for a chadsvasc score of 1(htn). Echo showed Mild LVH. He has tolerated addition of diltiazem daily. Sleep study is pending. He has cut back on alcohol but has not had cessation.  F/u afib clinic, he had repeated sleep study and is pending results of test. Still  notices some episodes of afib lasting around 15-20 mins. Still has cut down on alcohol but two nights ago had 2 beers and a margarita. He is exercising more and has lost 4 lbs. He is now off anticoagulation.  F/u in afib clinic, 05/15/17. He continues with alcohol abuse, drinking a beer and a margarita every night. He has been found to have sleep apnea and is pending getting his CPAP. He notices some irregular heart rate intermittently but it sounds more consistent with premature contractions.   F/u in afib  clinic, 06/13/19. Was referred back from PCP to refill diltiazem. I have not seen since 2018.  Pt has been struggling for several months of anxiety/panic attacks and has been to the ER 2x in November with feeling of heart racing, impending doom.  He states that he had shoulder surgery in June and has had more time at the house, being on short term disability, watching TV, with the constant issues with covid reporting, contributing to anxiety. He is not due to go back to work until after 1//21. When he has his attacks he is aware of impending doom, heart racing. No ekg's in the ER showed afib. I do not see any evidence of afib since I last saw pt. He has also lost from 230 to 180 lb, which he contributes to anxiety decreasing his appetite. He saw his PCP recently and was referred to behavioral health. He has not heard back re appointment date yet. He was given hydroxyzine 25 mg  to use when necessary.   F/u in afib clinic, 06/12/20. He has had a good year without any appreciable afib. He was placed on Wellbutrin last year with his anxiety now  under good control. He is trying to watch what he eats and exercise on his treadmill several times a week. BP elevated today, states yesterday at [facility] visit, it was in range.  F/u  in afib clinic 06/12/21. He reports that his wife died of a one car auto accident,in July, left the road and hit a tree and was dead at the scene. He notices some heart racing intermittently. At the same time he was asking to stop pm diltiazem.  He is agreeable for a zio patch to be placed before I make the decision to stop pm med.   Today, he denies symptoms of palpitations, chest pain, shortness of breath, orthopnea, PND, lower extremity edema, dizziness, presyncope, syncope, or neurologic sequela. The patient is tolerating medications without difficulties and is otherwise without complaint today.   Past Medical History:  Diagnosis Date   A-fib ([place])    Anemia    Phreesia 12/26/2019    Anxiety    Atrial fibrillation ([place])    Cervicalgia    Encounter for screening colonoscopy 07/02/2018   High cholesterol    Hypertension    Obesity (BMI 30-39.9) 05/31/2018   OSA (obstructive sleep apnea) 05/31/2018   Severe OSA with AHI 79/h with significant oxygen desaturations as low as 58%.   Sleep apnea    Past Surgical History:  Procedure Laterality Date   COLONOSCOPY WITH PROPOFOL N/A 08/02/2018   Procedure: COLONOSCOPY WITH PROPOFOL;  Surgeon: Daneil Dolin, MD;  [facility];  Service: Endoscopy;  Laterality: N/A;  12:15pm   NECK SURGERY     NERVE SURGERY     None      Current Outpatient Medications  Medication Sig Dispense Refill   alfuzosin (UROXATRAL) 10 MG 24 hr tablet Take 1 tablet (10 mg total) by mouth in the morning and at bedtime. BID (Patient taking differently: Take 10 mg by mouth daily. BID) 180 tablet 3   Ascorbic Acid (VITAMIN C PO) Take 1 tablet by mouth daily.     aspirin EC 81 MG tablet Take 81 mg by mouth daily.     buPROPion (WELLBUTRIN XL) 300 MG 24 hr tablet TAKE ONE TABLET BY MOUTH EVERY MORNING FOR MOOD    TAKE AT BREAKFAST     diltiazem (CARDIZEM CD) 120 MG 24 hr capsule TAKE 1 CAPSULE AT BEDTIME (TAKE 180 MG IN THE MORNING) 90 capsule 3   diltiazem (CARDIZEM CD) 180 MG 24 hr capsule TAKE 1 CAPSULE EVERY MORNING 90 capsule 3   sildenafil (VIAGRA) 100 MG tablet TAKE 1 TABLET (100 MG TOTAL) BY MOUTH DAILY AS NEEDED. 10 tablet 0   simvastatin (ZOCOR) 10 MG tablet Take 1 tablet (10 mg total) by mouth at bedtime. 90 tablet 0   valACYclovir (VALTREX) 500 MG tablet TAKE 1 TABLET DAILY 90 tablet 3   No current facility-administered medications for this encounter.    No Known Allergies  Social History   Socioeconomic History   Marital status: Widowed    Spouse name: Archie Patten    Number of children: 2   Years of education: Not on file   Highest education level: 12th grade  Occupational History   Occupation: welder  Tobacco Use   Smoking  status: Former    Types: Cigarettes   Smokeless tobacco: Never   Tobacco comments:    quit smoking in 2012/2013, quit smoking August 16th  Vaping Use   Vaping Use: Never used  Substance and Sexual Activity   Alcohol use: Not Currently    Comment: stopped 3-4 months ago   Drug use: No   Sexual activity: Not on file  Other Topics Concern   Not on file  Social History Narrative  Lives with wife Archie Patten married 3/10 83 years   2 children   Son 85 lives in [place], 1 grandbaby 38 months old 2020   Daughter 47 she is living at home      Enjoys fishing      Diet: eats veggies and salmon, avoid red meats   Caffeine: does not drink    Water: 2 bottles day       Wear seat belt    Smoke detectors   Does not use phone while driving    Social Determinants of Health   Financial Resource Strain: Not on file  Food Insecurity: Not on file  Transportation Needs: Not on file  Physical Activity: Not on file  Stress: Not on file  Social Connections: Not on file  Intimate Partner Violence: Not on file    Family History  Problem Relation Age of Onset   Healthy Mother    Healthy Father    Colon cancer Neg Hx    Colon polyps Neg Hx     ROS- All systems are reviewed and negative except as per the HPI above  Physical Exam: Vitals:   06/12/21 1547  BP: (!) 128/92  Pulse: 72  Weight: 90.4 kg  Height: 6' (1.829 m)   Wt Readings from Last 3 Encounters:  06/12/21 90.4 kg  05/14/21 90.7 kg  02/14/21 90.3 kg    Labs: Lab Results  Component Value Date   NA 140 02/15/2021   K 3.9 02/15/2021   CL 104 02/15/2021   CO2 23 02/15/2021   GLUCOSE 81 02/15/2021   BUN 16 02/15/2021   CREATININE 1.20 02/15/2021   CALCIUM 9.2 02/15/2021   No results found for: INR Lab Results  Component Value Date   CHOL 142 02/15/2021   HDL 39 (L) 02/15/2021   LDLCALC 92 02/15/2021   TRIG 50 02/15/2021     GEN- The patient is well appearing, alert and oriented x 3 today.   Head- normocephalic,  atraumatic Eyes-  Sclera clear, conjunctiva pink Ears- hearing intact Oropharynx- clear Neck- supple, no JVP Lymph- no cervical lymphadenopathy Lungs- Clear to ausculation bilaterally, normal work of breathing Heart- Regular rate and rhythm, no murmurs, rubs or gallops, PMI not laterally displaced GI- soft, NT, ND, + BS Extremities- no clubbing, cyanosis, or edema MS- no significant deformity or atrophy Skin- no rash or lesion Psych- euthymic mood, full affect Neuro- strength and sensation are intact  EKG- NSR at 72 bpm,  pr int 182 ms, qrs in 86 ms, qtc 405  ms Epic records reviewed Echo- 2018-Study Conclusions   - Left ventricle: The cavity size was normal. Wall thickness was   increased in a pattern of mild LVH. Systolic function was normal.   The estimated ejection fraction was in the range of 60% to 65%.   Low normal GLPSS at -18%. Wall motion was normal; there were no   regional wall motion abnormalities. Doppler parameters are   consistent with abnormal left ventricular relaxation (grade 1   diastolic dysfunction). The E/e&' ratio is between 8-15,   suggesting indeterminate LV filling pressure. - Mitral valve: Mildly thickened leaflets . There was trivial   regurgitation. - Left atrium: The atrium was normal in size. - Inferior vena cava: The vessel was normal in size. The   respirophasic diameter changes were in the normal range (>= 50%),   consistent with normal central venous pressure.   Impressions:   - LVEF 60-65%, mild LVH, normal wall thickness, normal wall  motion,   grade 1 DD, indeterminate LV filling pressure, normal LA Size,   normal IVC.    Assessment and Plan: 1. Paroxysmal afib I have seen no evidence of afib since 2018 But pt is c/o intermittent  heart racing   Will place zio patch  Continue diltiazem 180 mg am and 120 mg pm, but if no afib, he would like to stop the pm dose  Off xarelto, with a chadsvasc score of 1, continue with  81 mg ASA  2.  HTN Stable    3. Lifestyle measures No alcohol use  He is trying to eat better at this time, less salt, healthy choices He is exercising as well    4. Anxiety Under control on Wellbutrin  Will call with results of monitor  otherwise f/u in one year   Butch Penny C. Geroge Gilliam, [facility] [address] [place], [place] [address] [phone]

## 2021-06-21 ENCOUNTER — Other Ambulatory Visit: Payer: Self-pay | Admitting: Internal Medicine

## 2021-06-21 DIAGNOSIS — N529 Male erectile dysfunction, unspecified: Secondary | ICD-10-CM

## 2021-06-28 ENCOUNTER — Ambulatory Visit (INDEPENDENT_AMBULATORY_CARE_PROVIDER_SITE_OTHER): Payer: 59 | Admitting: Urology

## 2021-06-28 ENCOUNTER — Other Ambulatory Visit: Payer: Self-pay

## 2021-06-28 VITALS — BP 121/73 | HR 77 | Wt 200.0 lb

## 2021-06-28 DIAGNOSIS — R351 Nocturia: Secondary | ICD-10-CM | POA: Diagnosis not present

## 2021-06-28 DIAGNOSIS — N401 Enlarged prostate with lower urinary tract symptoms: Secondary | ICD-10-CM | POA: Diagnosis not present

## 2021-06-28 DIAGNOSIS — N138 Other obstructive and reflux uropathy: Secondary | ICD-10-CM

## 2021-06-28 LAB — URINALYSIS, ROUTINE W REFLEX MICROSCOPIC
Bilirubin, UA: NEGATIVE
Glucose, UA: NEGATIVE
Ketones, UA: NEGATIVE
Leukocytes,UA: NEGATIVE
Nitrite, UA: NEGATIVE
Protein,UA: NEGATIVE
RBC, UA: NEGATIVE
Specific Gravity, UA: 1.015 (ref 1.005–1.030)
Urobilinogen, Ur: 0.2 mg/dL (ref 0.2–1.0)
pH, UA: 6 (ref 5.0–7.5)

## 2021-06-28 MED ORDER — ALFUZOSIN HCL ER 10 MG PO TB24: 10.0000 mg | ORAL_TABLET | Freq: Every day | ORAL | 11 refills | Status: DC

## 2021-06-28 NOTE — Progress Notes (Signed)
Urological Symptom Review  Patient is experiencing the following symptoms: none  Review of Systems  Gastrointestinal (upper)  : Negative for upper GI symptoms  Gastrointestinal (lower) : Negative for lower GI symptoms  Constitutional : Negative for symptoms  Skin: Negative for skin symptoms  Eyes: Negative for eye symptoms  Ear/Nose/Throat : Sinus problems  Hematologic/Lymphatic: Negative for Hematologic/Lymphatic symptoms  Cardiovascular : Negative for cardiovascular symptoms  Respiratory : Negative for respiratory symptoms  Endocrine: Negative for endocrine symptoms  Musculoskeletal: Negative for musculoskeletal symptoms  Neurological: Negative for neurological symptoms  Psychologic: Negative for psychiatric symptoms

## 2021-06-28 NOTE — Progress Notes (Signed)
06/28/2021 12:49 PM   Delfino Lovett Jamey Reas May 13, 1966 [id number]  Referring provider: Perlie Mayo, NP [address] [place],   [id number]  No chief complaint on file.  HPI: 12/25/20 Mr Komatsu is a 55yo here for followup for BPH with Nocturia. His nocturia has decreased to 1-2x based on fluid management. He notes his pelvic pain has improved since increasing uroxatral to 10mg  BID. Urine stream strong. NO hesitancy. No dysuria or hematuria. NO other complaints today  06/28/21  Pt presents for follow up of BPH with nocturia, currently on uroxatral 10mg  HS only although rx for BID. Pt states he is doing well without concerns. Urine stream strong. NO hesitancy. No dysuria or hematuria. No pelvic pain  PSA 1.1 02/15/21          0.7  12/29/19  IPSS today=7, voids twice at night, QOL=2, mostly satisfied UA clear  PMH: Past Medical History:  Diagnosis Date   A-fib ([place])    Anemia    Phreesia 12/26/2019   Anxiety    Atrial fibrillation ([place])    Cervicalgia    Encounter for screening colonoscopy 07/02/2018   High cholesterol    Hypertension    Obesity (BMI 30-39.9) 05/31/2018   OSA (obstructive sleep apnea) 05/31/2018   Severe OSA with AHI 79/h with significant oxygen desaturations as low as 58%.   Sleep apnea     Surgical History: Past Surgical History:  Procedure Laterality Date   COLONOSCOPY WITH PROPOFOL N/A 08/02/2018   Procedure: COLONOSCOPY WITH PROPOFOL;  Surgeon: Daneil Dolin, MD;  Location: [facility];  Service: Endoscopy;  Laterality: N/A;  12:15pm   NECK SURGERY     NERVE SURGERY     None      Home Medications:  Allergies as of 06/28/2021   No Known Allergies      Medication List        Accurate as of June 28, 2021 12:49 PM. If you have any questions, ask your nurse or doctor.          alfuzosin 10 MG 24 hr tablet Commonly known as: UROXATRAL Take 1 tablet (10 mg total) by mouth in the morning and at bedtime. BID What changed: when to take  this   aspirin EC 81 MG tablet Take 81 mg by mouth daily.   buPROPion 300 MG 24 hr tablet Commonly known as: WELLBUTRIN XL TAKE ONE TABLET BY MOUTH EVERY MORNING FOR MOOD    TAKE AT BREAKFAST   diltiazem 180 MG 24 hr capsule Commonly known as: CARDIZEM CD TAKE 1 CAPSULE EVERY MORNING   diltiazem 120 MG 24 hr capsule Commonly known as: CARDIZEM CD TAKE 1 CAPSULE AT BEDTIME (TAKE 180 MG IN THE MORNING)   sildenafil 100 MG tablet Commonly known as: VIAGRA TAKE 1 TABLET (100 MG TOTAL) BY MOUTH DAILY AS NEEDED.   simvastatin 10 MG tablet Commonly known as: ZOCOR Take 1 tablet (10 mg total) by mouth at bedtime.   valACYclovir 500 MG tablet Commonly known as: VALTREX TAKE 1 TABLET DAILY   VITAMIN C PO Take 1 tablet by mouth daily.        Allergies: No Known Allergies  Family History: Family History  Problem Relation Age of Onset   Healthy Mother    Healthy Father    Colon cancer Neg Hx    Colon polyps Neg Hx     Social History:  reports that he has quit smoking. His smoking use included cigarettes. He has never  used smokeless tobacco. He reports that he does not currently use alcohol. He reports that he does not use drugs.  ROS: All other review of systems were reviewed and are negative except what is noted above in HPI  Physical Exam: BP 121/73    Pulse 77    Wt 200 lb (90.7 kg)    BMI 27.12 kg/m   Constitutional:  Alert and oriented, No acute distress. HEENT: [place] AT,Trachea midline Cardiovascular: No clubbing, cyanosis, or edema. Respiratory: Normal respiratory effort, no increased work of breathing. GI: Abdomen is soft, nontender, nondistended, no abdominal masses GU: No CVA tenderness.  Skin: No rashes Neurologic: Grossly intact, no focal deficits, moving all 4 extremities. Psychiatric: Normal mood and affect.  Laboratory Data: Lab Results  Component Value Date   WBC 4.7 02/15/2021   HGB 12.6 (L) 02/15/2021   HCT 38.0 02/15/2021   MCV 91 02/15/2021    PLT 196 02/15/2021    Lab Results  Component Value Date   CREATININE 1.20 02/15/2021    Lab Results  Component Value Date   PSA 0.7 12/29/2019    No results found for: TESTOSTERONE  Lab Results  Component Value Date   HGBA1C 5.4 10/31/2019    Urinalysis    Component Value Date/Time   COLORURINE YELLOW 10/31/2020 1134   APPEARANCEUR Clear 11/13/2020 1545   LABSPEC 1.011 10/31/2020 1134   PHURINE 9.0 (H) 10/31/2020 1134   GLUCOSEU Negative 11/13/2020 1545   HGBUR NEGATIVE 10/31/2020 1134   BILIRUBINUR Negative 11/13/2020 1545   KETONESUR NEGATIVE 10/31/2020 1134   PROTEINUR Negative 11/13/2020 1545   PROTEINUR NEGATIVE 10/31/2020 1134   UROBILINOGEN 0.2 10/30/2020 1634   NITRITE Negative 11/13/2020 1545   NITRITE NEGATIVE 10/31/2020 1134   LEUKOCYTESUR Negative 11/13/2020 1545   LEUKOCYTESUR NEGATIVE 10/31/2020 1134    Lab Results  Component Value Date   LABMICR Comment 11/13/2020     Results for orders placed during the hospital encounter of 10/31/20  CT Renal Stone Study  Narrative CLINICAL DATA:  LEFT flank pain since 10/27/2020, question kidney stone. No history of kidney stones. Denies hematuria.  EXAM: CT ABDOMEN AND PELVIS WITHOUT CONTRAST  TECHNIQUE: Multidetector CT imaging of the abdomen and pelvis was performed following the standard protocol without IV contrast. Sagittal and coronal MPR images reconstructed from axial data set. No oral contrast administered.  COMPARISON:  None  FINDINGS: Lower chest: Minimal RIGHT basilar atelectasis. Lung bases otherwise clear.  Hepatobiliary: Gallbladder and liver normal appearance  Pancreas: Normal appearance  Spleen: Normal appearance  Adrenals/Urinary Tract: Adrenal glands, kidneys, ureters, and bladder normal appearance.  Stomach/Bowel: Normal appendix. Scattered stool throughout colon. Stomach decompressed. Radiopacity within the small bowel loop in the LEFT mid abdomen image 55  question medication tablet. Bowel loops otherwise unremarkable.  Vascular/Lymphatic: Atherosclerotic calcifications aorta and iliac arteries without aneurysm. No adenopathy.  Reproductive: Prostate gland 4.3 x 3.3 x 3.1 cm (volume = 23 cm^3). Seminal vesicles unremarkable.  Other: Small umbilical hernia containing fat. No free air or free fluid. No acute inflammatory process.  Musculoskeletal: Degenerative disc disease changes L5-S1. No acute osseous findings.  IMPRESSION: Small umbilical hernia containing fat.  Minimal prostatic enlargement.  No acute intra-abdominal or intrapelvic abnormalities.  No cause for LEFT flank pain identified.  Aortic Atherosclerosis (ICD10-I70.0).   Electronically Signed By: Lavonia Dana M.D. On: 10/31/2020 08:45   Assessment & Plan:   Benign prostatic hyperplasia with urinary obstruction - Plan: Urinalysis, Routine w reflex microscopic  Nocturia  Patient will  continue Uroxatrol 10 mg at bedtime we will follow-up in 1 year for further evaluation.  And will follow up in 1 year for reevaluation.  Return in about 1 year (around 06/28/2022) for BPH.  [place], Berneice Heinrich, PA-C  [facility] [place]

## 2021-07-09 ENCOUNTER — Encounter: Payer: Self-pay | Admitting: Urology

## 2021-07-09 NOTE — Patient Instructions (Signed)

## 2021-07-19 ENCOUNTER — Other Ambulatory Visit (HOSPITAL_COMMUNITY): Payer: Self-pay | Admitting: *Deleted

## 2021-08-01 ENCOUNTER — Other Ambulatory Visit: Payer: Self-pay | Admitting: Physician Assistant

## 2021-08-05 ENCOUNTER — Other Ambulatory Visit (HOSPITAL_COMMUNITY): Payer: Self-pay | Admitting: Nurse Practitioner

## 2021-08-21 ENCOUNTER — Ambulatory Visit: Payer: 59 | Admitting: Nurse Practitioner

## 2021-08-22 ENCOUNTER — Ambulatory Visit: Payer: 59 | Admitting: Neurology

## 2021-08-22 IMAGING — CT CT HEAD W/O CM
3 series · 16 of 47 positions shown, 19 images · non-contrast
Comparison: None.

CLINICAL DATA: Hypertension and headaches

EXAM:
CT HEAD WITHOUT CONTRAST
TECHNIQUE: Contiguous axial images were obtained from the base of the skull
through the vertex without intravenous contrast.

[Series 2: head w o · axial · 0.46mm/px · z∈[+1287,+1422]mm · 10 of 33 slices shown, 13 images]
[im 3/33  brain]
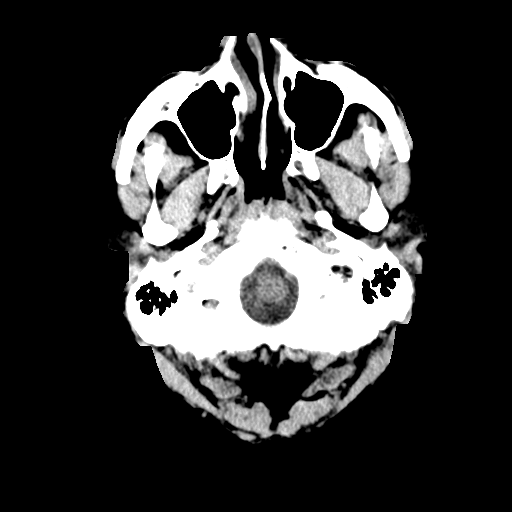
[im 3/33  bone]
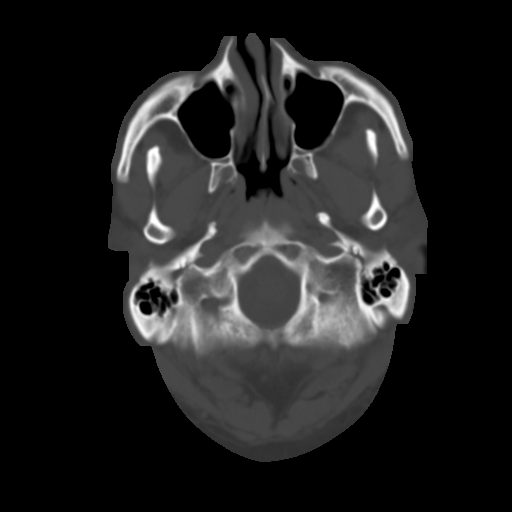
[im 6/33  brain]
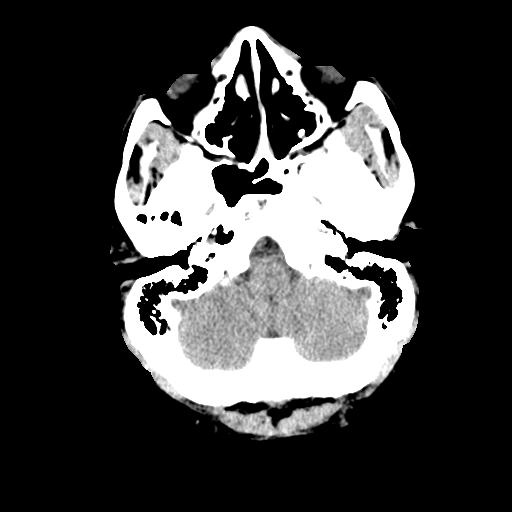
[im 9/33  brain]
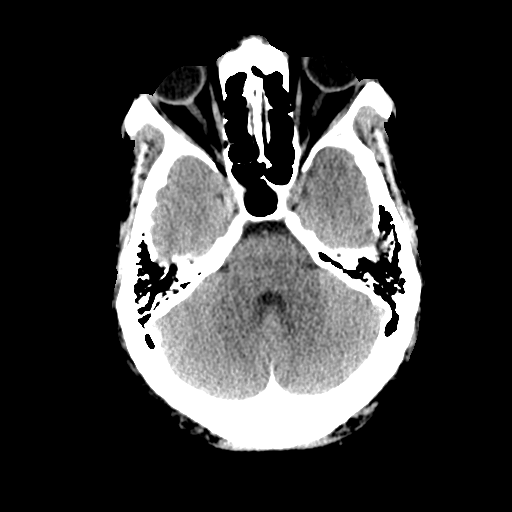
[im 12/33  brain]
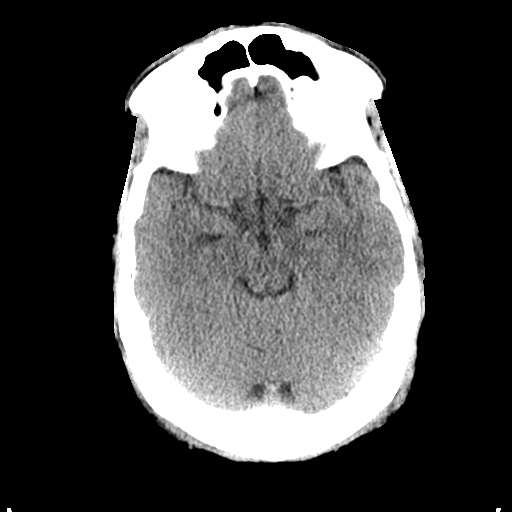
[im 15/33  brain]
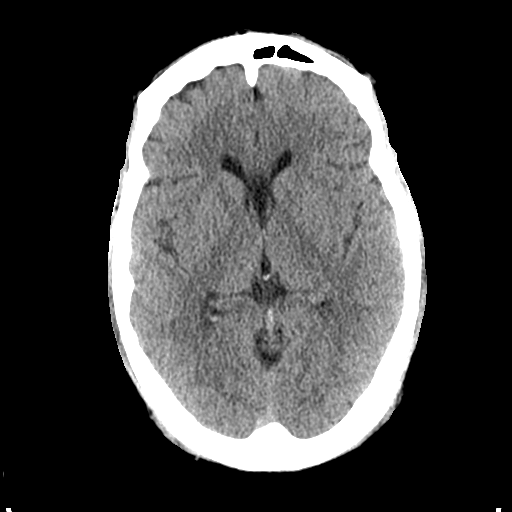
[im 15/33  bone]
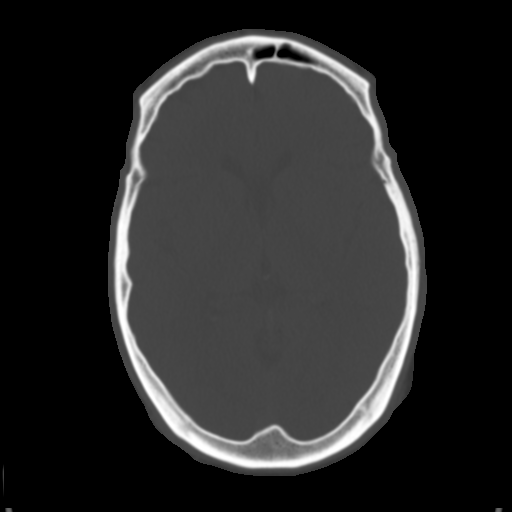
[im 18/33  brain]
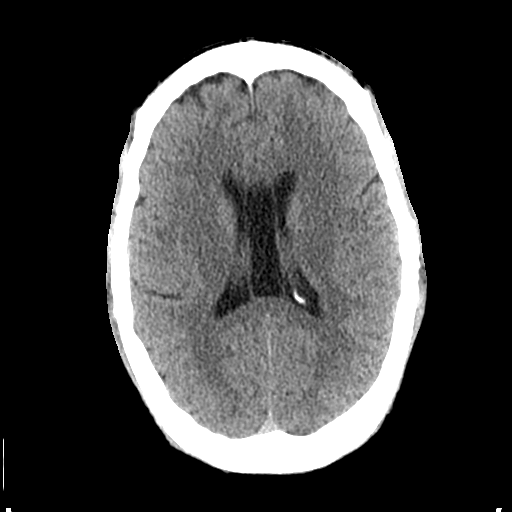
[im 21/33  brain]
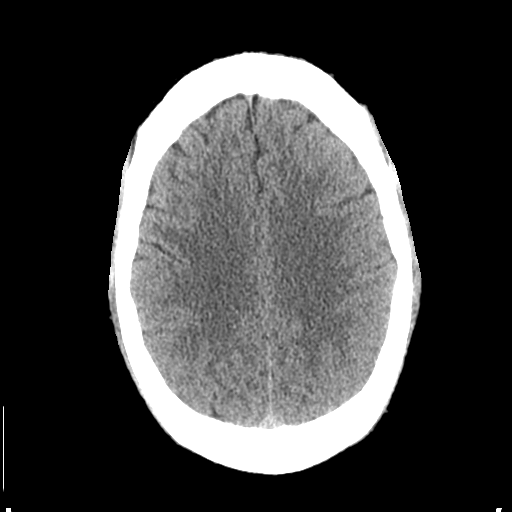
[im 25/33  brain]
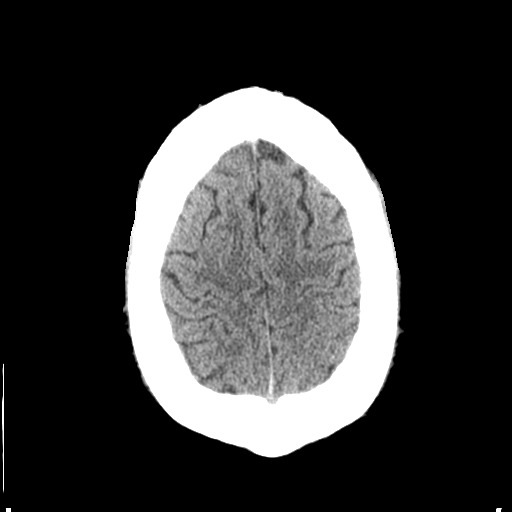
[im 27/33  brain]
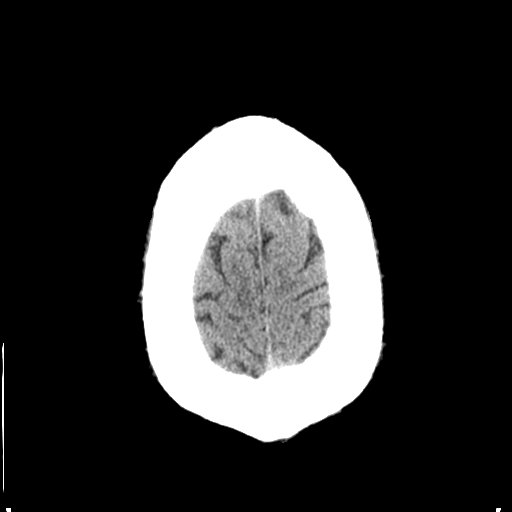
[im 27/33  bone]
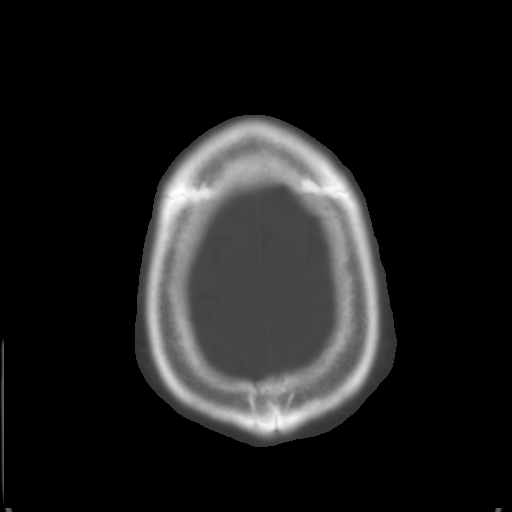
[im 30/33  brain]
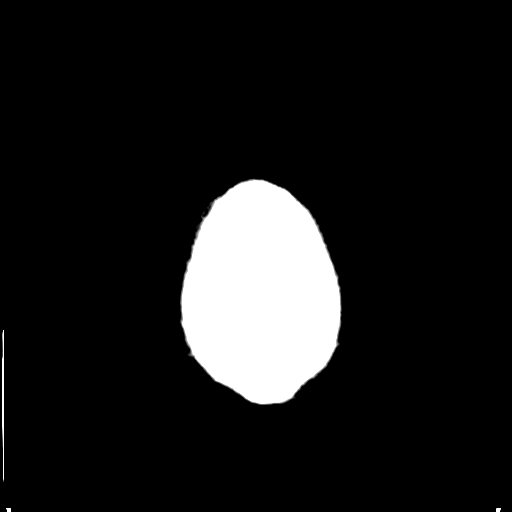

[Series 4: coronal soft · coronal · 0.32mm/px · 3 of 82 slices shown]
[im 28/82  brain]
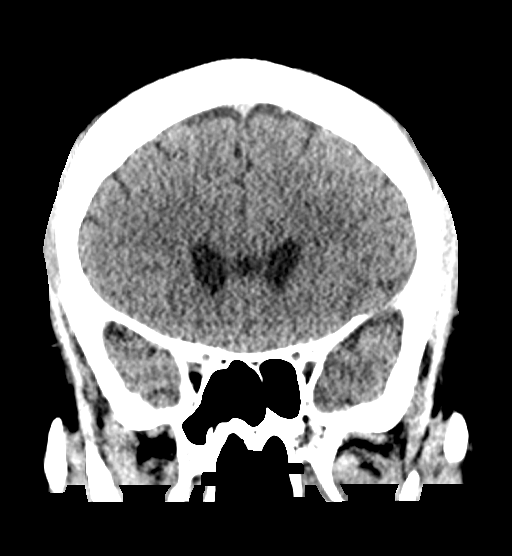
[im 37/82  brain]
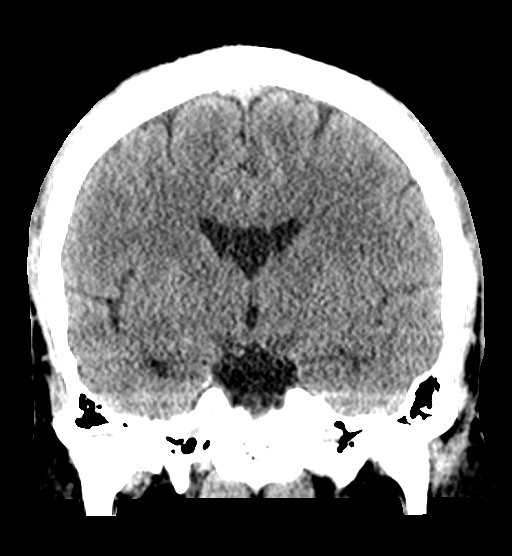
[im 46/82  brain]
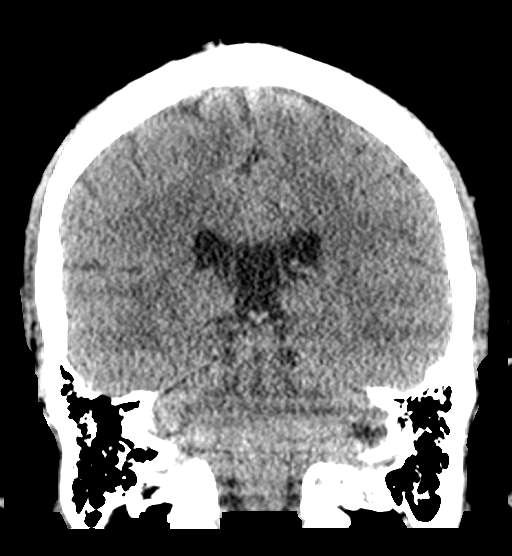

[Series 5: sagittal soft · sagittal · 0.37mm/px · 3 of 61 slices shown]
[im 21/61  brain]
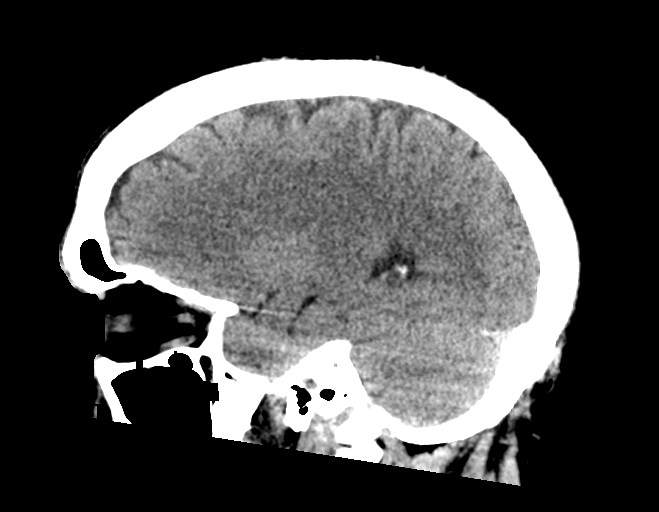
[im 31/61  brain]
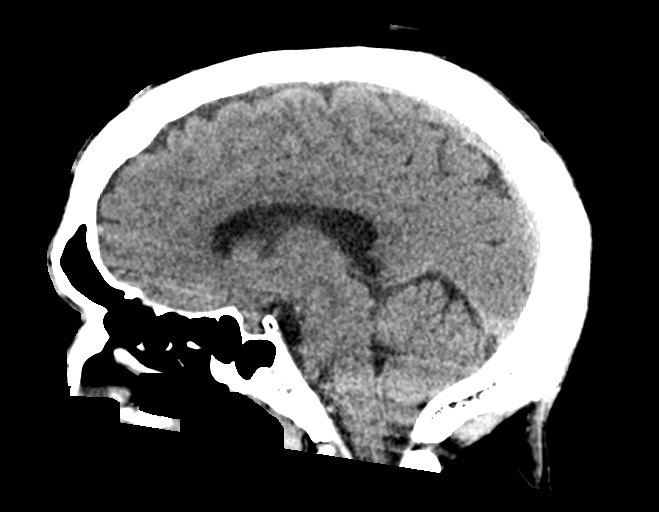
[im 41/61  brain]
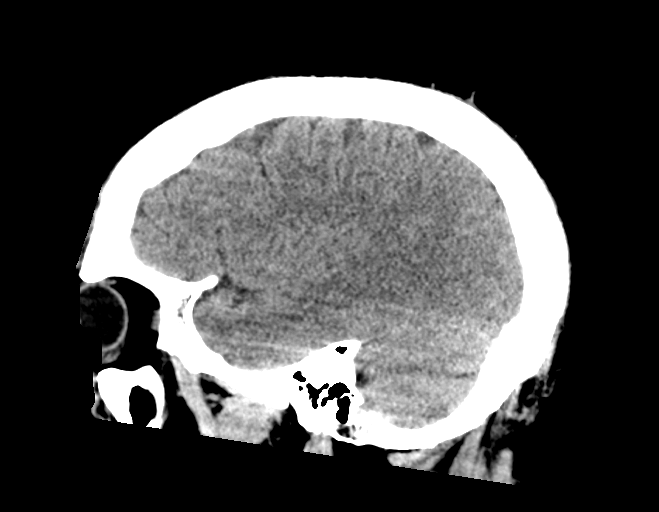

[16 of 47 positions shown; findings below may reference images not displayed]

FINDINGS: Brain: No evidence of acute infarction, hemorrhage, hydrocephalus,
extra-axial collection or mass lesion/mass effect.

Vascular: No hyperdense vessel.

Skull: Normal. Negative for fracture or focal lesion.

Sinuses/Orbits: No acute finding.

Other: None
IMPRESSION: No focal acute intracranial abnormality identified.

## 2021-08-23 ENCOUNTER — Encounter: Payer: Self-pay | Admitting: Nurse Practitioner

## 2021-08-23 ENCOUNTER — Ambulatory Visit (INDEPENDENT_AMBULATORY_CARE_PROVIDER_SITE_OTHER): Payer: 59 | Admitting: Nurse Practitioner

## 2021-08-23 ENCOUNTER — Other Ambulatory Visit: Payer: Self-pay

## 2021-08-23 VITALS — BP 131/82 | HR 77 | Ht 74.0 in | Wt 201.0 lb

## 2021-08-23 DIAGNOSIS — F419 Anxiety disorder, unspecified: Secondary | ICD-10-CM | POA: Diagnosis not present

## 2021-08-23 DIAGNOSIS — I480 Paroxysmal atrial fibrillation: Secondary | ICD-10-CM

## 2021-08-23 DIAGNOSIS — E782 Mixed hyperlipidemia: Secondary | ICD-10-CM | POA: Diagnosis not present

## 2021-08-23 DIAGNOSIS — I10 Essential (primary) hypertension: Secondary | ICD-10-CM | POA: Diagnosis not present

## 2021-08-23 DIAGNOSIS — F32A Depression, unspecified: Secondary | ICD-10-CM

## 2021-08-23 DIAGNOSIS — N138 Other obstructive and reflux uropathy: Secondary | ICD-10-CM

## 2021-08-23 DIAGNOSIS — N401 Enlarged prostate with lower urinary tract symptoms: Secondary | ICD-10-CM

## 2021-08-23 DIAGNOSIS — E785 Hyperlipidemia, unspecified: Secondary | ICD-10-CM | POA: Insufficient documentation

## 2021-08-23 NOTE — Patient Instructions (Addendum)
Please get your fasting labs done as discussed    It is important that you exercise regularly at least 30 minutes 5 times a week.  Think about what you will eat, plan ahead. Choose " clean, green, fresh or frozen" over canned, processed or packaged foods which are more sugary, salty and fatty. 70 to 75% of food eaten should be vegetables and fruit. Three meals at set times with snacks allowed between meals, but they must be fruit or vegetables. Aim to eat over a 12 hour period , example 7 am to 7 pm, and STOP after  your last meal of the day. Drink water,generally about 64 ounces per day, no other drink is as healthy. Fruit juice is best enjoyed in a healthy way, by EATING the fruit.  Thanks for choosing [facility], we consider it a privelige to serve you.

## 2021-08-23 NOTE — Assessment & Plan Note (Signed)
followed by cardiology, last visit to cardiology 2 months ago Takes Asprin 81 mg daily Cardizem capsule daily Pt encouraged to keep all  follow up appointment with cardiology, he verbalized understanding.

## 2021-08-23 NOTE — Progress Notes (Signed)
° °  William Kim     MRN: [id number]      DOB: Jan 27, 1966   HPI William Kim with medical history of hypertension, A-fib, BPH, anxiety and depression, hyperlipidemia is here for follow up for HTN,and HLD.   Denies any new complaints, denies any adverse recations to current meds   ROS Denies recent fever or chills. Denies sinus pressure, nasal congestion, ear pain or sore throat. Denies chest congestion, productive cough or wheezing. Denies chest pains, palpitations and leg swelling Denies abdominal pain, nausea, vomiting,diarrhea or constipation.   Denies dysuria, frequency, hesitancy or incontinence. Denies joint pain, swelling and limitation in mobility. Denies headaches, seizures, numbness, or tingling. Denies depression, anxiety or insomnia.    PE  BP 131/82 (BP Location: Right Arm, Patient Position: Sitting, Cuff Size: Large)    Pulse 77    Ht 6\' 2"  (1.88 m)    Wt 201 lb (91.2 kg)    SpO2 98%    BMI 25.81 kg/m   Patient alert and oriented and in no cardiopulmonary distress.  Chest: Clear to auscultation bilaterally.  CVS: S1, S2 no murmurs, no S3.Regular rate.  ABD: Soft non tender.   Ext: No edema  MS: Adequate ROM spine, shoulders, hips and knees.  Psych: Good eye contact, normal affect. Memory intact not anxious or depressed appearing.  CNS: CN 2-12 intact, power,  normal throughout.no focal deficits noted.   Assessment & Plan

## 2021-08-23 NOTE — Assessment & Plan Note (Addendum)
BP Readings from Last 3 Encounters:  08/23/21 131/82  06/28/21 121/73  06/12/21 (!) 128/92  well controlled, Takes cardizem 134m once daily for afib.  DASH diet advised, regular exercise 30 minutes 5 times a week advised, pt verbalized understanding.  CMP plus EGFR today

## 2021-08-23 NOTE — Assessment & Plan Note (Addendum)
Simvastatin 10mg  daily. Lab Results  Component Value Date   CHOL 142 02/15/2021   HDL 39 (L) 02/15/2021   LDLCALC 92 02/15/2021   TRIG 50 02/15/2021   CHOLHDL 3.5 12/29/2019  Continue simvastatin 10 mg daily, lipid panel today.

## 2021-08-24 ENCOUNTER — Encounter: Payer: Self-pay | Admitting: Nurse Practitioner

## 2021-08-24 NOTE — Assessment & Plan Note (Addendum)
Condition well-controlled Takes alfuzosin 10 mg daily Patient denies complaints today followed by urology, last visit to urology 2 months ago, f/u with urology in 1 year

## 2021-08-27 ENCOUNTER — Encounter: Payer: Self-pay | Admitting: Internal Medicine

## 2021-08-27 ENCOUNTER — Telehealth: Payer: Self-pay

## 2021-08-27 ENCOUNTER — Other Ambulatory Visit: Payer: Self-pay | Admitting: Nurse Practitioner

## 2021-08-27 DIAGNOSIS — R748 Abnormal levels of other serum enzymes: Secondary | ICD-10-CM

## 2021-08-27 LAB — CMP14+EGFR
ALT: 52 IU/L — ABNORMAL HIGH (ref 0–44)
AST: 43 IU/L — ABNORMAL HIGH (ref 0–40)
Albumin/Globulin Ratio: 2.4 — ABNORMAL HIGH (ref 1.2–2.2)
Albumin: 4.6 g/dL (ref 3.8–4.9)
Alkaline Phosphatase: 79 IU/L (ref 44–121)
BUN/Creatinine Ratio: 10 (ref 9–20)
BUN: 9 mg/dL (ref 6–24)
Bilirubin Total: 0.5 mg/dL (ref 0.0–1.2)
CO2: 24 mmol/L (ref 20–29)
Calcium: 9.5 mg/dL (ref 8.7–10.2)
Chloride: 105 mmol/L (ref 96–106)
Creatinine, Ser: 0.93 mg/dL (ref 0.76–1.27)
Globulin, Total: 1.9 g/dL (ref 1.5–4.5)
Glucose: 86 mg/dL (ref 70–99)
Potassium: 3.5 mmol/L (ref 3.5–5.2)
Sodium: 143 mmol/L (ref 134–144)
Total Protein: 6.5 g/dL (ref 6.0–8.5)
eGFR: 98 mL/min/{1.73_m2} (ref >59)

## 2021-08-27 LAB — LIPID PANEL
Chol/HDL Ratio: 3.6 ratio (ref 0.0–5.0)
Cholesterol, Total: 146 mg/dL (ref 100–199)
HDL: 41 mg/dL (ref >39)
LDL Chol Calc (NIH): 91 mg/dL (ref 0–99)
Triglycerides: 72 mg/dL (ref 0–149)
VLDL Cholesterol Cal: 14 mg/dL (ref 5–40)

## 2021-08-27 NOTE — Telephone Encounter (Signed)
Patient returning call lab results

## 2021-08-27 NOTE — Telephone Encounter (Signed)
Spoke with pt advised of results and referral pt verbalized understanding

## 2021-09-12 ENCOUNTER — Other Ambulatory Visit: Payer: Self-pay | Admitting: Internal Medicine

## 2021-09-18 ENCOUNTER — Other Ambulatory Visit: Payer: Self-pay | Admitting: Internal Medicine

## 2021-09-18 DIAGNOSIS — N529 Male erectile dysfunction, unspecified: Secondary | ICD-10-CM

## 2021-09-29 ENCOUNTER — Encounter: Payer: Self-pay | Admitting: Nurse Practitioner

## 2021-10-14 ENCOUNTER — Other Ambulatory Visit: Payer: Self-pay

## 2021-10-14 ENCOUNTER — Telehealth: Payer: Self-pay

## 2021-10-14 MED ORDER — VALACYCLOVIR HCL 500 MG PO TABS: 500.0000 mg | ORAL_TABLET | Freq: Every day | ORAL | 0 refills | Status: DC

## 2021-10-14 NOTE — Telephone Encounter (Signed)
Patient went to ER received faxed need refill ?valACYclovir (VALTREX) 500 MG tablet  ? ? ?

## 2021-10-14 NOTE — Telephone Encounter (Signed)
Med refilled.

## 2021-11-09 ENCOUNTER — Other Ambulatory Visit: Payer: Self-pay | Admitting: Nurse Practitioner

## 2021-11-11 ENCOUNTER — Other Ambulatory Visit (HOSPITAL_COMMUNITY): Payer: Self-pay

## 2021-11-11 MED ORDER — VALACYCLOVIR HCL 500 MG PO TABS
500.0000 mg | ORAL_TABLET | Freq: Every day | ORAL | 0 refills | Status: DC
  Filled 2021-11-11: qty 30, 30d supply, fill #0

## 2021-11-12 ENCOUNTER — Other Ambulatory Visit: Payer: Self-pay

## 2021-11-12 MED ORDER — VALACYCLOVIR HCL 500 MG PO TABS: 500.0000 mg | ORAL_TABLET | Freq: Every day | ORAL | 1 refills | Status: DC

## 2021-11-13 ENCOUNTER — Other Ambulatory Visit (HOSPITAL_COMMUNITY): Payer: Self-pay

## 2021-11-20 ENCOUNTER — Other Ambulatory Visit: Payer: Self-pay | Admitting: Internal Medicine

## 2021-12-05 ENCOUNTER — Ambulatory Visit (INDEPENDENT_AMBULATORY_CARE_PROVIDER_SITE_OTHER): Payer: 59 | Admitting: Gastroenterology

## 2021-12-05 ENCOUNTER — Encounter: Payer: Self-pay | Admitting: *Deleted

## 2021-12-05 ENCOUNTER — Encounter: Payer: Self-pay | Admitting: Gastroenterology

## 2021-12-05 DIAGNOSIS — R748 Abnormal levels of other serum enzymes: Secondary | ICD-10-CM

## 2021-12-05 NOTE — Patient Instructions (Signed)
Please have blood work done today.  We are also arranging an ultrasound in the near future.  I would like to see what your liver numbers do when stopping the statin for a few months. Please review with your primary care provider today.   We will repeat the liver numbers in 3 months and see you back then!  I enjoyed seeing you again today! As you know, I value our relationship and want to provide genuine, compassionate, and quality care. I welcome your feedback. If you receive a survey regarding your visit,  I greatly appreciate you taking time to fill this out. See you next time!  Annitta Needs, PhD, ANP-BC [facility]

## 2021-12-05 NOTE — Progress Notes (Signed)
Gastroenterology Office Note    Referring Provider: Renee Rival, FNP Primary Care Physician:  Renee Rival, FNP  Primary GI: Dr. Gala Romney    Chief Complaint   Chief Complaint  Patient presents with   Elevated Hepatic Enzymes     History of Present Illness   William Kim is a 56 y.o. male presenting today at the request of Paseda, Folashade R, FNP due to elevated LFTs.   Mildly elevated transaminases noted for past few years. Hep C antibody negative in 2022. No [place]. No thrombocytopenia.   No ETOH currently. Used to drink daily but has not had ETOH for 5 years. Denies Tylenol. Takes MVI, tumeric, beet pills. On simvastatin several years. Changed up eating habit. Lipid panel in Feb 2023.    Wife passed July 7th a year ago in a car accident. He is trying to focus on his health.  No abdominal pain, N/V, changes in bowel habits, constipation, diarrhea, overt GI bleeding, GERD, dysphagia, unexplained weight loss, lack of appetite, unexplained weight gain.      Past Medical History:  Diagnosis Date   A-fib ([place])    Anemia    Phreesia 12/26/2019   Anxiety    Atrial fibrillation ([place])    Cervicalgia    Encounter for screening colonoscopy 07/02/2018   High cholesterol    Hypertension    Obesity (BMI 30-39.9) 05/31/2018   OSA (obstructive sleep apnea) 05/31/2018   Severe OSA with AHI 79/h with significant oxygen desaturations as low as 58%.   Sleep apnea     Past Surgical History:  Procedure Laterality Date   COLONOSCOPY WITH PROPOFOL N/A 08/02/2018   normal   NECK SURGERY     NERVE SURGERY     None      Current Outpatient Medications  Medication Sig Dispense Refill   alfuzosin (UROXATRAL) 10 MG 24 hr tablet TAKE 1 TABLET BY MOUTH EVERY DAY 30 tablet 11   Ascorbic Acid (VITAMIN C PO) Take 1 tablet by mouth daily.     aspirin EC 81 MG tablet Take 81 mg by mouth daily.     buPROPion (WELLBUTRIN XL) 300 MG 24 hr tablet TAKE ONE TABLET BY MOUTH EVERY  MORNING FOR MOOD    TAKE AT BREAKFAST     diclofenac (VOLTAREN) 75 MG EC tablet Take 75 mg by mouth 2 (two) times daily as needed.     diltiazem (CARDIZEM CD) 180 MG 24 hr capsule TAKE 1 CAPSULE EVERY MORNING 90 capsule 3   sildenafil (VIAGRA) 100 MG tablet TAKE 1 TABLET (100 MG TOTAL) BY MOUTH DAILY AS NEEDED. 30 tablet 2   simvastatin (ZOCOR) 10 MG tablet TAKE 1 TABLET BY MOUTH EVERYDAY AT BEDTIME 90 tablet 1   valACYclovir (VALTREX) 500 MG tablet Take 1 tablet by mouth daily. 90 tablet 1   No current facility-administered medications for this visit.    Allergies as of 12/05/2021   (No Known Allergies)    Family History  Problem Relation Age of Onset   Healthy Mother    Healthy Father    Colon cancer Neg Hx    Colon polyps Neg Hx     Social History   Socioeconomic History   Marital status: Widowed    Spouse name: Archie Patten    Number of children: 2   Years of education: Not on file   Highest education level: 12th grade  Occupational History   Occupation: welder  Tobacco Use   Smoking status: Former  Types: Cigarettes   Smokeless tobacco: Never   Tobacco comments:    quit smoking in 2012/2013, quit smoking August 16th  Vaping Use   Vaping Use: Never used  Substance and Sexual Activity   Alcohol use: Not Currently    Comment: stopped 3-4 months ago   Drug use: No   Sexual activity: Yes  Other Topics Concern   Not on file  Social History Narrative   Lives with wife Archie Patten married 3/10 51 years   2 children   Son 32 lives in [place], 1 grandbaby 62 months old 2020   Daughter 36 she is living at home      Enjoys fishing      Diet: eats veggies and salmon, avoid red meats   Caffeine: does not drink    Water: 2 bottles day       Wear seat belt    Smoke detectors   Does not use phone while driving    Social Determinants of Health   Financial Resource Strain: Not on file  Food Insecurity: Not on file  Transportation Needs: Not on file  Physical Activity:  Insufficiently Active (05/31/2019)   Exercise Vital Sign    Days of Exercise per Week: 5 days    Minutes of Exercise per Session: 20 min  Stress: Stress Concern Present (05/31/2019)   [place]    Feeling of Stress : To some extent  Social Connections: Moderately Isolated (05/31/2019)   Social Connection and Isolation Panel [NHANES]    Frequency of Communication with Friends and Family: Three times a week    Frequency of Social Gatherings with Friends and Family: Three times a week    Attends Religious Services: Never    Active Member of Clubs or Organizations: No    Attends Archivist Meetings: Never    Marital Status: Married  Human resources officer Violence: Not on file     Review of Systems   Gen: Denies any fever, chills, fatigue, weight loss, lack of appetite.  CV: Denies chest pain, heart palpitations, peripheral edema, syncope.  Resp: Denies shortness of breath at rest or with exertion. Denies wheezing or cough.  GI:see HPI  GU : Denies urinary burning, urinary frequency, urinary hesitancy MS: Denies joint pain, muscle weakness, cramps, or limitation of movement.  Derm: Denies rash, itching, dry skin Psych: Denies depression, anxiety, memory loss, and confusion Heme: Denies bruising, bleeding, and enlarged lymph nodes.   Physical Exam   BP 118/82 (BP Location: Left Arm, Patient Position: Sitting, Cuff Size: Normal)   Pulse 89   Temp 97.7 F (36.5 C) (Temporal)   Ht '6\' 2"'$  (1.88 m)   Wt 197 lb 9.6 oz (89.6 kg)   SpO2 98%   BMI 25.37 kg/m  General:   Alert and oriented. Pleasant and cooperative. Well-nourished and well-developed.  Head:  Normocephalic and atraumatic. Eyes:  Without icterus Ears:  Normal auditory acuity. Lungs:  Clear to auscultation bilaterally.  Heart:  S1, S2 present without murmurs appreciated.  Abdomen:  +BS, soft, non-tender and non-distended. No HSM noted. No guarding or  rebound. No masses appreciated.  Rectal:  Deferred  Msk:  Symmetrical without gross deformities. Normal posture. Extremities:  Without edema. Neurologic:  Alert and  oriented x4;  grossly normal neurologically. Skin:  Intact without significant lesions or rashes. Psych:  Alert and cooperative. Normal mood and affect.   Assessment   William Kim is a 55 y.o. male presenting  today with chronically mildly elevated transaminases, Hep C antibody negative, no recent US on file.  He does not drink alcohol currently and has not had a drink in 5 years; he did used to drink daily. I do note he has been on a statin chronically; his lipid panel is normal and has been normal on several occasions.  I suspect mildly elevated transaminases could be secondary to statin effect; unable to rule out fatty liver or other underlying etiology but less likely. He has no concerns today.  Will check Hep A, B serologies and add on iron studies. I am suggesting to hold off on statin for about 3 months if ok with PCP, then recheck HFP.   I have also asked him to avoid any OTC supplements except MVI.    PLAN   Hep A and B serologies, iron studies US abdomen complete Recommend trial off statin: patient to discuss with PCP and let me know Continue healthy diet/exercise HFP in 3 months Return in 3 months  Annitta Needs, PhD, [facility]

## 2021-12-06 LAB — HEPATITIS A ANTIBODY, TOTAL: hep A Total Ab: NEGATIVE

## 2021-12-06 LAB — HEPATITIS B SURFACE ANTIGEN: Hepatitis B Surface Ag: NEGATIVE

## 2021-12-06 LAB — IRON,TIBC AND FERRITIN PANEL
Ferritin: 146 ng/mL (ref 30–400)
Iron Saturation: 24 % (ref 15–55)
Iron: 71 ug/dL (ref 38–169)
Total Iron Binding Capacity: 299 ug/dL (ref 250–450)
UIBC: 228 ug/dL (ref 111–343)

## 2021-12-06 LAB — HEPATITIS B SURFACE ANTIBODY,QUALITATIVE: Hep B Surface Ab, Qual: NONREACTIVE

## 2021-12-16 ENCOUNTER — Ambulatory Visit (HOSPITAL_COMMUNITY)
Admission: RE | Admit: 2021-12-16 | Discharge: 2021-12-16 | Disposition: A | Discharge status: Home / Self Care | Payer: 59 | Source: Ambulatory Visit | Attending: Gastroenterology | Admitting: Gastroenterology

## 2021-12-16 DIAGNOSIS — R748 Abnormal levels of other serum enzymes: Secondary | ICD-10-CM | POA: Insufficient documentation

## 2022-02-21 ENCOUNTER — Ambulatory Visit: Payer: 59 | Admitting: Nurse Practitioner

## 2022-03-05 ENCOUNTER — Ambulatory Visit (INDEPENDENT_AMBULATORY_CARE_PROVIDER_SITE_OTHER): Payer: 59 | Admitting: Nurse Practitioner

## 2022-03-05 ENCOUNTER — Encounter: Payer: Self-pay | Admitting: Nurse Practitioner

## 2022-03-05 VITALS — BP 123/78 | HR 71 | Ht 74.0 in | Wt 198.0 lb

## 2022-03-05 DIAGNOSIS — H547 Unspecified visual loss: Secondary | ICD-10-CM | POA: Insufficient documentation

## 2022-03-05 DIAGNOSIS — E785 Hyperlipidemia, unspecified: Secondary | ICD-10-CM

## 2022-03-05 DIAGNOSIS — M5432 Sciatica, left side: Secondary | ICD-10-CM

## 2022-03-05 DIAGNOSIS — F32A Depression, unspecified: Secondary | ICD-10-CM

## 2022-03-05 DIAGNOSIS — I4891 Unspecified atrial fibrillation: Secondary | ICD-10-CM | POA: Diagnosis not present

## 2022-03-05 DIAGNOSIS — F419 Anxiety disorder, unspecified: Secondary | ICD-10-CM | POA: Diagnosis not present

## 2022-03-05 DIAGNOSIS — Z0000 Encounter for general adult medical examination without abnormal findings: Secondary | ICD-10-CM | POA: Insufficient documentation

## 2022-03-05 DIAGNOSIS — Z0001 Encounter for general adult medical examination with abnormal findings: Secondary | ICD-10-CM | POA: Diagnosis not present

## 2022-03-05 NOTE — Assessment & Plan Note (Signed)
Chronic condition well controlled  on Cardizem 180 mg daily Patient encouraged to maintain close follow-up with cardiologist he verbalized understanding.

## 2022-03-05 NOTE — Progress Notes (Signed)
Complete physical exam  Patient: William Kim   DOB: 1965/07/17   56 y.o. Male  MRN: [id number]  Subjective:    Chief Complaint  Patient presents with   Follow-up    F/u   Leg Pain    Left side done to foot 01/29/22    [name] is a 56 y.o. male with past medical history of atrial fibrillation, anxiety and depression, hyperlipidemia, who presents today for a complete physical exam. He reports consuming a low fat and low sodium diet. He generally feels well.  For exercises he does weightlifting, push-ups, exercise on elliptical machine and treadmill    Sciatica. Has been having sciatic nerve pain , numbness, tingling sensation shooting down his left leg since about a month ago. He has been taking diclofenac PRN,   Has been doing stretching exercise and applying OTC pain ointment PRN . Pain  is currently 5 /10 was worse when it started.  Had similar symptoms on his right leg in the past but not currently. Denies back pain   Hyperlipidemia. GI told him top stop simvastatin for now, he has been on a low fat diet since then.   Has dry skin on both hands ,denies itching , rashes, redness.  Refused flu vaccine today ,Will get flu vaccine at work.      Most recent fall risk assessment:    03/05/2022    3:55 PM  [place] in the past year? 0  Number falls in past yr: 0  Injury with Fall? 0  Risk for fall due to : No Fall Risks  Follow up Falls evaluation completed     Most recent depression screenings:    03/05/2022    3:55 PM 08/23/2021    3:55 PM  PHQ 2/9 Scores  PHQ - 2 Score 0 0        Patient Care Team: Renee Rival, FNP as PCP - General (Nurse Practitioner) Sueanne Margarita, MD as PCP - Sleep Medicine (Sleep Medicine) Daneil Dolin, MD as Consulting Physician (Gastroenterology)   Outpatient Medications Prior to Visit  Medication Sig Note   alfuzosin (UROXATRAL) 10 MG 24 hr tablet TAKE 1 TABLET BY MOUTH EVERY DAY    Ascorbic Acid  (VITAMIN C PO) Take 1 tablet by mouth daily.    aspirin EC 81 MG tablet Take 81 mg by mouth daily.    buPROPion (WELLBUTRIN XL) 300 MG 24 hr tablet TAKE ONE TABLET BY MOUTH EVERY MORNING FOR MOOD    TAKE AT BREAKFAST 03/05/2022: As needed   diclofenac (VOLTAREN) 75 MG EC tablet Take 75 mg by mouth 2 (two) times daily as needed.    diltiazem (CARDIZEM CD) 180 MG 24 hr capsule TAKE 1 CAPSULE EVERY MORNING    sildenafil (VIAGRA) 100 MG tablet TAKE 1 TABLET (100 MG TOTAL) BY MOUTH DAILY AS NEEDED.    valACYclovir (VALTREX) 500 MG tablet Take 1 tablet by mouth daily. 03/05/2022: As needed   simvastatin (ZOCOR) 10 MG tablet TAKE 1 TABLET BY MOUTH EVERYDAY AT BEDTIME (Patient not taking: Reported on 03/05/2022)    No facility-administered medications prior to visit.    Review of Systems  Constitutional: Negative.   HENT: Negative.    Eyes: Negative.   Respiratory: Negative.    Cardiovascular: Negative.   Gastrointestinal: Negative.  Negative for heartburn, nausea and vomiting.  Musculoskeletal:  Positive for joint pain. Negative for back pain, falls, myalgias and neck pain.  Skin: Negative.  Neurological:  Positive for tingling. Negative for dizziness, focal weakness, seizures, loss of consciousness, weakness and headaches.  Endo/Heme/Allergies: Negative.  Negative for environmental allergies. Does not bruise/bleed easily.  Psychiatric/Behavioral:  Negative for depression, hallucinations, substance abuse and suicidal ideas. The patient is nervous/anxious. The patient does not have insomnia.           Objective:     BP 123/78 (BP Location: Left Arm, Patient Position: Sitting, Cuff Size: Normal)   Pulse 71   Ht '6\' 2"'  (1.88 m)   Wt 198 lb (89.8 kg)   SpO2 94%   BMI 25.42 kg/m    Physical Exam Vitals and nursing note reviewed.  Constitutional:      General: He is not in acute distress.    Appearance: He is not ill-appearing, toxic-appearing or diaphoretic.  HENT:     Head:  Normocephalic and atraumatic.     Right Ear: Tympanic membrane, ear canal and external ear normal. There is no impacted cerumen.     Left Ear: Tympanic membrane, ear canal and external ear normal. There is no impacted cerumen.     Nose: No congestion or rhinorrhea.     Mouth/Throat:     Mouth: Mucous membranes are moist.     Pharynx: Oropharynx is clear. No oropharyngeal exudate or posterior oropharyngeal erythema.  Eyes:     General: No scleral icterus.       Right eye: No discharge.        Left eye: No discharge.     Extraocular Movements: Extraocular movements intact.     Conjunctiva/sclera: Conjunctivae normal.     Pupils: Pupils are equal, round, and reactive to light.  Neck:     Vascular: No carotid bruit.  Cardiovascular:     Rate and Rhythm: Normal rate and regular rhythm.     Pulses: Normal pulses.     Heart sounds: Normal heart sounds. No murmur heard.    No friction rub. No gallop.  Pulmonary:     Effort: Pulmonary effort is normal. No respiratory distress.     Breath sounds: Normal breath sounds. No stridor. No wheezing, rhonchi or rales.  Chest:     Chest wall: No tenderness.  Abdominal:     General: There is no distension.     Palpations: There is no mass.     Tenderness: There is no abdominal tenderness. There is no right CVA tenderness, left CVA tenderness, guarding or rebound.     Hernia: No hernia is present.  Musculoskeletal:        General: No swelling, tenderness, deformity or signs of injury. Normal range of motion.     Cervical back: Normal range of motion and neck supple. No rigidity or tenderness.     Right lower leg: No edema.     Left lower leg: No edema.  Lymphadenopathy:     Cervical: No cervical adenopathy.  Skin:    General: Skin is warm and dry.     Capillary Refill: Capillary refill takes less than 2 seconds.     Coloration: Skin is not jaundiced or pale.     Findings: No bruising, erythema, lesion or rash.  Neurological:     Mental Status:  He is alert and oriented to person, place, and time. Mental status is at baseline.     Cranial Nerves: No cranial nerve deficit.     Motor: No weakness.     Coordination: Coordination normal.     Gait: Gait normal.  Deep Tendon Reflexes: Reflexes normal.  Psychiatric:        Mood and Affect: Mood normal.        Behavior: Behavior normal.        Thought Content: Thought content normal.        Judgment: Judgment normal.      No results found for any visits on 03/05/22.     Assessment & Plan:    Routine Health Maintenance and Physical Exam  Immunization History  Administered Date(s) Administered   Influenza,inj,Quad PF,6+ Mos 04/03/2019   Influenza-Unspecified 03/31/2019, 02/29/2020   PFIZER(Purple Top)SARS-COV-2 Vaccination 09/06/2019, 09/27/2019, 05/06/2020, 05/06/2020, 11/04/2020   Td 12/13/2021   Zoster Recombinat (Shingrix) 12/12/2019, 03/06/2020    Health Maintenance  Topic Date Due   COVID-19 Vaccine (6 - Pfizer series) 12/30/2020   INFLUENZA VACCINE  09/28/2022 (Originally 01/28/2022)   COLONOSCOPY (Pts 45-50yr Insurance coverage will need to be confirmed)  08/02/2028   TETANUS/TDAP  12/14/2031   Hepatitis C Screening  Completed   HIV Screening  Completed   Zoster Vaccines- Shingrix  Completed   HPV VACCINES  Aged Out    Discussed health benefits of physical activity, and encouraged him to engage in regular exercise appropriate for his age and condition.  Problem List Items Addressed This Visit       Cardiovascular and Mediastinum   Atrial fibrillation (HCC)    Chronic condition well controlled  on Cardizem 180 mg daily Patient encouraged to maintain close follow-up with cardiologist he verbalized understanding.        Nervous and Auditory   Sciatica, left side    Continue Diclofenac 75 mg BID PRN, OTC pain ointment , stretching exercises encouraged, application of heat encouraged.  Will refer to orthopedics if symptoms does not improve        Other    Anxiety and depression    PHQ 9 score 0 GAD-7: 7 He reports feeling much better,Currently on Wellbutrin 300 mg daily  but reports not taking medication consistently  Denies SI, HI       Hyperlipidemia    Currently not taking simvastatin due to elevated liver enzymes Check lipid panel Avoid fatty fried foods      Annual physical exam - Primary    Annual exam as documented.  Counseling done include healthy lifestyle involving committing to 150 minutes of exercise per week, heart healthy diet, and attaining healthy weight. The importance of adequate sleep also discussed.  Regular use of seat belt and home safety were also discussed . Immunization and cancer screening  needs are specifically addressed at this visit.  Up-to-date with colonoscopy, Tdap vaccine, vaccine.  patient encouraged to get flu vaccine      Relevant Orders   Lipid Profile   CMP14+EGFR   TSH   PSA   CBC with Differential   HgB A1c   Vitamin D (25 hydroxy)   Impaired vision    Able to read better  with reading glasses.       Return in about 6 months (around 09/03/2022) for HLD/HTN.     FRenee Rival FNP

## 2022-03-05 NOTE — Assessment & Plan Note (Signed)
Continue Diclofenac 75 mg BID PRN, OTC pain ointment , stretching exercises encouraged, application of heat encouraged.  Will refer to orthopedics if symptoms does not improve

## 2022-03-05 NOTE — Assessment & Plan Note (Signed)
Annual exam as documented.  Counseling done include healthy lifestyle involving committing to 150 minutes of exercise per week, heart healthy diet, and attaining healthy weight. The importance of adequate sleep also discussed.  Regular use of seat belt and home safety were also discussed . Immunization and cancer screening  needs are specifically addressed at this visit.  Up-to-date with colonoscopy, Tdap vaccine, vaccine.  patient encouraged to get flu vaccine

## 2022-03-05 NOTE — Assessment & Plan Note (Addendum)
PHQ 9 score 0 GAD-7: 7 He reports feeling much better,Currently on Wellbutrin 300 mg daily  but reports not taking medication consistently  Denies SI, HI

## 2022-03-05 NOTE — Patient Instructions (Addendum)
Please get Cerave  Cetaphil Mositurizing lotion at your pharmacy apply daily to your skin for dryness    It is important that you exercise regularly at least 30 minutes 5 times a week.  Think about what you will eat, plan ahead. Choose " clean, green, fresh or frozen" over canned, processed or packaged foods which are more sugary, salty and fatty. 70 to 75% of food eaten should be vegetables and fruit. Three meals at set times with snacks allowed between meals, but they must be fruit or vegetables. Aim to eat over a 12 hour period , example 7 am to 7 pm, and STOP after  your last meal of the day. Drink water,generally about 64 ounces per day, no other drink is as healthy. Fruit juice is best enjoyed in a healthy way, by EATING the fruit.  Thanks for choosing [facility], we consider it a privelige to serve you.

## 2022-03-05 NOTE — Assessment & Plan Note (Addendum)
Able to read better  with reading glasses.

## 2022-03-05 NOTE — Assessment & Plan Note (Signed)
Currently not taking simvastatin due to elevated liver enzymes Check lipid panel Avoid fatty fried foods

## 2022-03-11 LAB — CBC WITH DIFFERENTIAL/PLATELET
Basophils Absolute: 0 10*3/uL (ref 0.0–0.2)
Basos: 0 %
EOS (ABSOLUTE): 0.2 10*3/uL (ref 0.0–0.4)
Eos: 3 %
Hematocrit: 37.7 % (ref 37.5–51.0)
Hemoglobin: 12.5 g/dL — ABNORMAL LOW (ref 13.0–17.7)
Immature Grans (Abs): 0 10*3/uL (ref 0.0–0.1)
Immature Granulocytes: 0 %
Lymphocytes Absolute: 1.7 10*3/uL (ref 0.7–3.1)
Lymphs: 36 %
MCH: 30.2 pg (ref 26.6–33.0)
MCHC: 33.2 g/dL (ref 31.5–35.7)
MCV: 91 fL (ref 79–97)
Monocytes Absolute: 0.4 10*3/uL (ref 0.1–0.9)
Monocytes: 9 %
Neutrophils Absolute: 2.4 10*3/uL (ref 1.4–7.0)
Neutrophils: 52 %
Platelets: 176 10*3/uL (ref 150–450)
RBC: 4.14 x10E6/uL (ref 4.14–5.80)
RDW: 14.2 % (ref 11.6–15.4)
WBC: 4.8 10*3/uL (ref 3.4–10.8)

## 2022-03-11 LAB — LIPID PANEL
Chol/HDL Ratio: 5.5 ratio — ABNORMAL HIGH (ref 0.0–5.0)
Cholesterol, Total: 177 mg/dL (ref 100–199)
HDL: 32 mg/dL — ABNORMAL LOW
LDL Chol Calc (NIH): 130 mg/dL — ABNORMAL HIGH (ref 0–99)
Triglycerides: 82 mg/dL (ref 0–149)
VLDL Cholesterol Cal: 15 mg/dL (ref 5–40)

## 2022-03-11 LAB — CMP14+EGFR
ALT: 60 IU/L — ABNORMAL HIGH (ref 0–44)
AST: 51 IU/L — ABNORMAL HIGH (ref 0–40)
Albumin/Globulin Ratio: 2 (ref 1.2–2.2)
Albumin: 4.3 g/dL (ref 3.8–4.9)
Alkaline Phosphatase: 89 IU/L (ref 44–121)
BUN/Creatinine Ratio: 17 (ref 9–20)
BUN: 16 mg/dL (ref 6–24)
Bilirubin Total: 0.5 mg/dL (ref 0.0–1.2)
CO2: 21 mmol/L (ref 20–29)
Calcium: 9.4 mg/dL (ref 8.7–10.2)
Chloride: 104 mmol/L (ref 96–106)
Creatinine, Ser: 0.92 mg/dL (ref 0.76–1.27)
Globulin, Total: 2.2 g/dL (ref 1.5–4.5)
Glucose: 80 mg/dL (ref 70–99)
Potassium: 4 mmol/L (ref 3.5–5.2)
Sodium: 140 mmol/L (ref 134–144)
Total Protein: 6.5 g/dL (ref 6.0–8.5)
eGFR: 99 mL/min/{1.73_m2} (ref >59)

## 2022-03-11 LAB — VITAMIN D 25 HYDROXY (VIT D DEFICIENCY, FRACTURES): Vit D, 25-Hydroxy: 34 ng/mL (ref 30.0–100.0)

## 2022-03-11 LAB — HEMOGLOBIN A1C
Est. average glucose Bld gHb Est-mCnc: 111 mg/dL
Hgb A1c MFr Bld: 5.5 % (ref 4.8–5.6)

## 2022-03-11 LAB — TSH: TSH: 2.1 u[IU]/mL (ref 0.450–4.500)

## 2022-03-11 LAB — PSA: Prostate Specific Ag, Serum: 1 ng/mL (ref 0.0–4.0)

## 2022-03-11 NOTE — Progress Notes (Signed)
Eat a healthy diet, including lots of fruits and vegetables. Avoid foods with a lot of saturated and trans fats, such as red meat, butter, fried foods and cheese . Maintain a healthy weight.  Liver enzymes remains elevated, follow up with GI as planned, avoid tylenol , alcohol   Please schedule a 6 months follow up for hyperlipidemia.     The 10-year ASCVD risk score (Arnett DK, et al., 2019) is: 10.7%   Values used to calculate the score:     Age: 56 years     Sex: Male     Is Non-Hispanic African American: Yes     Diabetic: No     Tobacco smoker: No     Systolic Blood Pressure: 964 mmHg     Is BP treated: Yes     HDL Cholesterol: 32 mg/dL     Total Cholesterol: 177 mg/dL

## 2022-03-12 ENCOUNTER — Encounter: Payer: Self-pay | Admitting: Gastroenterology

## 2022-03-12 ENCOUNTER — Ambulatory Visit (INDEPENDENT_AMBULATORY_CARE_PROVIDER_SITE_OTHER): Payer: 59 | Admitting: Gastroenterology

## 2022-03-12 VITALS — BP 122/78 | HR 80 | Temp 98.0°F | Ht 74.0 in | Wt 197.4 lb

## 2022-03-12 DIAGNOSIS — R748 Abnormal levels of other serum enzymes: Secondary | ICD-10-CM | POA: Diagnosis not present

## 2022-03-12 NOTE — Patient Instructions (Addendum)
Let's stop the energy drink and protein powder.  Resume your simvastatin 10 mg daily.  We will recheck blood work in 3 months, and I will see you in 3 months!  Continue to exercise, and I think joining that gym is a great idea!  I enjoyed seeing you again today! As you know, I value our relationship and want to provide genuine, compassionate, and quality care. I welcome your feedback. If you receive a survey regarding your visit,  I greatly appreciate you taking time to fill this out. See you next time!  Annitta Needs, PhD, ANP-BC [facility]enter[facility]

## 2022-03-12 NOTE — Progress Notes (Signed)
Gastroenterology Office Note     Primary Care Physician:  Renee Rival, FNP  Primary Gastroenterologist: Dr. Gala Romney    Chief Complaint   Chief Complaint  Patient presents with   Follow-up    Patient here today for a follow up on his elevated liver enzymes. Patient denies any current gi issues.     History of Present Illness   William Kim is a 56 y.o. male presenting today in follow-up with a history of mildly elevated transaminases over the past few years. Hep C antibody negative in 2022. History of ETOH use in the past.   Unable to exclude statin effect; however, this was held. He has had persistently elevated transaminases mildly despite this. LDL now 130, previously 91. HDL low at 32. Hep B serologies negative. Hep A negative. Needs Hep A/B vaccinations. No evidence for iron overload. Liver normal. CBD 7 mm.    Diclofenac and ibuprofen for back pain as needed.  Protein shakes daily. He states this has a lot of sugar. Drinks Venom energy drink once to twice on weekends. No ETOH use. He has not been exercising as he would like.    Past Medical History:  Diagnosis Date   A-fib ([place])    Anemia    Phreesia 12/26/2019   Anxiety    Atrial fibrillation ([place])    Cervicalgia    Encounter for screening colonoscopy 07/02/2018   High cholesterol    Hypertension    Obesity (BMI 30-39.9) 05/31/2018   OSA (obstructive sleep apnea) 05/31/2018   Severe OSA with AHI 79/h with significant oxygen desaturations as low as 58%.   Sleep apnea     Past Surgical History:  Procedure Laterality Date   COLONOSCOPY WITH PROPOFOL N/A 08/02/2018   normal   NECK SURGERY     NERVE SURGERY     None      Current Outpatient Medications  Medication Sig Dispense Refill   alfuzosin (UROXATRAL) 10 MG 24 hr tablet TAKE 1 TABLET BY MOUTH EVERY DAY 30 tablet 11   Ascorbic Acid (VITAMIN C PO) Take 1 tablet by mouth daily.     aspirin EC 81 MG tablet Take 81 mg by mouth daily.      diclofenac (VOLTAREN) 75 MG EC tablet Take 75 mg by mouth 2 (two) times daily as needed.     diltiazem (CARDIZEM CD) 180 MG 24 hr capsule TAKE 1 CAPSULE EVERY MORNING 90 capsule 3   sildenafil (VIAGRA) 100 MG tablet TAKE 1 TABLET (100 MG TOTAL) BY MOUTH DAILY AS NEEDED. 30 tablet 2   valACYclovir (VALTREX) 500 MG tablet Take 1 tablet by mouth daily. 90 tablet 1   No current facility-administered medications for this visit.    Allergies as of 03/12/2022   (No Known Allergies)    Family History  Problem Relation Age of Onset   Hypertension Mother    Healthy Father    Colon cancer Neg Hx    Colon polyps Neg Hx    Liver disease Neg Hx     Social History   Socioeconomic History   Marital status: Widowed    Spouse name: Archie Patten    Number of children: 2   Years of education: Not on file   Highest education level: 12th grade  Occupational History   Occupation: welder  Tobacco Use   Smoking status: Former    Types: Cigarettes   Smokeless tobacco: Never   Tobacco comments:    quit smoking in 2012/2013,  quit smoking August 16th  Vaping Use   Vaping Use: Never used  Substance and Sexual Activity   Alcohol use: Not Currently    Comment: stopped 5 years ago   Drug use: No   Sexual activity: Yes  Other Topics Concern   Not on file  Social History Narrative      Wear seat belt    Smoke detectors   Does not use phone while driving    Social Determinants of Health   Financial Resource Strain: Low Risk  (05/31/2019)   Overall Financial Resource Strain (CARDIA)    Difficulty of Paying Living Expenses: Not hard at all  Food Insecurity: No Food Insecurity (05/31/2019)   Hunger Vital Sign    Worried About Running Out of Food in the Last Year: Never true    Ran Out of Food in the Last Year: Never true  Transportation Needs: No Transportation Needs (05/31/2019)   PRAPARE - Hydrologist (Medical): No    Lack of Transportation (Non-Medical): No  Physical  Activity: Insufficiently Active (05/31/2019)   Exercise Vital Sign    Days of Exercise per Week: 5 days    Minutes of Exercise per Session: 20 min  Stress: Stress Concern Present (05/31/2019)   [place]    Feeling of Stress : To some extent  Social Connections: Moderately Isolated (05/31/2019)   Social Connection and Isolation Panel [NHANES]    Frequency of Communication with Friends and Family: Three times a week    Frequency of Social Gatherings with Friends and Family: Three times a week    Attends Religious Services: Never    Active Member of Clubs or Organizations: No    Attends Archivist Meetings: Never    Marital Status: Married  Human resources officer Violence: Not At Risk (05/31/2019)   Humiliation, Afraid, Rape, and Kick questionnaire    Fear of Current or Ex-Partner: No    Emotionally Abused: No    Physically Abused: No    Sexually Abused: No     Review of Systems   Gen: Denies any fever, chills, fatigue, weight loss, lack of appetite.  CV: Denies chest pain, heart palpitations, peripheral edema, syncope.  Resp: Denies shortness of breath at rest or with exertion. Denies wheezing or cough.  GI: see HPI GU : Denies urinary burning, urinary frequency, urinary hesitancy MS: Denies joint pain, muscle weakness, cramps, or limitation of movement.  Derm: Denies rash, itching, dry skin Psych: Denies depression, anxiety, memory loss, and confusion Heme: Denies bruising, bleeding, and enlarged lymph nodes.   Physical Exam   BP 122/78 (BP Location: Right Arm, Patient Position: Sitting, Cuff Size: Large)   Pulse 80   Temp 98 F (36.7 C) (Oral)   Ht '6\' 2"'$  (1.88 m)   Wt 197 lb 6.4 oz (89.5 kg)   BMI 25.34 kg/m  General:   Alert and oriented. Pleasant and cooperative. Well-nourished and well-developed.  Head:  Normocephalic and atraumatic. Eyes:  Without icterus Abdomen:  +BS, soft, non-tender and  non-distended. No HSM noted. No guarding or rebound. No masses appreciated.  Rectal:  Deferred  Msk:  Symmetrical without gross deformities. Normal posture. Extremities:  Without edema. Neurologic:  Alert and  oriented x4;  grossly normal neurologically. Skin:  Intact without significant lesions or rashes. Psych:  Alert and cooperative. Normal mood and affect.  Lab Results  Component Value Date   ALT 60 (H) 03/10/2022  AST 51 (H) 03/10/2022   ALKPHOS 89 03/10/2022   BILITOT 0.5 03/10/2022   Lab Results  Component Value Date   CHOL 177 03/10/2022   HDL 32 (L) 03/10/2022   LDLCALC 130 (H) 03/10/2022   TRIG 82 03/10/2022   CHOLHDL 5.5 (H) 03/10/2022   Lab Results  Component Value Date   WBC 4.8 03/10/2022   HGB 12.5 (L) 03/10/2022   HCT 37.7 03/10/2022   MCV 91 03/10/2022   PLT 176 03/10/2022   Lab Results  Component Value Date   IRON 71 12/05/2021   TIBC 299 12/05/2021   FERRITIN 146 12/05/2021      Assessment   Dhruva HASSEL UPHOFF is a 56 y.o. male presenting today in follow-up with a history of mildly elevated transaminases over the past few years. Here for routine follow-up.  Abnormal transaminases: Hep C antibody negative in 2022. No current ETOH use. Hep B, A serologies negative. No iron overload. We held statin but saw no improvement in transaminases, and LDL actually worsened. We discussed resuming low dose statin, as this provided good control in the past. [place] with mildly dilated CBD at 7 mm, normal liver. No evidence for cholestasis on labs. We are monitoring LFTs clinically and not pursuing MRI unless bumps in LFTs. He does endorse protein shakes and energy drinks with increased sugar. At this point, recommend further serologies. However, he would like to continue behavior/diet modification first and then recheck HFP in 3 months. This is certainly appropriate, and we will also have him resume his statin daily, as we discussed need for management of  dyslipidemia.  PLAN    Diet/behavior modifications Resume simvastatin 10 mg daily Repeat HFP and lipid panel in 3 months. If remains elevated, pursue extensive serologies Will need Hep A/B vaccinations at some point in the future   Annitta Needs, PhD, [facility] Gastroenterology

## 2022-03-18 ENCOUNTER — Emergency Department (HOSPITAL_COMMUNITY)
Admission: EM | Admit: 2022-03-18 | Discharge: 2022-03-18 | Disposition: A | Discharge status: Home / Self Care | Payer: 59 | Attending: Emergency Medicine | Admitting: Emergency Medicine

## 2022-03-18 ENCOUNTER — Other Ambulatory Visit: Payer: Self-pay

## 2022-03-18 ENCOUNTER — Encounter (HOSPITAL_COMMUNITY): Payer: Self-pay

## 2022-03-18 DIAGNOSIS — I10 Essential (primary) hypertension: Secondary | ICD-10-CM | POA: Diagnosis not present

## 2022-03-18 DIAGNOSIS — K625 Hemorrhage of anus and rectum: Secondary | ICD-10-CM | POA: Diagnosis present

## 2022-03-18 DIAGNOSIS — K922 Gastrointestinal hemorrhage, unspecified: Secondary | ICD-10-CM

## 2022-03-18 DIAGNOSIS — Z79899 Other long term (current) drug therapy: Secondary | ICD-10-CM | POA: Diagnosis not present

## 2022-03-18 DIAGNOSIS — Z7982 Long term (current) use of aspirin: Secondary | ICD-10-CM | POA: Insufficient documentation

## 2022-03-18 LAB — COMPREHENSIVE METABOLIC PANEL
ALT: 65 U/L — ABNORMAL HIGH (ref 0–44)
AST: 59 U/L — ABNORMAL HIGH (ref 15–41)
Albumin: 4.3 g/dL (ref 3.5–5.0)
Alkaline Phosphatase: 77 U/L (ref 38–126)
Anion gap: 8 (ref 5–15)
BUN: 17 mg/dL (ref 6–20)
CO2: 24 mmol/L (ref 22–32)
Calcium: 9.1 mg/dL (ref 8.9–10.3)
Chloride: 111 mmol/L (ref 98–111)
Creatinine, Ser: 1.01 mg/dL (ref 0.61–1.24)
GFR, Estimated: >60 mL/min (ref >60)
Glucose, Bld: 109 mg/dL — ABNORMAL HIGH (ref 70–99)
Potassium: 3.9 mmol/L (ref 3.5–5.1)
Sodium: 143 mmol/L (ref 135–145)
Total Bilirubin: 0.8 mg/dL (ref 0.3–1.2)
Total Protein: 7.3 g/dL (ref 6.5–8.1)

## 2022-03-18 LAB — CBC
HCT: 36.8 % — ABNORMAL LOW (ref 39.0–52.0)
Hemoglobin: 12.8 g/dL — ABNORMAL LOW (ref 13.0–17.0)
MCH: 31.3 pg (ref 26.0–34.0)
MCHC: 34.8 g/dL (ref 30.0–36.0)
MCV: 90 fL (ref 80.0–100.0)
Platelets: 186 10*3/uL (ref 150–400)
RBC: 4.09 MIL/uL — ABNORMAL LOW (ref 4.22–5.81)
RDW: 13.7 % (ref 11.5–15.5)
WBC: 4 10*3/uL (ref 4.0–10.5)
nRBC: 0 % (ref 0.0–0.2)

## 2022-03-18 LAB — PROTIME-INR
INR: 0.9 (ref 0.8–1.2)
Prothrombin Time: 12.5 seconds (ref 11.4–15.2)

## 2022-03-18 LAB — TYPE AND SCREEN
ABO/RH(D): A POS
Antibody Screen: NEGATIVE

## 2022-03-18 MED ORDER — SODIUM CHLORIDE 0.9 % IV SOLN: INTRAVENOUS | Status: DC

## 2022-03-18 MED ORDER — PANTOPRAZOLE SODIUM 40 MG IV SOLR
40.0000 mg | Freq: Once | INTRAVENOUS | Status: AC
  Administered 2022-03-18: 40 mg via INTRAVENOUS
  Filled 2022-03-18: qty 10

## 2022-03-18 MED ORDER — SODIUM CHLORIDE 0.9 % IV BOLUS
1000.0000 mL | Freq: Once | INTRAVENOUS | Status: AC
  Administered 2022-03-18: 1000 mL via INTRAVENOUS

## 2022-03-18 MED ORDER — PANTOPRAZOLE SODIUM 40 MG PO TBEC: 40.0000 mg | DELAYED_RELEASE_TABLET | Freq: Every day | ORAL | 1 refills | Status: DC

## 2022-03-18 NOTE — Discharge Instructions (Signed)
Call office of Dr. Gala Romney tomorrow - ED for worsening bleeding  Dr. Gala Romney who I spoke with on the phone said that he would be able to see you in the office, he wants you to call first thing in the morning to make an appointment  This bleeding will likely continue for a few days, you should return if it becomes severely heavy.  I would also like for you to take a stool softener in case you have internal hemorrhoids that are bleeding, this will help reduce the amount of bleeding

## 2022-03-18 NOTE — ED Triage Notes (Signed)
Pt presents to ED with complaints of dark red stools x 3 days.

## 2022-03-18 NOTE — ED Notes (Signed)
Patient verbalizes understanding of discharge instructions. Opportunity for questioning and answers were provided. Armband removed by staff, pt discharged from ED. Ambulated out to lobby  

## 2022-03-18 NOTE — ED Provider Notes (Signed)
[facility] EMERGENCY DEPARTMENT Provider Note   CSN: [id number] Arrival date & time: 03/18/22  1731     History  Chief Complaint  Patient presents with   Rectal Bleeding    William Kim is a 56 y.o. male.   Rectal Bleeding  Patient is a 56 year old male, he has a history of hypertension on diltiazem, he does take a baby aspirin daily.  He presents to the hospital today with a complaint of rectal bleeding which has been going on for the last 2 days.  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  He does have some intermittent abdominal cramping with it, no difficulty with urination, no nausea or vomiting, no history of stomach ulcers.  No significant use of alcohol or history of diverticulosis to the best knowledge of the patient.  His last colonoscopy was a couple years ago, in February 2020.  This showed that the patient had no signs of diverticulosis on the colonoscopy.    Home Medications Prior to Admission medications   Medication Sig Start Date End Date Taking? Authorizing Provider  pantoprazole (PROTONIX) 40 MG tablet Take 1 tablet (40 mg total) by mouth daily. 03/18/22 05/17/22 Yes Noemi Chapel, MD  alfuzosin (UROXATRAL) 10 MG 24 hr tablet TAKE 1 TABLET BY MOUTH EVERY DAY 08/01/21   Summerlin, Berneice Heinrich, PA-C  Ascorbic Acid (VITAMIN C PO) Take 1 tablet by mouth daily.    [provider]  aspirin EC 81 MG tablet Take 81 mg by mouth daily.    [provider]  diclofenac (VOLTAREN) 75 MG EC tablet Take 75 mg by mouth 2 (two) times daily as needed. 06/25/21   [provider]  diltiazem (CARDIZEM CD) 180 MG 24 hr capsule TAKE 1 CAPSULE EVERY MORNING 08/05/21   Sherran Needs, NP  sildenafil (VIAGRA) 100 MG tablet TAKE 1 TABLET (100 MG TOTAL) BY MOUTH DAILY AS NEEDED. 09/18/21   Pased[place], Dewaine Conger, FNP  valACYclovir (VALTREX) 500 MG  tablet Take 1 tablet by mouth daily. 11/12/21   Renee Rival, FNP      Allergies    Patient has no known allergies.    Review of Systems   Review of Systems  Gastrointestinal:  Positive for hematochezia.  All other systems reviewed and are negative.   Physical Exam Updated Vital Signs BP (!) 126/93   Pulse 61   Temp 98.5 F (36.9 C) (Oral)   Resp 18   Ht 1.88 m ('6\' 2"'$ )   Wt 89.8 kg   SpO2 100%   BMI 25.42 kg/m  Physical Exam Vitals and nursing note reviewed.  Constitutional:      General: He is not in acute distress.    Appearance: He is well-developed.  HENT:     Head: Normocephalic and atraumatic.     Mouth/Throat:     Pharynx: No oropharyngeal exudate.  Eyes:     General: No scleral icterus.       Right eye: No discharge.        Left eye: No discharge.     Conjunctiva/sclera: Conjunctivae normal.     Pupils: Pupils are equal, round, and reactive to light.  Neck:     Thyroid: No thyromegaly.     Vascular: No JVD.  Cardiovascular:     Rate and Rhythm: Normal rate and regular rhythm.     Heart  sounds: Normal heart sounds. No murmur heard.    No friction rub. No gallop.  Pulmonary:     Effort: Pulmonary effort is normal. No respiratory distress.     Breath sounds: Normal breath sounds. No wheezing or rales.  Abdominal:     General: Bowel sounds are normal. There is no distension.     Palpations: Abdomen is soft. There is no mass.     Tenderness: There is no abdominal tenderness.  Musculoskeletal:        General: No tenderness. Normal range of motion.     Cervical back: Normal range of motion and neck supple.  Lymphadenopathy:     Cervical: No cervical adenopathy.  Skin:    General: Skin is warm and dry.     Findings: No erythema or rash.  Neurological:     Mental Status: He is alert.     Coordination: Coordination normal.  Psychiatric:        Behavior: Behavior normal.     ED Results / Procedures / Treatments   Labs (all labs ordered are  listed, but only abnormal results are displayed) Labs Reviewed  COMPREHENSIVE METABOLIC PANEL - Abnormal; Notable for the following components:      Result Value   Glucose, Bld 109 (*)    AST 59 (*)    ALT 65 (*)    All other components within normal limits  CBC - Abnormal; Notable for the following components:   RBC 4.09 (*)    Hemoglobin 12.8 (*)    HCT 36.8 (*)    All other components within normal limits  PROTIME-INR  POC OCCULT BLOOD, ED  TYPE AND SCREEN    EKG None  Radiology No results found.  Procedures Procedures    Medications Ordered in ED Medications  0.9 %  sodium chloride infusion (0 mLs Intravenous Stopped 03/18/22 2012)  pantoprazole (PROTONIX) injection 40 mg (40 mg Intravenous Given 03/18/22 1849)  sodium chloride 0.9 % bolus 1,000 mL (0 mLs Intravenous Stopped 03/18/22 1951)    ED Course/ Medical Decision Making/ A&P                           Medical Decision Making Amount and/or Complexity of Data Reviewed Labs: ordered.  Risk Prescription drug management.   This patient presents to the ED for concern of rectal bleeding, this involves William extensive number of treatment options, and is a complaint that carries with it a high risk of complications and morbidity.  The differential diagnosis includes diverticulosis seems less likely given negative colonoscopy a few years ago.  This could be related to William arteriovenous malformation or hemorrhoidal bleeding though the patient states it is very maroon like and mixed in with the stools.  He does have some abdominal cramping which also suggest luminal blood.  Upper GI bleeding could be a source if there is rapid transit but he has no nausea or vomiting   Co morbidities that complicate the patient evaluation  Aspirin   Additional history obtained:  Additional history obtained from electronic medical record External records from outside source obtained and reviewed including evaluation of prior  colonoscopy, see HPI   Lab Tests:  I Ordered, and personally interpreted labs.  The pertinent results include: Metabolic panel unremarkable, no uremia, CBC shows minimal reduce in hemoglobin to 12.8, normal platelets     Cardiac Monitoring: / EKG:  The patient was maintained on a cardiac monitor.  I personally viewed  and interpreted the cardiac monitored which showed William underlying rhythm of: Normal heart rhythm, no tachycardia   Consultations Obtained:  I requested consultation with the gastroenterologist Dr. Gala Romney,  and discussed lab and imaging findings as well as pertinent plan - they recommend: Patient follow-up, no need for inpatient treatment given the patient is very stable   Problem List / ED Course / Critical interventions / Medication management  Patient will be recommended to start on William antiacid medication such as Protonix and follow-up with GI, call for Singh in the morning I ordered medication including Protonix for outpatient, also recommended stool softener Reevaluation of the patient after these medicines showed that the patient stable I have reviewed the patients home medicines and have made adjustments as needed   Social Determinants of Health:  None   Test / Admission - Considered:  Anticipated discharge home, considered admission but after discussion with GI patient stable for discharge         Final Clinical Impression(s) / ED Diagnoses Final diagnoses:  Lower GI bleeding    Rx / DC Orders ED Discharge Orders          Ordered    pantoprazole (PROTONIX) 40 MG tablet  Daily        03/18/22 2014              Noemi Chapel, MD 03/18/22 2014

## 2022-03-24 ENCOUNTER — Encounter: Payer: Self-pay | Admitting: Gastroenterology

## 2022-03-24 ENCOUNTER — Ambulatory Visit (INDEPENDENT_AMBULATORY_CARE_PROVIDER_SITE_OTHER): Payer: 59 | Admitting: Gastroenterology

## 2022-03-24 ENCOUNTER — Telehealth: Payer: Self-pay | Admitting: *Deleted

## 2022-03-24 VITALS — BP 116/74 | HR 66 | Temp 97.3°F | Ht 74.0 in | Wt 201.4 lb

## 2022-03-24 DIAGNOSIS — R748 Abnormal levels of other serum enzymes: Secondary | ICD-10-CM

## 2022-03-24 DIAGNOSIS — R1310 Dysphagia, unspecified: Secondary | ICD-10-CM

## 2022-03-24 DIAGNOSIS — K625 Hemorrhage of anus and rectum: Secondary | ICD-10-CM

## 2022-03-24 DIAGNOSIS — K219 Gastro-esophageal reflux disease without esophagitis: Secondary | ICD-10-CM | POA: Diagnosis not present

## 2022-03-24 MED ORDER — FAMOTIDINE 20 MG PO TABS: 20.0000 mg | ORAL_TABLET | Freq: Every day | ORAL | 2 refills | Status: DC

## 2022-03-24 NOTE — Progress Notes (Signed)
GI Office Note    Referring Provider: Renee Rival, FNP Primary Care Physician:  Renee Rival, FNP Primary Gastroenterologist: Dr. Gala Romney  Date:  03/24/2022  ID:  William Kim, DOB 11-10-1965, MRN [id number]   Chief Complaint   Chief Complaint  Patient presents with   Rectal Bleeding    Pt here for rectal bleeding for past 4 or 5 days    History of Present Illness  William Kim is a 56 y.o. male with a history of mildly elevated transaminases over the past several years with negative hepatitis C antibody in 2022, history of alcohol use presenting today with complaint of rectal bleeding.  Last seen in the office 03/12/2022 for follow-up of LFTs.  Statin previously held but LFTs persistently elevated.  LDL 130, previously 91.  HDL low at 32.  Hepatitis a and B-.  Needs hep a/B vaccinations.  Previous work-up with no evidence of iron overload.  Liver normal with CBD measuring 7 mm on recent ultrasound imaging.  Reported he was taking diclofenac and ibuprofen for back pain as needed and protein shakes daily.  Also drinking Venema energy drinks once to twice on weekends.  Denies any alcohol use.  Also no exercise.   Today: Noticed it started last week and went to the ER and was given pantoprazole. He thinks that has been making him have less energy and overall feeling worse. He is concerned about the side effects. Stools were very dark but not black. Did not have pain initially but was having some right sided discomfort as well. 1/10 in nature. No major pains. Not noticing much currently. Thinks it may be going away. Does have a history of reflux and has  gas pains mid chest at times. Stools are maroon colored mostly. Started taking iron about 2 days ago. Reports a history of iron deficiency. Stopped taking them last year. Did start back taking his simvastatin and stopped drinking his venom energy drink due to the high sugar content. Does have feeling of pills getting stuck  mid chest. After his neck surgery he reports that he has that sensation. He has to pound on his chest. Takes B12 and magnesium supplements as well.  Denies any overt constipation or diarrhea.  Also denies any lack of appetite or early satiety.   Current Outpatient Medications  Medication Sig Dispense Refill   alfuzosin (UROXATRAL) 10 MG 24 hr tablet TAKE 1 TABLET BY MOUTH EVERY DAY 30 tablet 11   Ascorbic Acid (VITAMIN C PO) Take 1 tablet by mouth daily.     aspirin EC 81 MG tablet Take 81 mg by mouth daily.     diclofenac (VOLTAREN) 75 MG EC tablet Take 75 mg by mouth 2 (two) times daily as needed.     diltiazem (CARDIZEM CD) 180 MG 24 hr capsule TAKE 1 CAPSULE EVERY MORNING 90 capsule 3   famotidine (PEPCID) 20 MG tablet Take 1 tablet (20 mg total) by mouth daily. 30 tablet 2   sildenafil (VIAGRA) 100 MG tablet TAKE 1 TABLET (100 MG TOTAL) BY MOUTH DAILY AS NEEDED. 30 tablet 2   valACYclovir (VALTREX) 500 MG tablet Take 1 tablet by mouth daily. 90 tablet 1   No current facility-administered medications for this visit.    Past Medical History:  Diagnosis Date   A-fib [facility])    Anemia    [employer] 12/26/2019   Anxiety    Atrial fibrillation ([place])    Cervicalgia    Encounter for screening  colonoscopy 07/02/2018   High cholesterol    Hypertension    Obesity (BMI 30-39.9) 05/31/2018   OSA (obstructive sleep apnea) 05/31/2018   Severe OSA with AHI 79/h with significant oxygen desaturations as low as 58%.   Sleep apnea     Past Surgical History:  Procedure Laterality Date   COLONOSCOPY WITH PROPOFOL N/A 08/02/2018   normal   NECK SURGERY     NERVE SURGERY     None      Family History  Problem Relation Age of Onset   Hypertension Mother    Healthy Father    Colon cancer Neg Hx    Colon polyps Neg Hx    Liver disease Neg Hx     Allergies as of 03/24/2022   (No Known Allergies)    Social History   Socioeconomic History   Marital status: Widowed    Spouse name: Archie Patten     Number of children: 2   Years of education: Not on file   Highest education level: 12th grade  Occupational History   Occupation: welder  Tobacco Use   Smoking status: Former    Types: Cigarettes   Smokeless tobacco: Never   Tobacco comments:    quit smoking in 2012/2013, quit smoking August 16th  Vaping Use   Vaping Use: Never used  Substance and Sexual Activity   Alcohol use: Not Currently    Comment: stopped 5 years ago   Drug use: No   Sexual activity: Yes  Other Topics Concern   Not on file  Social History Narrative      Wear seat belt    Smoke detectors   Does not use phone while driving    Social Determinants of Health   Financial Resource Strain: Low Risk  (05/31/2019)   Overall Financial Resource Strain (CARDIA)    Difficulty of Paying Living Expenses: Not hard at all  Food Insecurity: No Food Insecurity (05/31/2019)   Hunger Vital Sign    Worried About Running Out of Food in the Last Year: Never true    [place] in the Last Year: Never true  Transportation Needs: No Transportation Needs (05/31/2019)   PRAPARE - Hydrologist (Medical): No    Lack of Transportation (Non-Medical): No  Physical Activity: Insufficiently Active (05/31/2019)   Exercise Vital Sign    Days of Exercise per Week: 5 days    Minutes of Exercise per Session: 20 min  Stress: Stress Concern Present (05/31/2019)   [place]    Feeling of Stress : To some extent  Social Connections: Moderately Isolated (05/31/2019)   Social Connection and Isolation Panel [NHANES]    Frequency of Communication with Friends and Family: Three times a week    Frequency of Social Gatherings with Friends and Family: Three times a week    Attends Religious Services: Never    Active Member of Clubs or Organizations: No    Attends Archivist Meetings: Never    Marital Status: Married     Review of Systems    Gen: Denies fever, chills, anorexia. Denies fatigue, weakness, weight loss.  CV: Denies chest pain, palpitations, syncope, peripheral edema, and claudication. Resp: Denies dyspnea at rest, cough, wheezing, coughing up blood, and pleurisy. GI: See HPI Derm: Denies rash, itching, dry skin Psych: Denies depression, anxiety, memory loss, confusion. No homicidal or suicidal ideation.  Heme: Denies bruising, bleeding, and enlarged lymph nodes.  Physical Exam   BP 116/74   Pulse 66   Temp (!) 97.3 F (36.3 C)   Ht _0  (1.88 m)   Wt 201 lb 6.4 oz (91.4 kg)   BMI 25.86 kg/m   General:   Alert and oriented. No distress noted. Pleasant and cooperative.  Head:  Normocephalic and atraumatic. Eyes:  Conjuctiva clear without scleral icterus. Mouth:  Oral mucosa pink and moist. Good dentition. No lesions. Lungs:  Clear to auscultation bilaterally. No wheezes, rales, or rhonchi. No distress.  Heart:  S1, S2 present without murmurs appreciated.  Abdomen:  +BS, soft, non-tender and non-distended. No rebound or guarding. No HSM or masses noted. Rectal: deferred Msk:  Symmetrical without gross deformities. Normal posture. Extremities:  Without edema. Neurologic:  Alert and  oriented x4 Psych:  Alert and cooperative. Normal mood and affect.   Assessment  William Kim is a 56 y.o. male with a history of A-fib, anemia, anxiety, sleep apnea, HTN, HLD, and elevated transaminases presenting today with rectal bleeding.  Rectal bleeding: Darker colored stools began last week and went to the ED due to feeling lightheaded and dizzy while at work.  He reports some intermittent abdominal cramping but primarily has noticed it on the right side described as 1/10 on pain scale.  He has a history of alcohol use currently uses ibuprofen or diclofenac as needed for leg pain typically a couple times per week but not on a daily basis.  Stools have been described as a dark/maroon color.  Most recent labs in  the ED 03/18/2022 with stable hemoglobin at 12.8 (stable since August 2022), BUN not elevated.  Not likely rapid upper GI bleed.  He was sent home prescription for pantoprazole 40 mg daily.  He reports concerns about the side effects of this medication and has been feeling more tired recently.  Reports he takes B12 and magnesium supplements daily.  Has a history of anemia and 2 days ago started back taking his iron.  FOBT not collected given patient recently started iron.  He does feel as though he has had less maroon-colored stool over the last few days.  Denies any rectal pain.  Last colonoscopy in 2020 with completely normal exam. We discussed that the appropriate treatment for something like gastritis, duodenitis, peptic ulcer disease that could be contributing to his bleeding is best treated with a PPI.  Other differential includes AVM or other vascular abnormality. For now we will settle with famotidine 20 mg daily to help with acid suppression.  He is to continue to monitor his symptoms for any worsening fatigue, shortness of breath, chest pain, lightheadedness, or syncope.  ED precautions discussed.  We will proceed with upper endoscopy in the near future for further evaluation of this along with his reflux/dysphagia  Reflux/Dysphagia: Gas issues mid chest and feeling that it is trapped on a daily basis. Also sensation of pills being stuck mid chest.  He does have a history of anterior cervical neck procedure being formed and states that he has had this issue with pills getting stuck for a while as well as intermittent indigestion.  He does report some improvement of his indigestion since starting pantoprazole but would not like to continue it due to side effects.  We discussed trying famotidine 20 mg daily to help with acid suppression/blocking.  He is in agreement with this.  He ultimately may need a different PPI therapy in the future, possibly extra vitamin B12 supplementation since this can affect  absorption.  Given his intermittent dysphagia, difficulty with belching, and intermittent reflux we will proceed with upper endoscopy in the near future for further evaluation at patient's request.  May consider possible dilation if needed.  Elevated transaminases: Consistently elevated since 2021.  Negative work-up for viral hepatitis.  Thought to be possibly secondary to statin use however when stopping it his enzymes remained elevated.  Since his last visit on 03/12/2022 he has resumed his statin.  Most recent labs performed while inpatient in the ED with AST 59, ALT 65, alk phos 77, T. bili 0.8.  Original plan was to reevaluate labs in 3 months and consider additional testing at that time.  He was previously recommended hepatitis A and B vaccinations which also advised today.  PLAN   Continue simvastatin Recommend hepatitis A/B vaccinations. Proceed with upper endoscopy +/- dilation with propofol by Dr. Gala Romney in near future: the risks, benefits, and alternatives have been discussed with the patient in detail. The patient states understanding and desires to proceed. ASA 2 GERD diet/lifestyle modifications Would like to try famotidine 20 mg daily and stop pantoprazole due to side effects.  Follow up 3 months with AB    Venetia Night, MSN, FNP-BC, AGACNP-BC [facility]

## 2022-03-24 NOTE — Patient Instructions (Addendum)
We are scheduling you for an upper endoscopy with Dr. Gala Romney in the near future to further evaluate your reflux/swallowing issue and your darker/maroon-colored stools.  For pain I would prefer for you to take Tylenol rather than NSAIDs.  NSAIDs such as ibuprofen, Aleve, Advil, BC Goody powders, and diclofenac can contribute to gastritis and cause ulcers in the stomach.  The preferred treatment for possible bleeding ulcer or gastritis is a proton pump inhibitor such as pantoprazole, omeprazole, esomeprazole, etc.  Given concerns about side effects, I want you to start taking famotidine 20 mg daily to help with acid suppression.  Reassuringly your most recent lab values are stable.  If symptoms worsen or you develop any ongoing abdominal pain, lightheadedness, dizziness, or feelings like you may pass out, please head to the ED for further evaluation.  As previously recommended by Vicente Males, I recommend that you get hepatitis a and B vaccinations.  You can get this done with your PCP or local pharmacy.  If symptoms worsen, please not hesitate to contact the office.  Otherwise we will have you follow-up as already recommended in 3 months with Vicente Males to discuss your elevated liver enzymes and follow-up post procedure.

## 2022-03-24 NOTE — Telephone Encounter (Signed)
[facility] to schedule EGD +/-ED with Dr.Rourk ASA 2

## 2022-03-28 ENCOUNTER — Encounter: Payer: Self-pay | Admitting: Nurse Practitioner

## 2022-04-04 ENCOUNTER — Encounter: Payer: Self-pay | Admitting: *Deleted

## 2022-04-04 IMAGING — DX DG CHEST 2V
2 series · 2 of 2 positions shown · non-contrast
Comparison: 07/09/2019

CLINICAL DATA: Shortness of breath

EXAM:
CHEST - 2 VIEW

[chest pa]
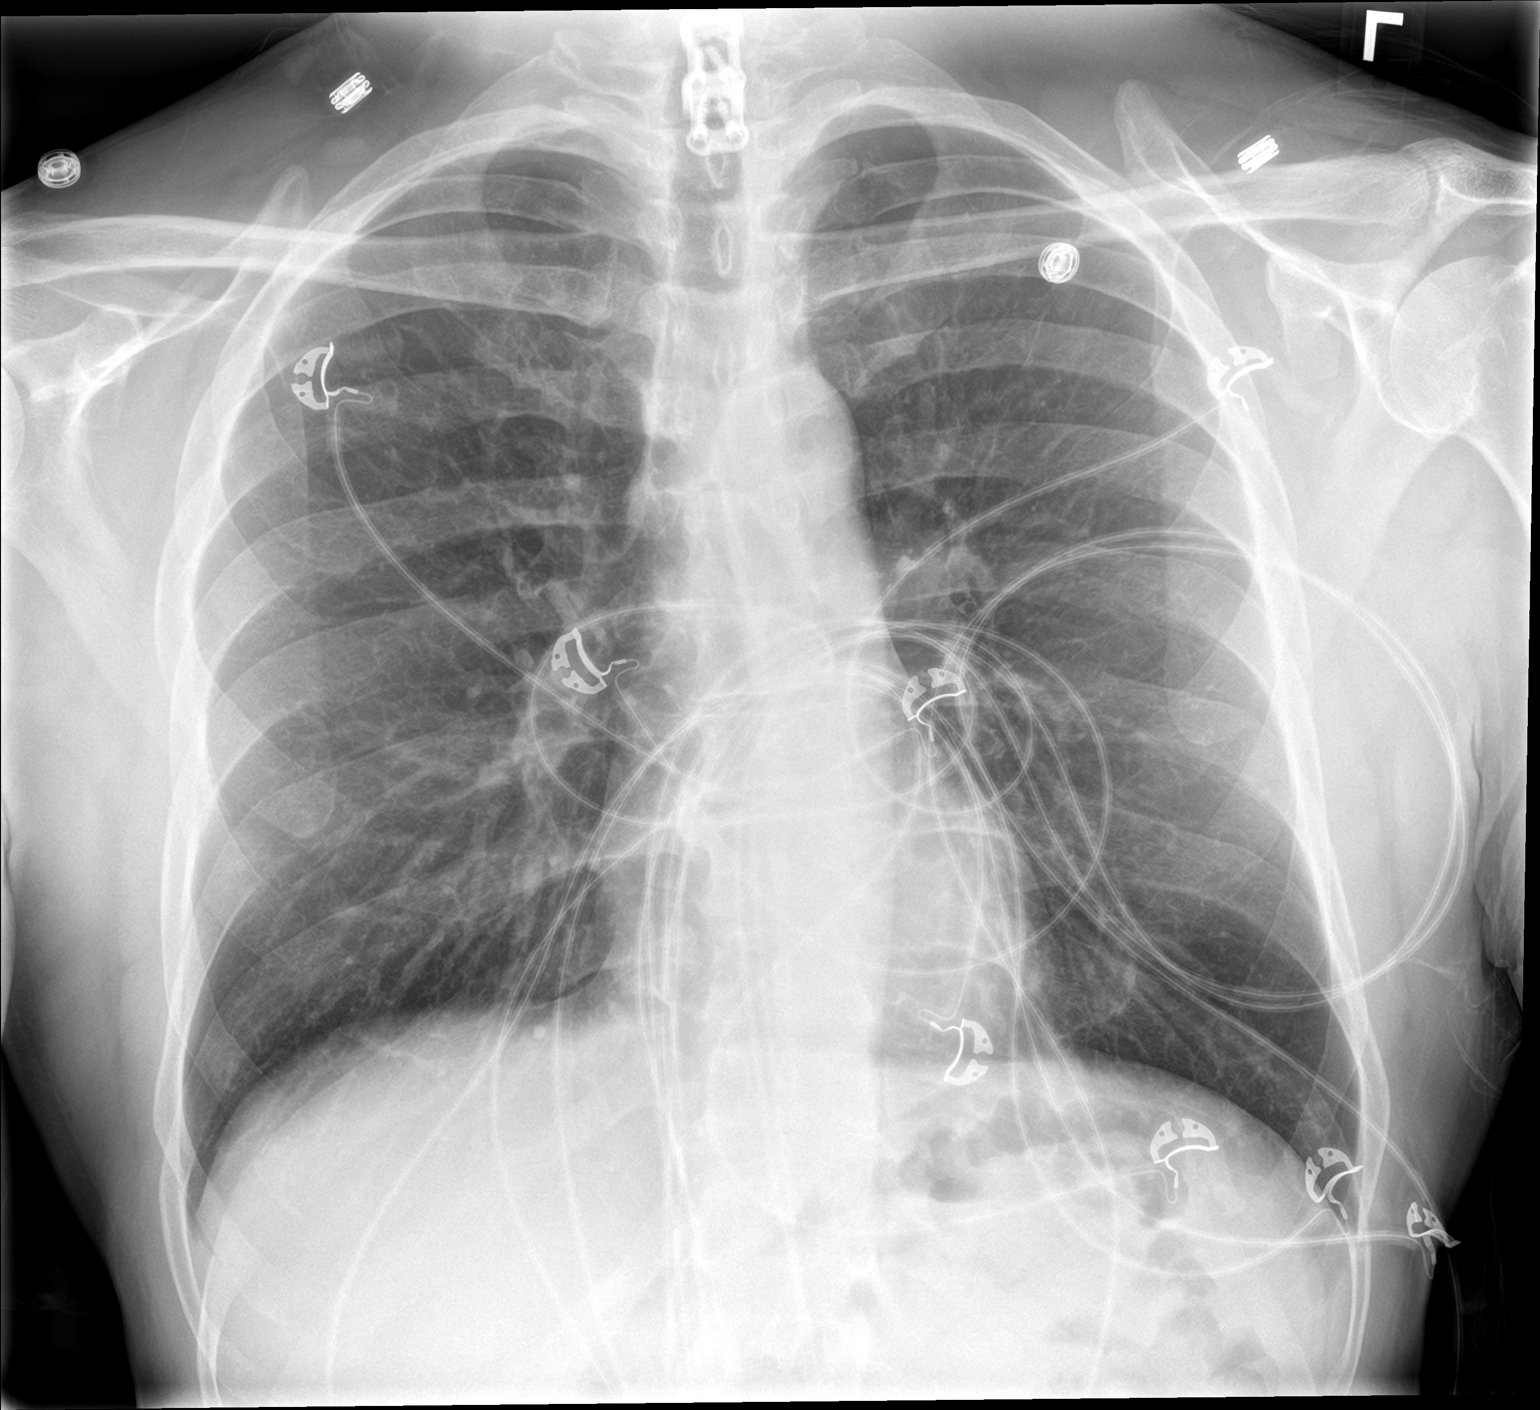

[chest lat]
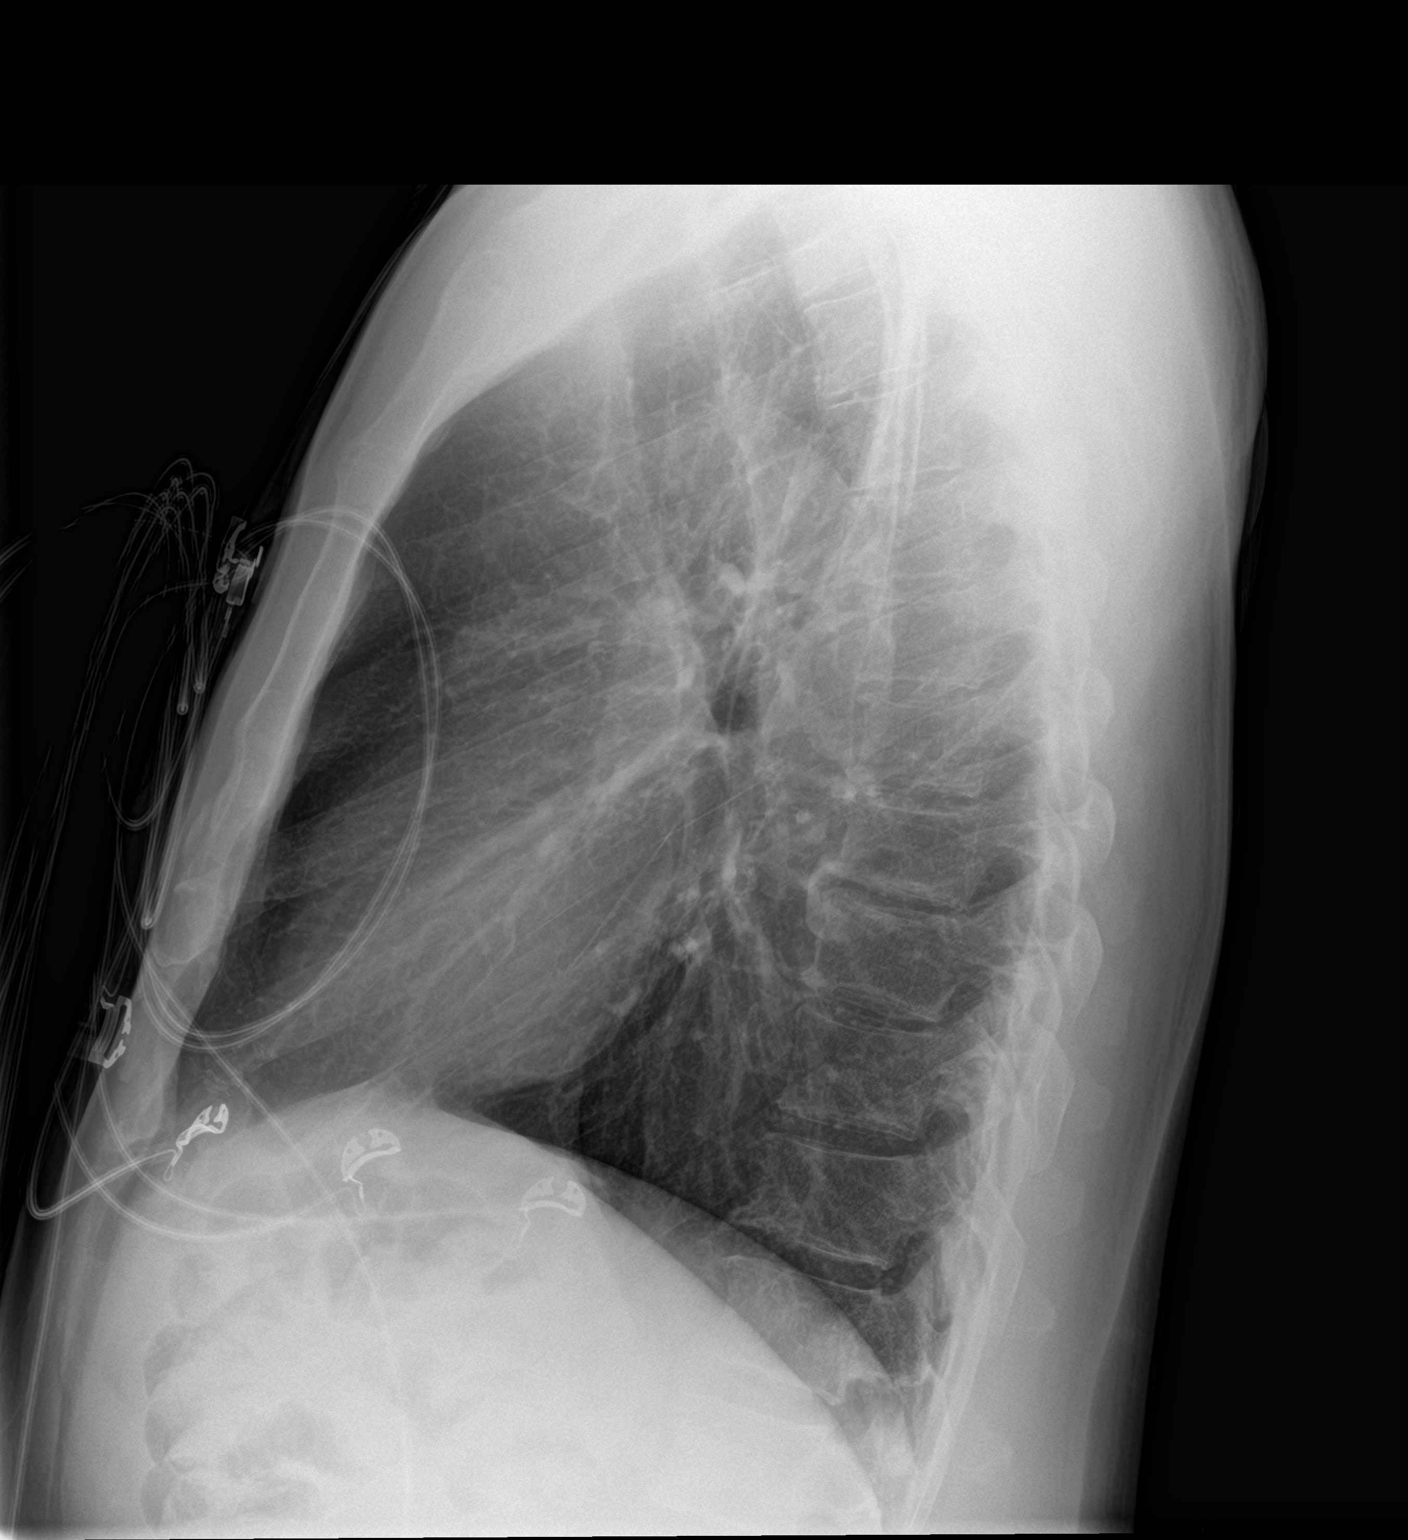

[2 of 2 positions shown; findings below may reference images not displayed]

FINDINGS: The heart size and mediastinal contours are within normal limits.
Both lungs are clear. The visualized skeletal structures are
unremarkable.
IMPRESSION: No active cardiopulmonary disease.

## 2022-04-04 NOTE — Telephone Encounter (Signed)
Pt has been scheduled for 05/15/22 at 11am. Instructions mailed to pt

## 2022-04-14 ENCOUNTER — Ambulatory Visit: Payer: 59 | Admitting: Gastroenterology

## 2022-05-11 ENCOUNTER — Other Ambulatory Visit: Payer: Self-pay | Admitting: Gastroenterology

## 2022-05-11 DIAGNOSIS — K625 Hemorrhage of anus and rectum: Secondary | ICD-10-CM

## 2022-05-11 DIAGNOSIS — K219 Gastro-esophageal reflux disease without esophagitis: Secondary | ICD-10-CM

## 2022-05-15 ENCOUNTER — Encounter: Payer: Self-pay | Admitting: Internal Medicine

## 2022-05-15 ENCOUNTER — Ambulatory Visit (HOSPITAL_COMMUNITY): Payer: 59 | Admitting: Certified Registered Nurse Anesthetist

## 2022-05-15 ENCOUNTER — Telehealth: Payer: Self-pay

## 2022-05-15 ENCOUNTER — Ambulatory Visit (HOSPITAL_COMMUNITY)
Admission: RE | Admit: 2022-05-15 | Discharge: 2022-05-15 | Disposition: A | Discharge status: Home / Self Care | Payer: 59 | Attending: Internal Medicine | Admitting: Internal Medicine

## 2022-05-15 ENCOUNTER — Other Ambulatory Visit: Payer: Self-pay

## 2022-05-15 ENCOUNTER — Encounter (HOSPITAL_COMMUNITY): Payer: Self-pay | Admitting: Internal Medicine

## 2022-05-15 ENCOUNTER — Ambulatory Visit (HOSPITAL_BASED_OUTPATIENT_CLINIC_OR_DEPARTMENT_OTHER): Payer: 59 | Admitting: Certified Registered Nurse Anesthetist

## 2022-05-15 ENCOUNTER — Encounter (HOSPITAL_COMMUNITY)
Admission: RE | Disposition: A | Discharge status: Home / Self Care | Payer: Self-pay | Source: Home / Self Care | Attending: Internal Medicine

## 2022-05-15 DIAGNOSIS — G4733 Obstructive sleep apnea (adult) (pediatric): Secondary | ICD-10-CM | POA: Insufficient documentation

## 2022-05-15 DIAGNOSIS — K2980 Duodenitis without bleeding: Secondary | ICD-10-CM | POA: Insufficient documentation

## 2022-05-15 DIAGNOSIS — I10 Essential (primary) hypertension: Secondary | ICD-10-CM | POA: Diagnosis not present

## 2022-05-15 DIAGNOSIS — Z87891 Personal history of nicotine dependence: Secondary | ICD-10-CM | POA: Diagnosis not present

## 2022-05-15 DIAGNOSIS — G4730 Sleep apnea, unspecified: Secondary | ICD-10-CM | POA: Diagnosis not present

## 2022-05-15 DIAGNOSIS — R1310 Dysphagia, unspecified: Secondary | ICD-10-CM | POA: Insufficient documentation

## 2022-05-15 DIAGNOSIS — I4891 Unspecified atrial fibrillation: Secondary | ICD-10-CM | POA: Diagnosis not present

## 2022-05-15 HISTORY — PX: ESOPHAGOGASTRODUODENOSCOPY (EGD) WITH PROPOFOL: SHX5813

## 2022-05-15 HISTORY — PX: MALONEY DILATION: SHX5535

## 2022-05-15 HISTORY — PX: BIOPSY: SHX5522

## 2022-05-15 SURGERY — ESOPHAGOGASTRODUODENOSCOPY (EGD) WITH PROPOFOL
Anesthesia: General

## 2022-05-15 MED ORDER — PROPOFOL 10 MG/ML IV BOLUS
INTRAVENOUS | Status: DC | PRN
  Administered 2022-05-15 (×3): 50 mg via INTRAVENOUS
  Administered 2022-05-15: 100 mg via INTRAVENOUS

## 2022-05-15 MED ORDER — LACTATED RINGERS IV SOLN: INTRAVENOUS | Status: DC

## 2022-05-15 MED ORDER — STERILE WATER FOR IRRIGATION IR SOLN
Status: DC | PRN
  Administered 2022-05-15: 120 mL

## 2022-05-15 MED ORDER — LACTATED RINGERS IV SOLN: INTRAVENOUS | Status: DC | PRN

## 2022-05-15 MED ORDER — LIDOCAINE HCL (CARDIAC) PF 100 MG/5ML IV SOSY
PREFILLED_SYRINGE | INTRAVENOUS | Status: DC | PRN
  Administered 2022-05-15: 60 mg via INTRATRACHEAL

## 2022-05-15 MED ORDER — RABEPRAZOLE SODIUM 20 MG PO TBEC: 20.0000 mg | DELAYED_RELEASE_TABLET | Freq: Every day | ORAL | 11 refills | Status: DC

## 2022-05-15 NOTE — Op Note (Signed)
[facility] Patient Name: William Kim Procedure Date: 05/15/2022 9:21 AM MRN: [id number] Date of Birth: July 15, 1965 Attending MD: Norvel Richards , MD, [id number] CSN: [id number] Age: 56 Admit Type: Outpatient Procedure:                Upper GI endoscopy Indications:              Dysphagia Providers:                Norvel Richards, MD, Rosina Lowenstein, RN,                            Everardo Pacific Referring MD:              Medicines:                Propofol per Anesthesia Complications:            No immediate complications. Estimated Blood Loss:     Estimated blood loss was minimal. Procedure:                Pre-Anesthesia Assessment:                           - Prior to the procedure, a History and Physical                            was performed, and patient medications and                            allergies were reviewed. The patient's tolerance of                            previous anesthesia was also reviewed. The risks                            and benefits of the procedure and the sedation                            options and risks were discussed with the patient.                            All questions were answered, and informed consent                            was obtained. Prior Anticoagulants: The patient has                            taken no anticoagulant or antiplatelet agents. ASA                            Grade Assessment: II - A patient with mild systemic                            disease. After reviewing the risks and benefits,  the patient was deemed in satisfactory condition to                            undergo the procedure.                           After obtaining informed consent, the endoscope was                            passed under direct vision. Throughout the                            procedure, the patient's blood pressure, pulse, and                            oxygen saturations were  monitored continuously. The                            GIF-H190 ([id number]) scope was introduced through the                            mouth, and advanced to the second part of duodenum.                            The upper GI endoscopy was accomplished without                            difficulty. The patient tolerated the procedure                            well. Scope In: 9:41:27 AM Scope Out: 9:50:26 AM Total Procedure Duration: 0 hours 8 minutes 59 seconds  Findings:      The examined esophagus was normal. Gastric cavity empty.      The entire examined stomach was normal. Pylorus patent.      Examination the first and second portion of the duodenum revealed an 8       mm adenomatous appearing nodule in the mid second portion of the       duodenum. This did not appear to be the ampulla. In fact, the ampulla       was identified on the medial wall of the second portion of the duodenum.       Please see photos.The scope was withdrawn. Dilation was performed with a       Maloney dilator with mild resistance at 56 Fr. The dilation site was       examined following endoscope reinsertion and showed no change. Estimated       blood loss: none. Finally, the nodule in the second portion of the       duodenum was biopsied for histologic study. Impression:               - Normal esophagus. Dilated.                           - Normal stomach.                           -  Duodenal nodule?"biopsied Moderate Sedation:      Moderate (conscious) sedation was personally administered by an       anesthesia professional. The following parameters were monitored: oxygen       saturation, heart rate, blood pressure, and response to care. Recommendation:           - Patient has a contact number available for                            emergencies. The signs and symptoms of potential                            delayed complications were discussed with the                            patient. Return to  normal activities tomorrow.                            Written discharge instructions were provided to the                            patient.                           - Advance diet as tolerated. Follow-up on                            pathology. Patient would benefit from better acid                            suppression therapy. We will start him on                            rabeprazole or Aciphex 20 mg pill 130 minutes                            before breakfast daily. New prescription provided                            to his pharmacy. Office visit with [place] in 4 to 6                            weeks. Procedure Code(s):        --- Professional ---                           [phone], Esophagogastroduodenoscopy, flexible,                            transoral; diagnostic, including collection of                            specimen(s) by brushing or washing, when performed                            (separate procedure)  43450, Dilation of esophagus, by unguided sound or                            bougie, single or multiple passes Diagnosis Code(s):        --- Professional ---                           R13.10, Dysphagia, unspecified CPT copyright 2022 American Medical Association. All rights reserved. The codes documented in this report are preliminary and upon coder review may  be revised to meet current compliance requirements. Cristopher Estimable. Jaymes Hang, MD Norvel Richards, MD 05/15/2022 10:03:30 AM This report has been signed electronically. Number of Addenda: 0

## 2022-05-15 NOTE — H&P (Signed)
$'@LOGO'v$ @   Primary Care Physician:  Renee Rival, FNP Primary Gastroenterologist:  Dr. Gala Romney  Pre-Procedure History & Physical: HPI:  William Kim is a 56 y.o. male here for  further evaluation of recent black stools esophageal dysphagia.  History of NSAID and alcohol use.  Hemoglobin is remained stable in the 12 range over the past 1 year.  Past Medical History:  Diagnosis Date   A-fib ([place])    Anemia    Phreesia 12/26/2019   Anxiety    Atrial fibrillation ([place])    Cervicalgia    Encounter for screening colonoscopy 07/02/2018   High cholesterol    Hypertension    Obesity (BMI 30-39.9) 05/31/2018   OSA (obstructive sleep apnea) 05/31/2018   Severe OSA with AHI 79/h with significant oxygen desaturations as low as 58%.   Sleep apnea     Past Surgical History:  Procedure Laterality Date   COLONOSCOPY WITH PROPOFOL N/A 08/02/2018   normal   NECK SURGERY     NERVE SURGERY     None      Prior to Admission medications   Medication Sig Start Date End Date Taking? Authorizing Provider  alfuzosin (UROXATRAL) 10 MG 24 hr tablet TAKE 1 TABLET BY MOUTH EVERY DAY 08/01/21  Yes [place]lin, Berneice Heinrich, PA-C  Ascorbic Acid (VITAMIN C PO) Take 1 tablet by mouth daily.   Yes [provider]  aspirin EC 81 MG tablet Take 81 mg by mouth daily.   Yes [provider]  diltiazem (CARDIZEM CD) 180 MG 24 hr capsule TAKE 1 CAPSULE EVERY MORNING 08/05/21  Yes Sherran Needs, NP  famotidine (PEPCID) 20 MG tablet TAKE 1 TABLET BY MOUTH EVERY DAY 05/12/22  Yes Mahon, Lenise Arena, NP  valACYclovir (VALTREX) 500 MG tablet Take 1 tablet by mouth daily. 11/12/21  Yes Paseda, Dewaine Conger, FNP  diclofenac (VOLTAREN) 75 MG EC tablet Take 75 mg by mouth 2 (two) times daily as needed. 06/25/21   [provider]  sildenafil (VIAGRA) 100 MG tablet TAKE 1 TABLET (100 MG TOTAL) BY MOUTH DAILY AS NEEDED. 09/18/21   Renee Rival, FNP    Allergies as of 04/04/2022   (No  Known Allergies)    Family History  Problem Relation Age of Onset   Hypertension Mother    Healthy Father    Colon cancer Neg Hx    Colon polyps Neg Hx    Liver disease Neg Hx     Social History   Socioeconomic History   Marital status: Widowed    Spouse name: Archie Patten    Number of children: 2   Years of education: Not on file   Highest education level: 12th grade  Occupational History   Occupation: welder  Tobacco Use   Smoking status: Former    Types: Cigarettes   Smokeless tobacco: Never   Tobacco comments:    quit smoking in 2012/2013, quit smoking August 16th  Vaping Use   Vaping Use: Never used  Substance and Sexual Activity   Alcohol use: Not Currently    Comment: stopped 5 years ago   Drug use: No   Sexual activity: Yes  Other Topics Concern   Not on file  Social History Narrative      Wear seat belt    Smoke detectors   Does not use phone while driving    Social Determinants of Health   Financial Resource Strain: Low Risk  (05/31/2019)   Overall Financial Resource Strain (CARDIA)  Difficulty of Paying Living Expenses: Not hard at all  Food Insecurity: No Food Insecurity (05/31/2019)   Hunger Vital Sign    Worried About Running Out of Food in the Last Year: Never true    Ran Out of Food in the Last Year: Never true  Transportation Needs: No Transportation Needs (05/31/2019)   PRAPARE - Hydrologist (Medical): No    Lack of Transportation (Non-Medical): No  Physical Activity: Insufficiently Active (05/31/2019)   Exercise Vital Sign    Days of Exercise per Week: 5 days    Minutes of Exercise per Session: 20 min  Stress: Stress Concern Present (05/31/2019)   [place]    Feeling of Stress : To some extent  Social Connections: Moderately Isolated (05/31/2019)   Social Connection and Isolation Panel [NHANES]    Frequency of Communication with Friends and Family:  Three times a week    Frequency of Social Gatherings with Friends and Family: Three times a week    Attends Religious Services: Never    Active Member of Clubs or Organizations: No    Attends Archivist Meetings: Never    Marital Status: Married  Human resources officer Violence: Not At Risk (05/31/2019)   Humiliation, Afraid, Rape, and Kick questionnaire    Fear of Current or Ex-Partner: No    Emotionally Abused: No    Physically Abused: No    Sexually Abused: No    Review of Systems: See HPI, otherwise negative ROS  Physical Exam: BP 117/82   Pulse 63   Temp 98.6 F (37 C) (Oral)   Resp 12   Ht 6' (1.829 m)   Wt 88.5 kg   SpO2 99%   BMI 26.45 kg/m  General:   Alert,  Well-developed, well-nourished, pleasant and cooperative in NAD Sly. No significant cervical adenopathy. Lungs:  Clear throughout to auscultation.   No wheezes, crackles, or rhonchi. No acute distress. Heart:  Regular rate and rhythm; no murmurs, clicks, rubs,  or gallops. Abdomen: Non-distended, normal bowel sounds.  Soft and nontender without appreciable mass or hepatosplenomegaly.  Pulses:  Normal pulses noted. Extremities:  Without clubbing or edema.  Impression/Plan:    56 year old gentleman with esophageal dysphagia patient describes melena by history but hemoglobin stable.  Recent NSAID use alcohol exposure.  Patient is here for an EGD with esophageal dilation as feasible/appropriate per plan. The risks, benefits, limitations, alternatives and imponderables have been reviewed with the patient. Potential for esophageal dilation, biopsy, etc. have also been reviewed.  Questions have been answered. All parties agreeable.      Notice: This dictation was prepared with Dragon dictation along with smaller phrase technology. Any transcriptional errors that result from this process are unintentional and may not be corrected upon review.

## 2022-05-15 NOTE — Anesthesia Postprocedure Evaluation (Signed)
Anesthesia Post Note  Patient: William Kim  Procedure(s) Performed: ESOPHAGOGASTRODUODENOSCOPY (EGD) WITH PROPOFOL [place]  Patient location during evaluation: Phase II Anesthesia Type: General Level of consciousness: awake and alert Pain management: pain level controlled Vital Signs Assessment: post-procedure vital signs reviewed and stable Respiratory status: spontaneous breathing, nonlabored ventilation, respiratory function stable and patient connected to nasal cannula oxygen Cardiovascular status: blood pressure returned to baseline and stable Postop Assessment: no apparent nausea or vomiting Anesthetic complications: no   No notable events documented.   Last Vitals:  Vitals:   05/15/22 0952 05/15/22 0958  BP: 97/60 (!) 105/58  Pulse: 66 73  Resp: 11 18  Temp: (!) 36.4 C   SpO2: 100% 100%    Last Pain:  Vitals:   05/15/22 0958  TempSrc:   PainSc: 0-No pain                 [place] Spees Clyde Canterbury

## 2022-05-15 NOTE — Telephone Encounter (Signed)
-----   Message from Daneil Dolin, MD sent at 05/15/2022  9:55 AM EST -----  EGD today.  I am stopping famotidine.  Please call in a new prescription for Aciphex or rabeprazole 20 mg pill.  Take (1 ) 30 minutes before breakfast daily dispense 30 with 11 refills.  Thanks.

## 2022-05-15 NOTE — Telephone Encounter (Signed)
Famotidine has been discontinued and new Rx has been sent in to pharmacy on file.

## 2022-05-15 NOTE — Anesthesia Preprocedure Evaluation (Signed)
Anesthesia Evaluation  Patient identified by MRN, date of birth, ID band Patient awake    Reviewed: Allergy & Precautions, H&P , NPO status , Patient's Chart, lab work & pertinent test results, reviewed documented beta blocker date and time   Airway Mallampati: II  TM Distance: >3 FB Neck ROM: full    Dental  (+) Chipped   Pulmonary sleep apnea , former smoker   Pulmonary exam normal breath sounds clear to auscultation       Cardiovascular Exercise Tolerance: Good hypertension, + dysrhythmias Atrial Fibrillation  Rhythm:regular Rate:Normal     Neuro/Psych  Headaches  negative psych ROS   GI/Hepatic negative GI ROS, Neg liver ROS,,,  Endo/Other  negative endocrine ROS    Renal/GU negative Renal ROS  negative genitourinary   Musculoskeletal   Abdominal   Peds  Hematology  (+) Blood dyscrasia, anemia   Anesthesia Other Findings   Reproductive/Obstetrics negative OB ROS                             Anesthesia Physical Anesthesia Plan  ASA: 3  Anesthesia Plan: General   Post-op Pain Management:    Induction:   PONV Risk Score and Plan:   Airway Management Planned:   Additional Equipment:   Intra-op Plan:   Post-operative Plan:   Informed Consent: I have reviewed the patients History and Physical, chart, labs and discussed the procedure including the risks, benefits and alternatives for the proposed anesthesia with the patient or authorized representative who has indicated his/her understanding and acceptance.     Dental Advisory Given  Plan Discussed with: CRNA  Anesthesia Plan Comments:         Anesthesia Quick Evaluation

## 2022-05-15 NOTE — Transfer of Care (Signed)
Immediate Anesthesia Transfer of Care Note  Patient: William Kim  Procedure(s) Performed: ESOPHAGOGASTRODUODENOSCOPY (EGD) WITH PROPOFOL MALONEY DILATION BIOPSY  Patient Location: Short Stay  Anesthesia Type:General  Level of Consciousness: sedated  Airway & Oxygen Therapy: Patient Spontanous Breathing  Post-op Assessment: Report given to RN and Post -op Vital signs reviewed and stable  Post vital signs: Reviewed and stable  Last Vitals:  Vitals Value Taken Time  BP 100/60   Temp 98   Pulse 65   Resp 16   SpO2 96     Last Pain:  Vitals:   05/15/22 0938  TempSrc:   PainSc: 0-No pain      Patients Stated Pain Goal: 7 (95/18/84 1660)  Complications: No notable events documented.

## 2022-05-15 NOTE — Discharge Instructions (Addendum)
EGD Discharge instructions Please read the instructions outlined below and refer to this sheet in the next few weeks. These discharge instructions provide you with general information on caring for yourself after you leave the hospital. Your doctor may also give you specific instructions. While your treatment has been planned according to the most current medical practices available, unavoidable complications occasionally occur. If you have any problems or questions after discharge, please call your doctor. ACTIVITY You may resume your regular activity but move at a slower pace for the next 24 hours.  Take frequent rest periods for the next 24 hours.  Walking will help expel (get rid of) the air and reduce the bloated feeling in your abdomen.  No driving for 24 hours (because of the anesthesia (medicine) used during the test).  You may shower.  Do not sign any important legal documents or operate any machinery for 24 hours (because of the anesthesia used during the test).  NUTRITION Drink plenty of fluids.  You may resume your normal diet.  Begin with a light meal and progress to your normal diet.  Avoid alcoholic beverages for 24 hours or as instructed by your caregiver.  MEDICATIONS You may resume your normal medications unless your caregiver tells you otherwise.  WHAT YOU CAN EXPECT TODAY You may experience abdominal discomfort such as a feeling of fullness or "gas" pains.  FOLLOW-UP Your doctor will discuss the results of your test with you.  SEEK IMMEDIATE MEDICAL ATTENTION IF ANY OF THE FOLLOWING OCCUR: Excessive nausea (feeling sick to your stomach) and/or vomiting.  Severe abdominal pain and distention (swelling).  Trouble swallowing.  Temperature over 101 F (37.8 C).  Rectal bleeding or vomiting of blood.      Your esophagus and stomach look good today.  I stretched your esophagus  Small nodule in your duodenum (small intestine).  Biopsies taken.    I recommend you need  better treatment for acid reflux as follows:  Stop famotidine; begin new prescription for rabeprazole or Aciphex 20 mg pill take 1 daily 30 minutes before breakfast.  My office will send a new prescription to your pharmacy  Further recommendations to follow pending review of pathology report  Office visit with [place]ia Night in 4 to 6 weeks.      OFFICE WILL CONTACT YOU REGARDING FOLLOW UP APPOINTMENT, YOU CAN ALSO CHECK [place]   At patient request, I called Maura Crandall at [phone] -

## 2022-05-16 LAB — SURGICAL PATHOLOGY

## 2022-05-19 ENCOUNTER — Other Ambulatory Visit: Payer: Self-pay | Admitting: Nurse Practitioner

## 2022-05-19 ENCOUNTER — Encounter: Payer: Self-pay | Admitting: Internal Medicine

## 2022-05-26 ENCOUNTER — Encounter (HOSPITAL_COMMUNITY): Payer: Self-pay | Admitting: Internal Medicine

## 2022-06-16 ENCOUNTER — Other Ambulatory Visit: Payer: Self-pay | Admitting: Nurse Practitioner

## 2022-06-17 ENCOUNTER — Ambulatory Visit: Payer: 59 | Admitting: Urology

## 2022-06-20 ENCOUNTER — Encounter (HOSPITAL_COMMUNITY): Payer: Self-pay | Admitting: Nurse Practitioner

## 2022-06-20 ENCOUNTER — Ambulatory Visit (HOSPITAL_COMMUNITY)
Admission: RE | Admit: 2022-06-20 | Discharge: 2022-06-20 | Disposition: A | Discharge status: Home / Self Care | Payer: 59 | Source: Ambulatory Visit | Attending: Nurse Practitioner | Admitting: Nurse Practitioner

## 2022-06-20 VITALS — BP 140/88 | HR 70 | Ht 72.0 in | Wt 198.2 lb

## 2022-06-20 DIAGNOSIS — I480 Paroxysmal atrial fibrillation: Secondary | ICD-10-CM | POA: Diagnosis not present

## 2022-06-20 DIAGNOSIS — Z79899 Other long term (current) drug therapy: Secondary | ICD-10-CM | POA: Insufficient documentation

## 2022-06-20 DIAGNOSIS — Z7982 Long term (current) use of aspirin: Secondary | ICD-10-CM | POA: Diagnosis not present

## 2022-06-20 DIAGNOSIS — I4891 Unspecified atrial fibrillation: Secondary | ICD-10-CM

## 2022-06-20 DIAGNOSIS — R0602 Shortness of breath: Secondary | ICD-10-CM | POA: Insufficient documentation

## 2022-06-20 DIAGNOSIS — I10 Essential (primary) hypertension: Secondary | ICD-10-CM | POA: Diagnosis not present

## 2022-06-20 NOTE — Progress Notes (Signed)
Primary Care Physician: Noreene Larsson, NP Referring Physician: [facility] ER f/u   William Kim is a 56 y.o. male with a h/o HTN, alcohol use and untreated probable sleep apnea. It is documented in Epic in 2014 that he was treated for afib in the ER. The pt reports that he would have issues with irregular heart beat off and on since then but episodes would be short lived. He noticed the episodes were more frequent over the last few weeks and recently present to the ER, 7/31, with afib with RVR.He was given IV diltiazem and converted. Discharged home on xarelto 20 mg for a chadsvasc score of 1 and daily diltiazem 120 mg a day.  In the afib clinic, 8/3, He is in [place]. No further afib. He was drinking 6 alcoholic drinks a night and was told in the ER to diminish alcohol intake. He also reports significant snoring/ apnea and about 8 years ago, had a sleep study but when the cpap mask was applied, he did not like the feeling and left the test. 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 He denies tobacco or caffeine use.  F/u in afib clinic 8/27. He is in [place]. Has noted a few short lived episodes of irregular heart beat. He will finish xarelto for a chadsvasc score of 1(htn). Echo showed Mild LVH. He has tolerated addition of diltiazem daily. Sleep study is pending. He has cut back on alcohol but has not had cessation.  F/u afib clinic, he had repeated sleep study and is pending results of test. Still  notices some episodes of afib lasting around 15-20 mins. Still has cut down on alcohol but two nights ago had 2 beers and a margarita. He is exercising more and has lost 4 lbs. He is now off anticoagulation.  F/u in afib clinic, 05/15/17. He continues with alcohol abuse, drinking a beer and a margarita every night. He has been found to have sleep apnea and is pending getting his CPAP. He notices some irregular heart rate intermittently but it sounds more consistent with premature contractions.   F/u in afib  clinic, 06/13/19. Was referred back from PCP to refill diltiazem. I have not seen since 2018.  Pt has been struggling for several months of anxiety/panic attacks and has been to the ER 2x in November with feeling of heart racing, impending doom.  He states that he had shoulder surgery in June and has had more time at the house, being on short term disability, watching TV, with the constant issues with covid reporting, contributing to anxiety. He is not due to go back to work until after 1//21. When he has his attacks he is aware of impending doom, heart racing. No ekg's in the ER showed afib. I do not see any evidence of afib since I last saw pt. He has also lost from 230 to 180 lb, which he contributes to anxiety decreasing his appetite. He saw his PCP recently and was referred to behavioral health. He has not heard back re appointment date yet. He was given hydroxyzine 25 mg  to use when necessary.   F/u in afib clinic, 06/12/20. He has had a good year without any appreciable afib. He was placed on Wellbutrin last year with his anxiety now  under good control. He is trying to watch what he eats and exercise on his treadmill several times a week. BP elevated today, states yesterday at [facility] visit, it was in range.  F/u  in afib clinic 06/12/21. He reports that his wife died of a one car auto accident,in July, left the road and hit a tree and was dead at the scene. He notices some heart racing intermittently. At the same time he was asking to stop pm diltiazem.  He is agreeable for a zio patch to be placed before I make the decision to stop pm med.   F/u in afib clinic, 06/20/22. When I saw him last year, a Zio monitor was placed and he did not have any afib  His pm dose of diltiazem  was stopped. This year he reports that he was in the ED for rectal bleeding in September. He is on asa but not on anticoagulation with a CHA2DS2VASc  score of 1 and he has not noted any afib. He still has some stomach issues and can  stop asa. He also notices some shortness of breath with certain activities but can get on his step master and exercise vigorously  and feel good without any chest pain or shortness of breath. He has internationally lost weight since last year. Tries to stay in good shape.   Today, he denies symptoms of palpitations, chest pain, shortness of breath, orthopnea, PND, lower extremity edema, dizziness, presyncope, syncope, or neurologic sequela. The patient is tolerating medications without difficulties and is otherwise without complaint today.   Past Medical History:  Diagnosis Date   A-fib ([place])    Anemia    Phreesia 12/26/2019   Anxiety    Atrial fibrillation ([place])    Cervicalgia    Encounter for screening colonoscopy 07/02/2018   High cholesterol    Hypertension    Obesity (BMI 30-39.9) 05/31/2018   OSA (obstructive sleep apnea) 05/31/2018   Severe OSA with AHI 79/h with significant oxygen desaturations as low as 58%.   Sleep apnea    Past Surgical History:  Procedure Laterality Date   COLONOSCOPY WITH PROPOFOL N/A 08/02/2018   Procedure: COLONOSCOPY WITH PROPOFOL;  Surgeon: Daneil Dolin, MD;  [facility];  Service: Endoscopy;  Laterality: N/A;  12:15pm   NECK SURGERY     NERVE SURGERY     None      Current Outpatient Medications  Medication Sig Dispense Refill   alfuzosin (UROXATRAL) 10 MG 24 hr tablet Take 1 tablet (10 mg total) by mouth in the morning and at bedtime. BID (Patient taking differently: Take 10 mg by mouth daily. BID) 180 tablet 3   Ascorbic Acid (VITAMIN C PO) Take 1 tablet by mouth daily.     aspirin EC 81 MG tablet Take 81 mg by mouth daily.     buPROPion (WELLBUTRIN XL) 300 MG 24 hr tablet TAKE ONE TABLET BY MOUTH EVERY MORNING FOR MOOD    TAKE AT BREAKFAST     diltiazem (CARDIZEM CD) 120 MG 24 hr capsule TAKE 1 CAPSULE AT BEDTIME (TAKE 180 MG IN THE MORNING) 90 capsule 3   diltiazem (CARDIZEM CD) 180 MG 24 hr capsule TAKE 1 CAPSULE EVERY MORNING 90  capsule 3   sildenafil (VIAGRA) 100 MG tablet TAKE 1 TABLET (100 MG TOTAL) BY MOUTH DAILY AS NEEDED. 10 tablet 0   simvastatin (ZOCOR) 10 MG tablet Take 1 tablet (10 mg total) by mouth at bedtime. 90 tablet 0   valACYclovir (VALTREX) 500 MG tablet TAKE 1 TABLET DAILY 90 tablet 3   No current facility-administered medications for this encounter.    No Known Allergies  Social History   Socioeconomic History  Marital status: Widowed    Spouse name: Archie Patten    Number of children: 2   Years of education: Not on file   Highest education level: 12th grade  Occupational History   Occupation: welder  Tobacco Use   Smoking status: Former    Types: Cigarettes   Smokeless tobacco: Never   Tobacco comments:    quit smoking in 2012/2013, quit smoking August 16th  Vaping Use   Vaping Use: Never used  Substance and Sexual Activity   Alcohol use: Not Currently    Comment: stopped 3-4 months ago   Drug use: No   Sexual activity: Not on file  Other Topics Concern   Not on file  Social History Narrative   Lives with wife Archie Patten married 3/10 32 years   2 children   Son 41 lives in [place], 1 grandbaby 89 months old 2020   Daughter 67 she is living at home      Enjoys fishing      Diet: eats veggies and salmon, avoid red meats   Caffeine: does not drink    Water: 2 bottles day       Wear seat belt    Smoke detectors   Does not use phone while driving    Social Determinants of Health   Financial Resource Strain: Not on file  Food Insecurity: Not on file  Transportation Needs: Not on file  Physical Activity: Not on file  Stress: Not on file  Social Connections: Not on file  Intimate Partner Violence: Not on file    Family History  Problem Relation Age of Onset   Healthy Mother    Healthy Father    Colon cancer Neg Hx    Colon polyps Neg Hx     ROS- All systems are reviewed and negative except as per the HPI above  Physical Exam: Vitals:   06/12/21 1547  BP: (!) 128/92   Pulse: 72  Weight: 90.4 kg  Height: 6' (1.829 m)   Wt Readings from Last 3 Encounters:  06/12/21 90.4 kg  05/14/21 90.7 kg  02/14/21 90.3 kg    Labs: Lab Results  Component Value Date   NA 140 02/15/2021   K 3.9 02/15/2021   CL 104 02/15/2021   CO2 23 02/15/2021   GLUCOSE 81 02/15/2021   BUN 16 02/15/2021   CREATININE 1.20 02/15/2021   CALCIUM 9.2 02/15/2021   No results found for: INR Lab Results  Component Value Date   CHOL 142 02/15/2021   HDL 39 (L) 02/15/2021   LDLCALC 92 02/15/2021   TRIG 50 02/15/2021     GEN- The patient is well appearing, alert and oriented x 3 today.   Head- normocephalic, atraumatic Eyes-  Sclera clear, conjunctiva pink Ears- hearing intact Oropharynx- clear Neck- supple, no JVP Lymph- no cervical lymphadenopathy Lungs- Clear to ausculation bilaterally, normal work of breathing Heart- Regular rate and rhythm, no murmurs, rubs or gallops, PMI not laterally displaced GI- soft, NT, ND, + BS Extremities- no clubbing, cyanosis, or edema MS- no significant deformity or atrophy Skin- no rash or lesion Psych- euthymic mood, full affect Neuro- strength and sensation are intact  EKG-Vent. rate 70 BPM PR interval 188 ms QRS duration 92 ms QT/QTcB 360/388 ms P-R-T axes 71 55 2 Normal sinus rhythm Normal ECG When compared with ECG of 12-Jun-2021 15:49, PREVIOUS ECG IS PRESENT Epic records reviewed Echo- 2018-Study Conclusions   - Left ventricle: The cavity size was normal. Wall thickness was  increased in a pattern of mild LVH. Systolic function was normal.   The estimated ejection fraction was in the range of 60% to 65%.   Low normal GLPSS at -18%. Wall motion was normal; there were no   regional wall motion abnormalities. Doppler parameters are   consistent with abnormal left ventricular relaxation (grade 1   diastolic dysfunction). The E/e&' ratio is between 8-15,   suggesting indeterminate LV filling pressure. - Mitral valve:  Mildly thickened leaflets . There was trivial   regurgitation. - Left atrium: The atrium was normal in size. - Inferior vena cava: The vessel was normal in size. The   respirophasic diameter changes were in the normal range (>= 50%),   consistent with normal central venous pressure.   Impressions:   - LVEF 60-65%, mild LVH, normal wall thickness, normal wall motion,   grade 1 DD, indeterminate LV filling pressure, normal LA Size,   normal IVC.    Assessment and Plan: 1. Paroxysmal afib I have seen no evidence of afib since 2018 Continue diltiazem 180 mg am   Off xarelto, with a chadsvasc score of 1, can stop  81 mg ASA with recent stomach  issues  Occassional shortness of breath, update echo   2. HTN Stable    3. Lifestyle measures No alcohol use  He is eating  better, less salt, healthy choices He is exercising as well and has lost weight    4. Anxiety Under control on Wellbutrin   Otherwise, f/u in one year   Butch Penny C. Marjoria Mancillas, [facility] [address] [place], [place] [address] [phone]

## 2022-06-20 NOTE — Patient Instructions (Signed)
Stop aspirin.

## 2022-06-27 ENCOUNTER — Ambulatory Visit: Payer: 59 | Admitting: Urology

## 2022-07-01 ENCOUNTER — Ambulatory Visit: Payer: 59 | Admitting: Gastroenterology

## 2022-07-07 ENCOUNTER — Encounter: Payer: Self-pay | Admitting: Gastroenterology

## 2022-07-07 ENCOUNTER — Ambulatory Visit (INDEPENDENT_AMBULATORY_CARE_PROVIDER_SITE_OTHER): Payer: 59 | Admitting: Gastroenterology

## 2022-07-07 VITALS — BP 146/87 | HR 70 | Temp 98.1°F | Ht 72.0 in | Wt 194.4 lb

## 2022-07-07 DIAGNOSIS — R1310 Dysphagia, unspecified: Secondary | ICD-10-CM | POA: Diagnosis not present

## 2022-07-07 DIAGNOSIS — R748 Abnormal levels of other serum enzymes: Secondary | ICD-10-CM

## 2022-07-07 DIAGNOSIS — K625 Hemorrhage of anus and rectum: Secondary | ICD-10-CM

## 2022-07-07 DIAGNOSIS — K219 Gastro-esophageal reflux disease without esophagitis: Secondary | ICD-10-CM

## 2022-07-07 DIAGNOSIS — R110 Nausea: Secondary | ICD-10-CM

## 2022-07-07 NOTE — Progress Notes (Signed)
GI Office Note    Referring Provider: Renee Rival, FNP Primary Care Physician:  Renee Rival, FNP Primary Gastroenterologist: Cristopher Estimable.Rourk, MD  Date:  07/07/2022  ID:  William Kim, DOB 03-Mar-1966, MRN [id number]   Chief Complaint   Chief Complaint  Patient presents with   Follow-up    Felt nauseated this morning. It has happened a couple times in the past couple months or so.    History of Present Illness  William Kim is a 57 y.o. male with a history of mildly elevated transaminases over the past several years with negative hepatitis C antibody in 2022, history of alcohol use, anemia, anxiety, A-fib, HTN, and OSA presenting today for follow-up post EGD.  Colonoscopy 08/02/2018: -Normal colon -No rectal abnormalities -Repeat TCS in 10 years for screening purposes  Last seen in the office 03/24/2022 for concern of rectal bleeding.  Had reported darker colored stools which began a week prior.  Had a ED visit due to lightheadedness and dizziness and intermittent abdominal cramping.  Using ibuprofen ordered Clofenax as needed for leg pain couple times per week.  Stools described as dark/maroon-colored.  Hemoglobin 12.8 in the ED.  Has reported more fatigue.  Last TCS in 2020 with completely normal exam.  Continues to take B12 and magnesium supplements daily.  Having gas for like a strap on a daily basis and sensation of pills getting stuck mid chest.  Had noticed some improvement with pantoprazole but declined resuming due to possible side effects.  He agreed on famotidine for acid suppression.  Given his dark stools and reflux/dysphagia he was scheduled for EGD.  Recent mildly elevated LFTs with AST 59, ALT 65.  Reinforced recommendation of hepatitis A and hepatitis B vaccination.  Prior workup with negative hepatitis A, B, or C.  GERD diet/modifications reinforced.  Advised to resume simvastatin.  EGD 05/15/2022: -Normal esophagus s/p dilation -Normal stomach -8  mm duodenal nodule -Duodenal nodule biopsy consistent with peptic duodenitis -Advised PPI with Aciphex 20 mg daily -Follow-up in 4 to 6 weeks  Today: Rectal bleeding: None. Thinks it may have been due to beets and beet juice that he was drinking/eating fairly often. Has not seen anything else since procedures. Only seen it after he ate beats. Sometimes eats them at home a few times a week.   GERD: Taking rabeprazole at 8:30 am in the morning. Gets up at 3:15 in the morning. Got nauseated this morning and felt weak like. Reports about 3 months ago it occurred as well. Felt shaky with it. Usually eats yogurt and blueberries or oatmeal and blueberries usually. Gets to work at 5 and this happened about 7am. Still feels a little queezy but  overall much better. Reports when it happened 3 months ago he had drank coffee and things he may have gotten hungry. Reports it is a strong coffee. Also has no strength when this occurs. Has had to lay down both times. No vomiting. Snacks in the morning before his "lunch break". Tries to avoid bad foods. Denies overt abdominal pain.    Current Outpatient Medications  Medication Sig Dispense Refill   alfuzosin (UROXATRAL) 10 MG 24 hr tablet Take 10 mg by mouth at bedtime.     Ascorbic Acid (VITAMIN C PO) Take 1 tablet by mouth daily.     diltiazem (CARDIZEM CD) 180 MG 24 hr capsule TAKE 1 CAPSULE EVERY MORNING 90 capsule 3   hydrOXYzine (VISTARIL) 50 MG capsule TAKE ONE CAPSULE BY MOUTH  TWICE A DAY AS NEEDED FOR ANXIETY AND PANIC ATTACKS     RABEprazole (ACIPHEX) 20 MG tablet Take 1 tablet (20 mg total) by mouth daily. 30 tablet 11   sildenafil (VIAGRA) 100 MG tablet TAKE 1 TABLET (100 MG TOTAL) BY MOUTH DAILY AS NEEDED. 30 tablet 2   simvastatin (ZOCOR) 10 MG tablet Take 10 mg by mouth at bedtime.     valACYclovir (VALTREX) 500 MG tablet TAKE 1 TABLET (500 MG TOTAL) BY MOUTH DAILY. 30 tablet 2   No current facility-administered medications for this visit.     Past Medical History:  Diagnosis Date   A-fib ([place])    Anemia    Phreesia 12/26/2019   Anxiety    Atrial fibrillation ([place])    Cervicalgia    Encounter for screening colonoscopy 07/02/2018   High cholesterol    Hypertension    Obesity (BMI 30-39.9) 05/31/2018   OSA (obstructive sleep apnea) 05/31/2018   Severe OSA with AHI 79/h with significant oxygen desaturations as low as 58%.   Sleep apnea     Past Surgical History:  Procedure Laterality Date   BIOPSY  05/15/2022   Procedure: BIOPSY;  Surgeon: Daneil Dolin, MD;  [facility];  Service: Endoscopy;;   COLONOSCOPY WITH PROPOFOL N/A 08/02/2018   normal   ESOPHAGOGASTRODUODENOSCOPY (EGD) WITH PROPOFOL N/A 05/15/2022   Procedure: ESOPHAGOGASTRODUODENOSCOPY (EGD) WITH PROPOFOL;  Surgeon: Daneil Dolin, MD;  [facility];  Service: Endoscopy;  Laterality: N/A;  11:45 am   MALONEY DILATION N/A 05/15/2022   Procedure: Venia Minks DILATION;  Surgeon: Daneil Dolin, MD;  [facility];  Service: Endoscopy;  Laterality: N/A;   NECK SURGERY     NERVE SURGERY     None      Family History  Problem Relation Age of Onset   Hypertension Mother    Healthy Father    Colon cancer Neg Hx    Colon polyps Neg Hx    Liver disease Neg Hx     Allergies as of 07/07/2022   (No Known Allergies)    Social History   Socioeconomic History   Marital status: Widowed    Spouse name: Archie Patten    Number of children: 2   Years of education: Not on file   Highest education level: 12th grade  Occupational History   Occupation: welder  Tobacco Use   Smoking status: Former    Types: Cigarettes   Smokeless tobacco: Never   Tobacco comments:    quit smoking in 2012/2013, quit smoking August 16th  Vaping Use   Vaping Use: Never used  Substance and Sexual Activity   Alcohol use: Not Currently    Comment: stopped 5 years ago   Drug use: No   Sexual activity: Yes  Other Topics Concern   Not on file  Social  History Narrative      Wear seat belt    Smoke detectors   Does not use phone while driving    Social Determinants of Health   Financial Resource Strain: Low Risk  (05/31/2019)   Overall Financial Resource Strain (CARDIA)    Difficulty of Paying Living Expenses: Not hard at all  Food Insecurity: No Food Insecurity (05/31/2019)   Hunger Vital Sign    Worried About Running Out of Food in the Last Year: Never true    [place] in the Last Year: Never true  Transportation Needs: No Transportation Needs (05/31/2019)   [place] -  Hydrologist (Medical): No    Lack of Transportation (Non-Medical): No  Physical Activity: Insufficiently Active (05/31/2019)   Exercise Vital Sign    Days of Exercise per Week: 5 days    Minutes of Exercise per Session: 20 min  Stress: Stress Concern Present (05/31/2019)   [place]    Feeling of Stress : To some extent  Social Connections: Moderately Isolated (05/31/2019)   Social Connection and Isolation Panel [NHANES]    Frequency of Communication with Friends and Family: Three times a week    Frequency of Social Gatherings with Friends and Family: Three times a week    Attends Religious Services: Never    Active Member of Clubs or Organizations: No    Attends Archivist Meetings: Never    Marital Status: Married     Review of Systems   Gen: Denies fever, chills, anorexia. Denies fatigue, weakness, weight loss.  CV: Denies chest pain, palpitations, syncope, peripheral edema, and claudication. Resp: Denies dyspnea at rest, cough, wheezing, coughing up blood, and pleurisy. GI: See HPI Derm: Denies rash, itching, dry skin Psych: Denies depression, anxiety, memory loss, confusion. No homicidal or suicidal ideation.  Heme: Denies bruising, bleeding, and enlarged lymph nodes.   Physical Exam   BP (!) 146/87 (BP Location: Right Arm, Patient  Position: Sitting, Cuff Size: Normal)   Pulse 70   Temp 98.1 F (36.7 C) (Temporal)   Ht 6' (1.829 m)   Wt 194 lb 6.4 oz (88.2 kg)   SpO2 100%   BMI 26.37 kg/m   General:   Alert and oriented. No distress noted. Pleasant and cooperative.  Head:  Normocephalic and atraumatic. Eyes:  Conjuctiva clear without scleral icterus. Mouth:  Oral mucosa pink and moist. Good dentition. No lesions. Lungs:  Clear to auscultation bilaterally. No wheezes, rales, or rhonchi. No distress.  Heart:  S1, S2 present without murmurs appreciated.  Abdomen:  +BS, soft, non-tender and non-distended. No rebound or guarding. No HSM or masses noted. Rectal: deferred Msk:  Symmetrical without gross deformities. Normal posture. Extremities:  Without edema. Neurologic:  Alert and  oriented x4 Psych:  Alert and cooperative. Normal mood and affect.   Assessment  William Kim is a 57 y.o. male with a history of mildly elevated transaminases over the past several years with negative hepatitis C antibody in 2022, history of alcohol use, anemia, anxiety, A-fib, HTN, and OSA presenting today for follow-up post EGD  GERD, Nausea: Currently taking rabeprazole 20 mg daily.  Not taking until about 8:30 AM which is after he has had yogurt and blueberries or oatmeal blueberries as well as coffee.  Suspect increased caffeine consumption on empty stomach this contributed to his couple episodes of nausea that he has been experiencing.  Could be secondary to worsening reflux.  No extensive workup needed at this time given it only occurred twice in 3 months.  Advised he could use over-the-counter famotidine as needed to help with any nausea/queasy feeling.  We also discussed taking his PPI prior to going into work and consuming coffee as it is meant to take on empty stomach and best taken before eating any sort of trigger food/liquid.  Also advised he may take a protein supplement such as a shake or protein bar to help curb his  appetite better while at work.  Advised him to continue to eat 4-6 small meals daily.  Dysphagia: Improved since dilation on  05/15/2022. Pills not getting stuck as it used to.   Elevated transaminases: Most recent LFTs in September 2023 with AST 51, ALT 60.  Normal T. bili and alk phos.  These are slightly elevated from June 2023.  No longer drinking energy drinks.  Previous workup negative for viral hepatitis.  Trial of discontinuing statin did not improve LFTs.  Previous ultrasound with mildly dilated CBD at 7 mm.  He has been eating better for the last 4 months and has been active.  Has had a 4 pound weight loss in the last couple weeks.  Rectal bleeding: None unless he has beats or beat juice.   PLAN   HFP, if ongoing elevation or worsening elevation we will pursue extensive serologies. Continue rabeprazole 20 mg daily, take on empty stomach prior to coffee (upon getting to work).  Pepcid as needed for any nausea that may occur, can consider zofran in the future if symptoms worsen  GERD diet/lifestyle modifications.  Avoid NSAIDs Regular meals, may supplement with some protein on a daily basis to help with appetite.  Follow up in 4 months, or sooner if needed.    Venetia Night, MSN, FNP-BC, AGACNP-BC [facility]

## 2022-07-07 NOTE — Patient Instructions (Addendum)
As we discussed I would like for you to begin taking your omeprazole prior to going to work and drinking your coffee.  I suspect that the high level of caffeine on an almost empty stomach is contributing to a couple of these nausea episodes that you are having.  You could also stop the instant coffee and only drink a cup to reduce your caffeine consumption.  If needed throughout the day, you could try some instant coffee later on.  Rabeprazole is best taken on empty stomach to help protect the lining.  I would also encourage free to have a little bit of food on her stomach if drinking a large amount of caffeine.  If your symptoms become more frequent or worsen we could consider short course of Zofran to keep on hand.  For now I think Pepcid (famotidine) 10 mg or 20 mg as needed will be sufficient.  It may be good for you to supplement some extra protein throughout your workday to ensure you are not hungry so frequently.  Generally 4-6 small meals a day or better for your reflux than 3 large meals.  Follow a GERD diet:  Avoid fried, fatty, greasy, spicy, citrus foods. Avoid caffeine and carbonated beverages. Avoid chocolate. Try eating 4-6 small meals a day rather than 3 large meals. Do not eat within 3 hours of laying down. Prop head of bed up on wood or bricks to create a 6 inch incline.  I am going to repeat her liver enzymes today.  You may have these performed at [facility].  It is located at [employer]. across the street from [facility] emergency department.  It is located on the second floor.  It was a pleasure to see you today. I want to create trusting relationships with patients. If you receive a survey regarding your visit,  I greatly appreciate you taking time to fill this out on paper or through your MyChart. I value your feedback.  Venetia Night, MSN, FNP-BC, AGACNP-BC [facility]

## 2022-07-08 ENCOUNTER — Ambulatory Visit (HOSPITAL_COMMUNITY)
Admission: RE | Admit: 2022-07-08 | Discharge: 2022-07-08 | Disposition: A | Discharge status: Home / Self Care | Payer: 59 | Source: Ambulatory Visit | Attending: Nurse Practitioner | Admitting: Nurse Practitioner

## 2022-07-08 DIAGNOSIS — I4891 Unspecified atrial fibrillation: Secondary | ICD-10-CM | POA: Diagnosis not present

## 2022-07-08 DIAGNOSIS — Z87891 Personal history of nicotine dependence: Secondary | ICD-10-CM | POA: Diagnosis not present

## 2022-07-08 DIAGNOSIS — I10 Essential (primary) hypertension: Secondary | ICD-10-CM | POA: Insufficient documentation

## 2022-07-08 DIAGNOSIS — E785 Hyperlipidemia, unspecified: Secondary | ICD-10-CM | POA: Diagnosis not present

## 2022-07-08 DIAGNOSIS — G4730 Sleep apnea, unspecified: Secondary | ICD-10-CM | POA: Diagnosis not present

## 2022-07-08 LAB — ECHOCARDIOGRAM COMPLETE
AR max vel: 3.7 cm2
AV Area VTI: 3.47 cm2
AV Area mean vel: 3.35 cm2
AV Mean grad: 3 mmHg
AV Peak grad: 4.8 mmHg
Ao pk vel: 1.1 m/s
Area-P 1/2: 3.17 cm2

## 2022-07-08 NOTE — Progress Notes (Signed)
Echocardiogram 2D Echocardiogram has been performed.  William Kim 07/08/2022, 4:01 PM

## 2022-07-10 ENCOUNTER — Encounter (HOSPITAL_COMMUNITY): Payer: Self-pay | Admitting: *Deleted

## 2022-07-10 LAB — HEPATIC FUNCTION PANEL
AG Ratio: 1.8 (calc) (ref 1.0–2.5)
ALT: 45 U/L (ref 9–46)
AST: 39 U/L — ABNORMAL HIGH (ref 10–35)
Albumin: 4.4 g/dL (ref 3.6–5.1)
Alkaline phosphatase (APISO): 72 U/L (ref 35–144)
Bilirubin, Direct: 0.1 mg/dL (ref 0.0–0.2)
Globulin: 2.4 g/dL (calc) (ref 1.9–3.7)
Indirect Bilirubin: 0.5 mg/dL (calc) (ref 0.2–1.2)
Total Bilirubin: 0.6 mg/dL (ref 0.2–1.2)
Total Protein: 6.8 g/dL (ref 6.1–8.1)

## 2022-07-15 ENCOUNTER — Ambulatory Visit (INDEPENDENT_AMBULATORY_CARE_PROVIDER_SITE_OTHER): Payer: 59 | Admitting: Urology

## 2022-07-15 ENCOUNTER — Encounter: Payer: Self-pay | Admitting: Urology

## 2022-07-15 VITALS — BP 130/86 | HR 71

## 2022-07-15 DIAGNOSIS — N138 Other obstructive and reflux uropathy: Secondary | ICD-10-CM

## 2022-07-15 DIAGNOSIS — N401 Enlarged prostate with lower urinary tract symptoms: Secondary | ICD-10-CM | POA: Diagnosis not present

## 2022-07-15 DIAGNOSIS — R351 Nocturia: Secondary | ICD-10-CM

## 2022-07-15 DIAGNOSIS — Z125 Encounter for screening for malignant neoplasm of prostate: Secondary | ICD-10-CM

## 2022-07-15 LAB — BLADDER SCAN AMB NON-IMAGING: Scan Result: 1

## 2022-07-15 MED ORDER — ALFUZOSIN HCL ER 10 MG PO TB24: 10.0000 mg | ORAL_TABLET | Freq: Every evening | ORAL | 3 refills | Status: DC

## 2022-07-15 NOTE — Patient Instructions (Signed)

## 2022-07-15 NOTE — Progress Notes (Signed)
07/15/2022 4:12 PM   Delfino Lovett Jamey Reas 10/15/1965 [id number]  Referring provider: Renee Rival, [place] [address],  [place] [address]  Followup BPH   HPI: [name] is a 57yo here for followup for BPH with nocturia and prostate cancer screening. IPSS 9 QOL 2 on uroxatral '10mg'$  daily. Nocturia 2-3x. Urine stream strong. No straining to urinate. PSA 1.0 02/2022   PMH: Past Medical History:  Diagnosis Date   A-fib ([place])    Anemia    Phreesia 12/26/2019   Anxiety    Atrial fibrillation ([place])    Cervicalgia    Encounter for screening colonoscopy 07/02/2018   High cholesterol    Hypertension    Obesity (BMI 30-39.9) 05/31/2018   OSA (obstructive sleep apnea) 05/31/2018   Severe OSA with AHI 79/h with significant oxygen desaturations as low as 58%.   Sleep apnea     Surgical History: Past Surgical History:  Procedure Laterality Date   BIOPSY  05/15/2022   Procedure: BIOPSY;  Surgeon: Daneil Dolin, MD;  [facility];  Service: Endoscopy;;   COLONOSCOPY WITH PROPOFOL N/A 08/02/2018   normal   ESOPHAGOGASTRODUODENOSCOPY (EGD) WITH PROPOFOL N/A 05/15/2022   Procedure: ESOPHAGOGASTRODUODENOSCOPY (EGD) WITH PROPOFOL;  Surgeon: Daneil Dolin, MD;  [facility];  Service: Endoscopy;  Laterality: N/A;  11:45 am   MALONEY DILATION N/A 05/15/2022   Procedure: Venia Minks DILATION;  Surgeon: Daneil Dolin, MD;  [facility];  Service: Endoscopy;  Laterality: N/A;   NECK SURGERY     NERVE SURGERY     None      Home Medications:  Allergies as of 07/15/2022   No Known Allergies      Medication List        Accurate as of July 15, 2022  4:12 PM. If you have any questions, ask your nurse or doctor.          alfuzosin 10 MG 24 hr tablet Commonly known as: UROXATRAL Take 10 mg by mouth at bedtime.   diltiazem 180 MG 24 hr capsule Commonly known as: CARDIZEM CD TAKE 1 CAPSULE EVERY MORNING    hydrOXYzine 50 MG capsule Commonly known as: VISTARIL TAKE ONE CAPSULE BY MOUTH TWICE A DAY AS NEEDED FOR ANXIETY AND PANIC ATTACKS   RABEprazole 20 MG tablet Commonly known as: ACIPHEX Take 1 tablet (20 mg total) by mouth daily.   sildenafil 100 MG tablet Commonly known as: VIAGRA TAKE 1 TABLET (100 MG TOTAL) BY MOUTH DAILY AS NEEDED.   simvastatin 10 MG tablet Commonly known as: ZOCOR Take 10 mg by mouth at bedtime.   valACYclovir 500 MG tablet Commonly known as: VALTREX TAKE 1 TABLET (500 MG TOTAL) BY MOUTH DAILY.   VITAMIN C PO Take 1 tablet by mouth daily.        Allergies: No Known Allergies  Family History: Family History  Problem Relation Age of Onset   Hypertension Mother    Healthy Father    Colon cancer Neg Hx    Colon polyps Neg Hx    Liver disease Neg Hx     Social History:  reports that he has quit smoking. His smoking use included cigarettes. He has never used smokeless tobacco. He reports that he does not currently use alcohol. He reports that he does not use drugs.  ROS: All other review of systems were reviewed and are negative except what is noted above in HPI  Physical Exam:  BP 130/86   Pulse 71   Constitutional:  Alert and oriented, No acute distress. HEENT: [place] AT, moist mucus membranes.  Trachea midline, no masses. Cardiovascular: No clubbing, cyanosis, or edema. Respiratory: Normal respiratory effort, no increased work of breathing. GI: Abdomen is soft, nontender, nondistended, no abdominal masses GU: No CVA tenderness.  Lymph: No cervical or inguinal lymphadenopathy. Skin: No rashes, bruises or suspicious lesions. Neurologic: Grossly intact, no focal deficits, moving all 4 extremities. Psychiatric: Normal mood and affect.  Laboratory Data: Lab Results  Component Value Date   WBC 4.0 03/18/2022   HGB 12.8 (L) 03/18/2022   HCT 36.8 (L) 03/18/2022   MCV 90.0 03/18/2022   PLT 186 03/18/2022    Lab Results  Component Value Date    CREATININE 1.01 03/18/2022    Lab Results  Component Value Date   PSA 0.7 12/29/2019    No results found for: "TESTOSTERONE"  Lab Results  Component Value Date   HGBA1C 5.5 03/10/2022    Urinalysis    Component Value Date/Time   COLORURINE YELLOW 10/31/2020 1134   APPEARANCEUR Clear 06/28/2021 1344   LABSPEC 1.011 10/31/2020 1134   PHURINE 9.0 (H) 10/31/2020 1134   GLUCOSEU Negative 06/28/2021 1344   HGBUR NEGATIVE 10/31/2020 1134   BILIRUBINUR Negative 06/28/2021 1344   KETONESUR NEGATIVE 10/31/2020 1134   PROTEINUR Negative 06/28/2021 1344   PROTEINUR NEGATIVE 10/31/2020 1134   UROBILINOGEN 0.2 10/30/2020 1634   NITRITE Negative 06/28/2021 1344   NITRITE NEGATIVE 10/31/2020 1134   LEUKOCYTESUR Negative 06/28/2021 1344   LEUKOCYTESUR NEGATIVE 10/31/2020 1134    Lab Results  Component Value Date   LABMICR Comment 06/28/2021    Pertinent Imaging:  No results found for this or any previous visit.  No results found for this or any previous visit.  No results found for this or any previous visit.  No results found for this or any previous visit.  No results found for this or any previous visit.  No valid procedures specified. No results found for this or any previous visit.  Results for orders placed during the hospital encounter of 10/31/20  CT Renal Stone Study  Narrative CLINICAL DATA:  LEFT flank pain since 10/27/2020, question kidney stone. No history of kidney stones. Denies hematuria.  EXAM: CT ABDOMEN AND PELVIS WITHOUT CONTRAST  TECHNIQUE: Multidetector CT imaging of the abdomen and pelvis was performed following the standard protocol without IV contrast. Sagittal and coronal MPR images reconstructed from axial data set. No oral contrast administered.  COMPARISON:  None  FINDINGS: Lower chest: Minimal RIGHT basilar atelectasis. Lung bases otherwise clear.  Hepatobiliary: Gallbladder and liver normal appearance  Pancreas: Normal  appearance  Spleen: Normal appearance  Adrenals/Urinary Tract: Adrenal glands, kidneys, ureters, and bladder normal appearance.  Stomach/Bowel: Normal appendix. Scattered stool throughout colon. Stomach decompressed. Radiopacity within the small bowel loop in the LEFT mid abdomen image 55 question medication tablet. Bowel loops otherwise unremarkable.  Vascular/Lymphatic: Atherosclerotic calcifications aorta and iliac arteries without aneurysm. No adenopathy.  Reproductive: Prostate gland 4.3 x 3.3 x 3.1 cm (volume = 23 cm^3). Seminal vesicles unremarkable.  Other: Small umbilical hernia containing fat. No free air or free fluid. No acute inflammatory process.  Musculoskeletal: Degenerative disc disease changes L5-S1. No acute osseous findings.  IMPRESSION: Small umbilical hernia containing fat.  Minimal prostatic enlargement.  No acute intra-abdominal or intrapelvic abnormalities.  No cause for LEFT flank pain identified.  Aortic Atherosclerosis (ICD10-I70.0).   Electronically Signed By: Lavonia Dana M.D. On:  10/31/2020 08:45   Assessment & Plan:    1. Benign prostatic hyperplasia with urinary obstruction -continue uroxatral '10mg'$  qhs - Urinalysis, Routine w reflex microscopic - BLADDER SCAN AMB NON-IMAGING  2. Nocturia Continue uroxatral '10mg'$  qhs  3. Prostate cancer screening Followup 1 year with PSA   No follow-ups on file.  Nicolette Bang, MD  [facility] [place]

## 2022-07-16 LAB — URINALYSIS, ROUTINE W REFLEX MICROSCOPIC
Bilirubin, UA: NEGATIVE
Glucose, UA: NEGATIVE
Ketones, UA: NEGATIVE
Leukocytes,UA: NEGATIVE
Nitrite, UA: NEGATIVE
Protein,UA: NEGATIVE
Specific Gravity, UA: 1.01 (ref 1.005–1.030)
Urobilinogen, Ur: 0.2 mg/dL (ref 0.2–1.0)
pH, UA: 6 (ref 5.0–7.5)

## 2022-07-16 LAB — MICROSCOPIC EXAMINATION: Bacteria, UA: NONE SEEN

## 2022-07-21 ENCOUNTER — Other Ambulatory Visit: Payer: Self-pay | Admitting: Nurse Practitioner

## 2022-07-30 ENCOUNTER — Other Ambulatory Visit (HOSPITAL_COMMUNITY): Payer: Self-pay | Admitting: Nurse Practitioner

## 2022-08-07 ENCOUNTER — Encounter (HOSPITAL_COMMUNITY): Payer: Self-pay | Admitting: *Deleted

## 2022-08-15 ENCOUNTER — Other Ambulatory Visit: Payer: Self-pay | Admitting: Nurse Practitioner

## 2022-08-15 ENCOUNTER — Other Ambulatory Visit: Payer: Self-pay | Admitting: Internal Medicine

## 2022-08-15 DIAGNOSIS — N529 Male erectile dysfunction, unspecified: Secondary | ICD-10-CM

## 2022-09-03 ENCOUNTER — Ambulatory Visit (INDEPENDENT_AMBULATORY_CARE_PROVIDER_SITE_OTHER): Payer: 59 | Admitting: Family Medicine

## 2022-09-03 ENCOUNTER — Encounter: Payer: Self-pay | Admitting: Family Medicine

## 2022-09-03 ENCOUNTER — Telehealth (HOSPITAL_COMMUNITY): Payer: Self-pay | Admitting: *Deleted

## 2022-09-03 VITALS — BP 128/81 | HR 65 | Ht 74.0 in | Wt 203.0 lb

## 2022-09-03 DIAGNOSIS — E559 Vitamin D deficiency, unspecified: Secondary | ICD-10-CM

## 2022-09-03 DIAGNOSIS — R7301 Impaired fasting glucose: Secondary | ICD-10-CM

## 2022-09-03 DIAGNOSIS — F32A Depression, unspecified: Secondary | ICD-10-CM

## 2022-09-03 DIAGNOSIS — E0789 Other specified disorders of thyroid: Secondary | ICD-10-CM | POA: Diagnosis not present

## 2022-09-03 DIAGNOSIS — F419 Anxiety disorder, unspecified: Secondary | ICD-10-CM | POA: Diagnosis not present

## 2022-09-03 DIAGNOSIS — I10 Essential (primary) hypertension: Secondary | ICD-10-CM | POA: Diagnosis not present

## 2022-09-03 DIAGNOSIS — E7849 Other hyperlipidemia: Secondary | ICD-10-CM | POA: Diagnosis not present

## 2022-09-03 DIAGNOSIS — E7800 Pure hypercholesterolemia, unspecified: Secondary | ICD-10-CM

## 2022-09-03 MED ORDER — DILTIAZEM HCL ER COATED BEADS 120 MG PO CP24: 120.0000 mg | ORAL_CAPSULE | Freq: Every day | ORAL | 11 refills | Status: DC

## 2022-09-03 NOTE — Assessment & Plan Note (Signed)
Controlled He takes diltiazem 180 mg daily for A-fib Denies headaches, dizziness, chest pain, palpitation, shortness of breath Encouraged lifestyle modification with increased physical activity and a heart healthy diet BP Readings from Last 3 Encounters:  09/03/22 128/81  07/15/22 130/86  07/07/22 (!) 146/87

## 2022-09-03 NOTE — Progress Notes (Signed)
Established Patient Office Visit  Subjective:  Patient ID: William Kim, male    DOB: May 25, 1966  Age: 57 y.o. MRN: [id number]  CC:  Chief Complaint  Patient presents with   Follow-up    6 month f/u for htn/hld.     HPI William Kim is a 57 y.o. male with past medical history of hyperlipidemia, hypertension, and anxiety presents for f/u of  chronic medical conditions. For the details of today's visit, please refer to the assessment and plan.     Past Medical History:  Diagnosis Date   A-fib ([place])    Anemia    Phreesia 12/26/2019   Anxiety    Atrial fibrillation ([place])    Cervicalgia    Encounter for screening colonoscopy 07/02/2018   High cholesterol    Hypertension    Obesity (BMI 30-39.9) 05/31/2018   OSA (obstructive sleep apnea) 05/31/2018   Severe OSA with AHI 79/h with significant oxygen desaturations as low as 58%.   Sleep apnea     Past Surgical History:  Procedure Laterality Date   BIOPSY  05/15/2022   Procedure: BIOPSY;  Surgeon: Daneil Dolin, MD;  [facility];  Service: Endoscopy;;   COLONOSCOPY WITH PROPOFOL N/A 08/02/2018   normal   ESOPHAGOGASTRODUODENOSCOPY (EGD) WITH PROPOFOL N/A 05/15/2022   Procedure: ESOPHAGOGASTRODUODENOSCOPY (EGD) WITH PROPOFOL;  Surgeon: Daneil Dolin, MD;  [facility];  Service: Endoscopy;  Laterality: N/A;  11:45 am   MALONEY DILATION N/A 05/15/2022   Procedure: Venia Minks DILATION;  Surgeon: Daneil Dolin, MD;  [facility];  Service: Endoscopy;  Laterality: N/A;   NECK SURGERY     NERVE SURGERY     None      Family History  Problem Relation Age of Onset   Hypertension Mother    Healthy Father    Colon cancer Neg Hx    Colon polyps Neg Hx    Liver disease Neg Hx     Social History   Socioeconomic History   Marital status: Widowed    Spouse name: Archie Patten    Number of children: 2   Years of education: Not on file   Highest education level: 12th grade  Occupational  History   Occupation: welder  Tobacco Use   Smoking status: Former    Types: Cigarettes   Smokeless tobacco: Never   Tobacco comments:    quit smoking in 2012/2013, quit smoking August 16th  Vaping Use   Vaping Use: Never used  Substance and Sexual Activity   Alcohol use: Not Currently    Comment: stopped 5 years ago   Drug use: No   Sexual activity: Yes  Other Topics Concern   Not on file  Social History Narrative      Wear seat belt    Smoke detectors   Does not use phone while driving    Social Determinants of Health   Financial Resource Strain: Low Risk  (05/31/2019)   Overall Financial Resource Strain (CARDIA)    Difficulty of Paying Living Expenses: Not hard at all  Food Insecurity: No Food Insecurity (05/31/2019)   Hunger Vital Sign    Worried About Running Out of Food in the Last Year: Never true    [place] in the Last Year: Never true  Transportation Needs: No Transportation Needs (05/31/2019)   PRAPARE - Hydrologist (Medical): No    Lack of Transportation (Non-Medical): No  Physical Activity:  Insufficiently Active (05/31/2019)   Exercise Vital Sign    Days of Exercise per Week: 5 days    Minutes of Exercise per Session: 20 min  Stress: Stress Concern Present (05/31/2019)   [place]    Feeling of Stress : To some extent  Social Connections: Moderately Isolated (05/31/2019)   Social Connection and Isolation Panel [NHANES]    Frequency of Communication with Friends and Family: Three times a week    Frequency of Social Gatherings with Friends and Family: Three times a week    Attends Religious Services: Never    Active Member of Clubs or Organizations: No    Attends Archivist Meetings: Never    Marital Status: Married  Human resources officer Violence: Not At Risk (05/31/2019)   Humiliation, Afraid, Rape, and Kick questionnaire    Fear of Current or  Ex-Partner: No    Emotionally Abused: No    Physically Abused: No    Sexually Abused: No    Outpatient Medications Prior to Visit  Medication Sig Dispense Refill   alfuzosin (UROXATRAL) 10 MG 24 hr tablet Take 1 tablet (10 mg total) by mouth at bedtime. 90 tablet 3   Ascorbic Acid (VITAMIN C PO) Take 1 tablet by mouth daily.     diltiazem (CARDIZEM CD) 180 MG 24 hr capsule TAKE 1 CAPSULE EVERY MORNING 90 capsule 3   hydrOXYzine (VISTARIL) 50 MG capsule TAKE ONE CAPSULE BY MOUTH TWICE A DAY AS NEEDED FOR ANXIETY AND PANIC ATTACKS     sildenafil (VIAGRA) 100 MG tablet TAKE 1 TABLET (100 MG TOTAL) BY MOUTH DAILY AS NEEDED. 30 tablet 2   simvastatin (ZOCOR) 10 MG tablet Take 10 mg by mouth at bedtime.     valACYclovir (VALTREX) 500 MG tablet TAKE 1 TABLET (500 MG TOTAL) BY MOUTH DAILY. 30 tablet 2   RABEprazole (ACIPHEX) 20 MG tablet Take 1 tablet (20 mg total) by mouth daily. 30 tablet 11   No facility-administered medications prior to visit.    No Known Allergies  ROS Review of Systems  Constitutional:  Negative for fatigue and fever.  Eyes:  Negative for visual disturbance.  Respiratory:  Negative for chest tightness and shortness of breath.   Cardiovascular:  Negative for chest pain and palpitations.  Neurological:  Negative for dizziness and headaches.      Objective:    Physical Exam HENT:     Head: Normocephalic.     Right Ear: External ear normal.     Left Ear: External ear normal.     Nose: No congestion or rhinorrhea.     Mouth/Throat:     Mouth: Mucous membranes are moist.  Cardiovascular:     Rate and Rhythm: Regular rhythm.     Heart sounds: No murmur heard. Pulmonary:     Effort: No respiratory distress.     Breath sounds: Normal breath sounds.  Neurological:     Mental Status: He is alert.     BP 128/81   Pulse 65   Ht '6\' 2"'$  (1.88 m)   Wt 203 lb (92.1 kg)   SpO2 97%   BMI 26.06 kg/m  Wt Readings from Last 3 Encounters:  09/03/22 203 lb (92.1 kg)   07/07/22 194 lb 6.4 oz (88.2 kg)  06/20/22 198 lb 3.2 oz (89.9 kg)    Lab Results  Component Value Date   TSH 2.100 03/10/2022   Lab Results  Component Value Date   WBC  4.0 03/18/2022   HGB 12.8 (L) 03/18/2022   HCT 36.8 (L) 03/18/2022   MCV 90.0 03/18/2022   PLT 186 03/18/2022   Lab Results  Component Value Date   NA 143 03/18/2022   K 3.9 03/18/2022   CO2 24 03/18/2022   GLUCOSE 109 (H) 03/18/2022   BUN 17 03/18/2022   CREATININE 1.01 03/18/2022   BILITOT 0.6 07/09/2022   ALKPHOS 77 03/18/2022   AST 39 (H) 07/09/2022   ALT 45 07/09/2022   PROT 6.8 07/09/2022   ALBUMIN 4.3 03/18/2022   CALCIUM 9.1 03/18/2022   ANIONGAP 8 03/18/2022   EGFR 99 03/10/2022   Lab Results  Component Value Date   CHOL 177 03/10/2022   Lab Results  Component Value Date   HDL 32 (L) 03/10/2022   Lab Results  Component Value Date   LDLCALC 130 (H) 03/10/2022   Lab Results  Component Value Date   TRIG 82 03/10/2022   Lab Results  Component Value Date   CHOLHDL 5.5 (H) 03/10/2022   Lab Results  Component Value Date   HGBA1C 5.5 03/10/2022      Assessment & Plan:  Benign essential HTN Assessment & Plan: Controlled He takes diltiazem 180 mg daily for A-fib Denies headaches, dizziness, chest pain, palpitation, shortness of breath Encouraged lifestyle modification with increased physical activity and a heart healthy diet BP Readings from Last 3 Encounters:  09/03/22 128/81  07/15/22 130/86  07/07/22 (!) 146/87     Orders: -     CMP14+EGFR -     CBC with Differential/Platelet  Anxiety and depression Assessment & Plan: Denies suicidal thoughts and ideation Reports not taking hydroxyzine 50 mg as needed for anxiety Reports doing well without therapy   Other hyperlipidemia Assessment & Plan: He takes simvastatin 10 mg daily Denies abdominal pain nausea/vomiting, muscle aches and pain Will assess his lipid panel today Lab Results  Component Value Date   CHOL  177 03/10/2022   HDL 32 (L) 03/10/2022   LDLCALC 130 (H) 03/10/2022   TRIG 82 03/10/2022   CHOLHDL 5.5 (H) 03/10/2022     Orders: -     Lipid panel  Other specified disorders of thyroid -     TSH + free T4  Hypercholesteremia  Vitamin D deficiency -     VITAMIN D 25 Hydroxy (Vit-D Deficiency, Fractures)  Impaired fasting blood sugar -     Hemoglobin A1c    Follow-up: Return in about 1 month (around 10/04/2022) for CPE.   Alvira Monday, FNP

## 2022-09-03 NOTE — Patient Instructions (Signed)
I appreciate the opportunity to provide care to you today!    Follow up:  1 month for CPE  Labs: please stop by the lab during the week to get your blood drawn (CBC, CMP, TSH, Lipid profile, HgA1c, Vit D)   Please continue to a heart-healthy diet and increase your physical activities. Try to exercise for 27mns at least five times a week.  Physical activity helps: Lower your blood glucose, improve your heart health, lower your blood pressure and cholesterol, burn calories to help manage her weight, gave you energy, lower stress, and improve his sleep.  The American diabetes Association (ADA) recommends being active for 2-1/2 hours (150 minutes) or more week.  Exercise for 30 minutes, 5 days a week (150 minutes total)     It was a pleasure to see you and I look forward to continuing to work together on your health and well-being. Please do not hesitate to call the office if you need care or have questions about your care.   Have a wonderful day and week. With Gratitude, GAlvira MondayMSN, FNP-BC

## 2022-09-03 NOTE — Telephone Encounter (Signed)
Pt called in stating he has increased diet/exercise and felt he was overmedicated. For the last month he has decreased his cardizem to '120mg'$  daily and has had no afib/palpitations. Wanting permission to continue at current dose. Per Adline Peals PA will continue cardizem '120mg'$  daily. Call if issues.

## 2022-09-03 NOTE — Assessment & Plan Note (Signed)
He takes simvastatin 10 mg daily Denies abdominal pain nausea/vomiting, muscle aches and pain Will assess his lipid panel today Lab Results  Component Value Date   CHOL 177 03/10/2022   HDL 32 (L) 03/10/2022   LDLCALC 130 (H) 03/10/2022   TRIG 82 03/10/2022   CHOLHDL 5.5 (H) 03/10/2022

## 2022-09-03 NOTE — Assessment & Plan Note (Signed)
Denies suicidal thoughts and ideation Reports not taking hydroxyzine 50 mg as needed for anxiety Reports doing well without therapy

## 2022-09-16 LAB — CBC WITH DIFFERENTIAL/PLATELET
Basophils Absolute: 0 10*3/uL (ref 0.0–0.2)
Basos: 0 %
EOS (ABSOLUTE): 0.1 10*3/uL (ref 0.0–0.4)
Eos: 1 %
Hematocrit: 44.1 % (ref 37.5–51.0)
Hemoglobin: 14.5 g/dL (ref 13.0–17.7)
Immature Grans (Abs): 0 10*3/uL (ref 0.0–0.1)
Immature Granulocytes: 0 %
Lymphocytes Absolute: 1.8 10*3/uL (ref 0.7–3.1)
Lymphs: 34 %
MCH: 30.1 pg (ref 26.6–33.0)
MCHC: 32.9 g/dL (ref 31.5–35.7)
MCV: 92 fL (ref 79–97)
Monocytes Absolute: 0.5 10*3/uL (ref 0.1–0.9)
Monocytes: 9 %
Neutrophils Absolute: 3 10*3/uL (ref 1.4–7.0)
Neutrophils: 56 %
Platelets: 181 10*3/uL (ref 150–450)
RBC: 4.81 x10E6/uL (ref 4.14–5.80)
RDW: 15.1 % (ref 11.6–15.4)
WBC: 5.3 10*3/uL (ref 3.4–10.8)

## 2022-09-16 LAB — CMP14+EGFR
ALT: 47 IU/L — ABNORMAL HIGH (ref 0–44)
AST: 33 IU/L (ref 0–40)
Albumin/Globulin Ratio: 2 (ref 1.2–2.2)
Albumin: 4.4 g/dL (ref 3.8–4.9)
Alkaline Phosphatase: 92 IU/L (ref 44–121)
BUN/Creatinine Ratio: 16 (ref 9–20)
BUN: 14 mg/dL (ref 6–24)
Bilirubin Total: 0.8 mg/dL (ref 0.0–1.2)
CO2: 23 mmol/L (ref 20–29)
Calcium: 9.5 mg/dL (ref 8.7–10.2)
Chloride: 105 mmol/L (ref 96–106)
Creatinine, Ser: 0.9 mg/dL (ref 0.76–1.27)
Globulin, Total: 2.2 g/dL (ref 1.5–4.5)
Glucose: 94 mg/dL (ref 70–99)
Potassium: 4 mmol/L (ref 3.5–5.2)
Sodium: 143 mmol/L (ref 134–144)
Total Protein: 6.6 g/dL (ref 6.0–8.5)
eGFR: 101 mL/min/{1.73_m2} (ref >59)

## 2022-09-16 LAB — LIPID PANEL
Chol/HDL Ratio: 3.5 ratio (ref 0.0–5.0)
Cholesterol, Total: 148 mg/dL (ref 100–199)
HDL: 42 mg/dL (ref >39)
LDL Chol Calc (NIH): 89 mg/dL (ref 0–99)
Triglycerides: 88 mg/dL (ref 0–149)
VLDL Cholesterol Cal: 17 mg/dL (ref 5–40)

## 2022-09-16 LAB — HEMOGLOBIN A1C
Est. average glucose Bld gHb Est-mCnc: 114 mg/dL
Hgb A1c MFr Bld: 5.6 % (ref 4.8–5.6)

## 2022-09-16 LAB — TSH+FREE T4
Free T4: 1.4 ng/dL (ref 0.82–1.77)
TSH: 1.9 u[IU]/mL (ref 0.450–4.500)

## 2022-09-16 LAB — VITAMIN D 25 HYDROXY (VIT D DEFICIENCY, FRACTURES): Vit D, 25-Hydroxy: 40.3 ng/mL (ref 30.0–100.0)

## 2022-09-16 NOTE — Progress Notes (Signed)
Your labs are stable

## 2022-10-08 ENCOUNTER — Encounter: Payer: Self-pay | Admitting: Family Medicine

## 2022-10-08 ENCOUNTER — Ambulatory Visit (INDEPENDENT_AMBULATORY_CARE_PROVIDER_SITE_OTHER): Payer: 59 | Admitting: Family Medicine

## 2022-10-08 VITALS — BP 129/80 | HR 68 | Wt 202.1 lb

## 2022-10-08 DIAGNOSIS — H6121 Impacted cerumen, right ear: Secondary | ICD-10-CM | POA: Insufficient documentation

## 2022-10-08 DIAGNOSIS — Z0001 Encounter for general adult medical examination with abnormal findings: Secondary | ICD-10-CM | POA: Diagnosis not present

## 2022-10-08 DIAGNOSIS — Z125 Encounter for screening for malignant neoplasm of prostate: Secondary | ICD-10-CM | POA: Diagnosis not present

## 2022-10-08 NOTE — Assessment & Plan Note (Signed)
Would like to be screened for prostate cancer Reports FMH(uncle) of prostate cancer with no current symptoms reported PSA ordered

## 2022-10-08 NOTE — Assessment & Plan Note (Signed)
Right ear irrigation performed  

## 2022-10-08 NOTE — Patient Instructions (Addendum)
I appreciate the opportunity to provide care to you today!    Follow up:  4 months and 1 year for CPE  Labs: please stop by the lab today to get your blood drawn (PSA)  Physical activity helps: Lower your blood glucose, improve your heart health, lower your blood pressure and cholesterol, burn calories to help manage her weight, gave you energy, lower stress, and improve his sleep.  The American diabetes Association (ADA) recommends being active for 2-1/2 hours (150 minutes) or more week.  Exercise for 30 minutes, 5 days a week (150 minutes total)   Encouraged to Exercise: If you are able: 30 -60 minutes a day ,4 days a week, or 150 minutes a week. The longer the better. Combine stretch, strength, and aerobic activities Encourage to eat whole Food, Plant Predominant Nutrition is highly recommended: Eat Plenty of vegetables, Mushrooms, fruits, Legumes, Whole Grains, Nuts, seeds in lieu of processed meats, processed snacks/pastries red meat, poultry, eggs.      Please continue to a heart-healthy diet and increase your physical activities. Try to exercise for at least five days a week.      It was a pleasure to see you and I look forward to continuing to work together on your health and well-being. Please do not hesitate to call the office if you need care or have questions about your care.   Have a wonderful day and week. With Gratitude, Gilmore Laroche MSN, FNP-BC

## 2022-10-08 NOTE — Progress Notes (Signed)
Complete physical exam  Patient: William Kim   DOB: 1966-05-09   57 y.o. Male  MRN: [id number]  Subjective:    Chief Complaint  Patient presents with   Annual Exam    Cpe today, needs form completed for work. Would like to have his labs reviewed.     William Kim is a 57 y.o. male who presents today for a complete physical exam. He reports consuming a general diet.  He reports exercising 3-4 days a week for 30 mins  He generally feels well. He reports sleeping well. He does not have additional problems to discuss today.    Most recent fall risk assessment:    10/08/2022    4:02 PM  Fall Risk   Falls in the past year? 0  Number falls in past yr: 0  Injury with Fall? 0  Risk for fall due to : No Fall Risks  Follow up Falls evaluation completed     Most recent depression screenings:    10/08/2022    4:02 PM 09/03/2022    4:04 PM  PHQ 2/9 Scores  PHQ - 2 Score 0 0  PHQ- 9 Score 0 0    Dental: No current dental problems and Last dental visit: 06/2022  Patient Active Problem List   Diagnosis Date Noted   Prostate cancer screening 10/08/2022   Impacted cerumen, right ear 10/08/2022   Encounter for annual general medical examination with abnormal findings in adult 03/05/2022   Sciatica, left side 03/05/2022   Impaired vision 03/05/2022   Abnormal transaminases 12/05/2021   Hyperlipidemia 08/23/2021   Right sided temporal headache 05/14/2021   Neck pain on right side 05/14/2021   Nonintractable headache 05/14/2021   Right temporal headache 02/14/2021   Right shoulder pain 02/14/2021   Encounter for general adult medical examination with abnormal findings 02/14/2021   Screening due 02/14/2021   OAB (overactive bladder) 12/25/2020   Benign prostatic hyperplasia with urinary obstruction 08/24/2020   Nocturia 07/05/2020   Skin mole 07/05/2020   Annual visit for general adult medical examination with abnormal findings 12/29/2019   Encounter for screening for  malignant neoplasm of prostate 12/29/2019   Encounter for hepatitis C screening test for low risk patient 12/29/2019   Atrial fibrillation 05/31/2019   Anxiety and depression 05/31/2019   Sleep disturbance 05/31/2019   OSA (obstructive sleep apnea) 05/31/2018   Benign essential HTN 05/31/2018   Past Medical History:  Diagnosis Date   A-fib    Anemia    Phreesia 12/26/2019   Anxiety    Atrial fibrillation    Cervicalgia    Encounter for screening colonoscopy 07/02/2018   High cholesterol    Hypertension    Obesity (BMI 30-39.9) 05/31/2018   OSA (obstructive sleep apnea) 05/31/2018   Severe OSA with AHI 79/h with significant oxygen desaturations as low as 58%.   Sleep apnea    Past Surgical History:  Procedure Laterality Date   BIOPSY  05/15/2022   Procedure: BIOPSY;  Surgeon: Corbin Ade, MD;  [facility];  Service: Endoscopy;;   COLONOSCOPY WITH PROPOFOL N/A 08/02/2018   normal   ESOPHAGOGASTRODUODENOSCOPY (EGD) WITH PROPOFOL N/A 05/15/2022   Procedure: ESOPHAGOGASTRODUODENOSCOPY (EGD) WITH PROPOFOL;  Surgeon: Corbin Ade, MD;  [facility]: [facility];  Service: Endoscopy;  Laterality: N/A;  11:45 am   MALONEY DILATION N/A 05/15/2022   Procedure: Elease Hashimoto DILATION;  Surgeon: Corbin Ade, MD;  Location: [facility];  Service: Endoscopy;  Laterality:  N/A;   NECK SURGERY     NERVE SURGERY     None     Social History   Tobacco Use   Smoking status: Former    Types: Cigarettes   Smokeless tobacco: Never   Tobacco comments:    quit smoking in 2012/2013, quit smoking August 16th  Vaping Use   Vaping Use: Never used  Substance Use Topics   Alcohol use: Not Currently    Comment: stopped 5 years ago   Drug use: No   Social History   Socioeconomic History   Marital status: Widowed    Spouse name: Enid Derry    Number of children: 2   Years of education: Not on file   Highest education level: 12th grade  Occupational History   Occupation: welder   Tobacco Use   Smoking status: Former    Types: Cigarettes   Smokeless tobacco: Never   Tobacco comments:    quit smoking in 2012/2013, quit smoking August 16th  Vaping Use   Vaping Use: Never used  Substance and Sexual Activity   Alcohol use: Not Currently    Comment: stopped 5 years ago   Drug use: No   Sexual activity: Yes  Other Topics Concern   Not on file  Social History Narrative      Wear seat belt    Smoke detectors   Does not use phone while driving    Social Determinants of Health   Financial Resource Strain: Low Risk  (05/31/2019)   Overall Financial Resource Strain (CARDIA)    Difficulty of Paying Living Expenses: Not hard at all  Food Insecurity: No Food Insecurity (05/31/2019)   Hunger Vital Sign    Worried About Running Out of Food in the Last Year: Never true    Ran Out of Food in the Last Year: Never true  Transportation Needs: No Transportation Needs (05/31/2019)   PRAPARE - Administrator, Civil Service (Medical): No    Lack of Transportation (Non-Medical): No  Physical Activity: Insufficiently Active (05/31/2019)   Exercise Vital Sign    Days of Exercise per Week: 5 days    Minutes of Exercise per Session: 20 min  Stress: Stress Concern Present (05/31/2019)   [employer] of Occupational Health - Occupational Stress Questionnaire    Feeling of Stress : To some extent  Social Connections: Moderately Isolated (05/31/2019)   Social Connection and Isolation Panel [NHANES]    Frequency of Communication with Friends and Family: Three times a week    Frequency of Social Gatherings with Friends and Family: Three times a week    Attends Religious Services: Never    Active Member of Clubs or Organizations: No    Attends Banker Meetings: Never    Marital Status: Married  Catering manager Violence: Not At Risk (05/31/2019)   Humiliation, Afraid, Rape, and Kick questionnaire    Fear of Current or Ex-Partner: No    Emotionally Abused:  No    Physically Abused: No    Sexually Abused: No   Family Status  Relation Name Status   Mother  Alive   Father  Deceased   MGM  Deceased   MGF  Deceased   PGM  Deceased   PGF  Deceased   Neg Hx  (Not Specified)   Family History  Problem Relation Age of Onset   Hypertension Mother    Healthy Father    Colon cancer Neg Hx    Colon polyps Neg Hx  Liver disease Neg Hx    No Known Allergies    Patient Care Team: Gilmore LarocheZarwolo, Gyanna Jarema, FNP as PCP - General (Family Medicine) Quintella Reicherturner, Traci R, MD as PCP - Sleep Medicine (Sleep Medicine) Corbin Adeourk, Robert M, MD as Consulting Physician (Gastroenterology)   Outpatient Medications Prior to Visit  Medication Sig   alfuzosin (UROXATRAL) 10 MG 24 hr tablet Take 1 tablet (10 mg total) by mouth at bedtime.   Ascorbic Acid (VITAMIN C PO) Take 1 tablet by mouth daily.   diltiazem (CARDIZEM CD) 120 MG 24 hr capsule Take 1 capsule (120 mg total) by mouth daily.   hydrOXYzine (VISTARIL) 50 MG capsule TAKE ONE CAPSULE BY MOUTH TWICE A DAY AS NEEDED FOR ANXIETY AND PANIC ATTACKS   sildenafil (VIAGRA) 100 MG tablet TAKE 1 TABLET (100 MG TOTAL) BY MOUTH DAILY AS NEEDED.   simvastatin (ZOCOR) 10 MG tablet Take 10 mg by mouth at bedtime.   valACYclovir (VALTREX) 500 MG tablet TAKE 1 TABLET (500 MG TOTAL) BY MOUTH DAILY.   No facility-administered medications prior to visit.    Review of Systems  Constitutional:  Negative for chills, fever and malaise/fatigue.  HENT:  Negative for congestion and sinus pain.   Eyes:  Negative for pain, discharge and redness.  Respiratory:  Negative for cough, sputum production and shortness of breath.   Cardiovascular:  Negative for chest pain, palpitations, claudication and leg swelling.  Gastrointestinal:  Negative for diarrhea, heartburn and nausea.  Genitourinary:  Negative for flank pain and frequency.  Musculoskeletal:  Negative for back pain and joint pain.  Skin:  Negative for itching.  Neurological:   Negative for dizziness, seizures and headaches.  Endo/Heme/Allergies:  Negative for environmental allergies.  Psychiatric/Behavioral:  Negative for memory loss. The patient does not have insomnia.        Objective:    BP 129/80   Pulse 68   Wt 202 lb 1.3 oz (91.7 kg)   SpO2 95%   BMI 25.95 kg/m  BP Readings from Last 3 Encounters:  10/08/22 129/80  09/03/22 128/81  07/15/22 130/86   Wt Readings from Last 3 Encounters:  10/08/22 202 lb 1.3 oz (91.7 kg)  09/03/22 203 lb (92.1 kg)  07/07/22 194 lb 6.4 oz (88.2 kg)      Physical Exam HENT:     Head: Normocephalic.     Right Ear: External ear normal. There is impacted cerumen.     Left Ear: External ear normal.     Nose: No congestion.     Mouth/Throat:     Mouth: Mucous membranes are moist.  Eyes:     Extraocular Movements: Extraocular movements intact.     Pupils: Pupils are equal, round, and reactive to light.  Cardiovascular:     Rate and Rhythm: Regular rhythm. Bradycardia present.     Heart sounds: No murmur heard. Pulmonary:     Effort: No respiratory distress.     Breath sounds: Normal breath sounds.  Abdominal:     Tenderness: There is no right CVA tenderness or left CVA tenderness.  Musculoskeletal:     Right lower leg: No edema.     Left lower leg: No edema.  Neurological:     Mental Status: He is alert and oriented to person, place, and time.     GCS: GCS eye subscore is 4. GCS verbal subscore is 5. GCS motor subscore is 6.     Cranial Nerves: No facial asymmetry.     Motor: No atrophy.  Coordination: Coordination normal. Finger-Nose-Finger Test normal.     Gait: Gait normal.  Psychiatric:        Judgment: Judgment normal.     No results found for any visits on 10/08/22. Last CBC Lab Results  Component Value Date   WBC 5.3 09/15/2022   HGB 14.5 09/15/2022   HCT 44.1 09/15/2022   MCV 92 09/15/2022   MCH 30.1 09/15/2022   RDW 15.1 09/15/2022   PLT 181 09/15/2022   Last metabolic  panel Lab Results  Component Value Date   GLUCOSE 94 09/15/2022   NA 143 09/15/2022   K 4.0 09/15/2022   CL 105 09/15/2022   CO2 23 09/15/2022   BUN 14 09/15/2022   CREATININE 0.90 09/15/2022   EGFR 101 09/15/2022   CALCIUM 9.5 09/15/2022   PROT 6.6 09/15/2022   ALBUMIN 4.4 09/15/2022   LABGLOB 2.2 09/15/2022   AGRATIO 2.0 09/15/2022   BILITOT 0.8 09/15/2022   ALKPHOS 92 09/15/2022   AST 33 09/15/2022   ALT 47 (H) 09/15/2022   ANIONGAP 8 03/18/2022   Last lipids Lab Results  Component Value Date   CHOL 148 09/15/2022   HDL 42 09/15/2022   LDLCALC 89 09/15/2022   TRIG 88 09/15/2022   CHOLHDL 3.5 09/15/2022   Last hemoglobin A1c Lab Results  Component Value Date   HGBA1C 5.6 09/15/2022   Last thyroid functions Lab Results  Component Value Date   TSH 1.900 09/15/2022   Last vitamin D Lab Results  Component Value Date   VD25OH 40.3 09/15/2022   Last vitamin B12 and Folate No results found for: "VITAMINB12", "FOLATE"      Assessment & Plan:    Routine Health Maintenance and Physical Exam  Immunization History  Administered Date(s) Administered   Influenza,inj,Quad PF,6+ Mos 04/03/2019   Influenza-Unspecified 03/31/2019, 02/29/2020   PFIZER(Purple Top)SARS-COV-2 Vaccination 09/06/2019, 09/27/2019, 05/06/2020, 05/06/2020, 11/04/2020   Td 12/13/2021   Zoster Recombinat (Shingrix) 12/12/2019, 03/06/2020    Health Maintenance  Topic Date Due   COVID-19 Vaccine (7 - 2023-24 season) 02/28/2022   INFLUENZA VACCINE  01/29/2023   COLONOSCOPY (Pts 45-29yrs Insurance coverage will need to be confirmed)  08/02/2028   DTaP/Tdap/Td (2 - Tdap) 12/14/2031   Hepatitis C Screening  Completed   HIV Screening  Completed   Zoster Vaccines- Shingrix  Completed   HPV VACCINES  Aged Out    Discussed health benefits of physical activity, and encouraged him to engage in regular exercise appropriate for his age and condition.  Encounter for annual general medical  examination with abnormal findings in adult Assessment & Plan: Physical exam as documented Discussed heart-healthy diet  Encouraged to Exercise: If you are able: 30 -60 minutes a day ,4 days a week, or 150 minutes a week. The longer the better. Combine stretch, strength, and aerobic activities Encourage to eat whole Food, Plant Predominant Nutrition is highly recommended: Eat Plenty of vegetables, Mushrooms, fruits, Legumes, Whole Grains, Nuts, seeds in lieu of processed meats, processed snacks/pastries red meat, poultry, eggs.  Will f/u in 1 year for CPE      Prostate cancer screening Assessment & Plan: Would like to be screened for prostate cancer Reports FMH(uncle) of prostate cancer with no current symptoms reported PSA ordered  Orders: -     PSA, total and free  Impacted cerumen, right ear Assessment & Plan: Right ear irrigation performed    Note: This chart has been completed using Engineer, civil (consulting) software, and while attempts have been made  to ensure accuracy, certain words and phrases may not be transcribed as intended.    No follow-ups on file.     Gilmore Laroche, FNP

## 2022-10-08 NOTE — Assessment & Plan Note (Signed)
Physical exam as documented Discussed heart-healthy diet  Encouraged to Exercise: If you are able: 30 -60 minutes a day ,4 days a week, or 150 minutes a week. The longer the better. Combine stretch, strength, and aerobic activities Encourage to eat whole Food, Plant Predominant Nutrition is highly recommended: Eat Plenty of vegetables, Mushrooms, fruits, Legumes, Whole Grains, Nuts, seeds in lieu of processed meats, processed snacks/pastries red meat, poultry, eggs.  Will f/u in 1 year for CPE    

## 2022-10-09 ENCOUNTER — Other Ambulatory Visit: Payer: Self-pay | Admitting: Family Medicine

## 2022-10-09 LAB — PSA, TOTAL AND FREE
PSA, Free Pct: 13.8 %
PSA, Free: 0.18 ng/mL
Prostate Specific Ag, Serum: 1.3 ng/mL (ref 0.0–4.0)

## 2022-11-03 ENCOUNTER — Other Ambulatory Visit (HOSPITAL_COMMUNITY): Payer: Self-pay | Admitting: *Deleted

## 2022-11-03 MED ORDER — DILTIAZEM HCL ER COATED BEADS 120 MG PO CP24: 120.0000 mg | ORAL_CAPSULE | Freq: Every day | ORAL | 2 refills | Status: DC

## 2023-02-09 ENCOUNTER — Ambulatory Visit (INDEPENDENT_AMBULATORY_CARE_PROVIDER_SITE_OTHER): Payer: Managed Care, Other (non HMO) | Admitting: Family Medicine

## 2023-02-09 ENCOUNTER — Encounter: Payer: Self-pay | Admitting: Family Medicine

## 2023-02-09 VITALS — BP 132/84 | HR 89 | Ht 74.0 in | Wt 205.1 lb

## 2023-02-09 DIAGNOSIS — E559 Vitamin D deficiency, unspecified: Secondary | ICD-10-CM

## 2023-02-09 DIAGNOSIS — E038 Other specified hypothyroidism: Secondary | ICD-10-CM

## 2023-02-09 DIAGNOSIS — I10 Essential (primary) hypertension: Secondary | ICD-10-CM

## 2023-02-09 DIAGNOSIS — Z125 Encounter for screening for malignant neoplasm of prostate: Secondary | ICD-10-CM

## 2023-02-09 DIAGNOSIS — E7849 Other hyperlipidemia: Secondary | ICD-10-CM | POA: Diagnosis not present

## 2023-02-09 DIAGNOSIS — R7301 Impaired fasting glucose: Secondary | ICD-10-CM

## 2023-02-09 NOTE — Patient Instructions (Signed)
 I appreciate the opportunity to provide care to you today!    Follow up:  4 months  Labs: please stop by the lab during the week  to get your blood drawn (CBC, CMP, TSH, Lipid profile, HgA1c, Vit D)   Attached with your AVS, you will find valuable resources for self-education. I highly recommend dedicating some time to thoroughly examine them.   Please continue to a heart-healthy diet and increase your physical activities. Try to exercise for at least five days a week.    It was a pleasure to see you and I look forward to continuing to work together on your health and well-being. Please do not hesitate to call the office if you need care or have questions about your care.  In case of emergency, please visit the Emergency Department for urgent care, or contact our clinic at [phone] to schedule an appointment. We're here to help you!   Have a wonderful day and week. With Gratitude, Gilmore Laroche MSN, FNP-BC

## 2023-02-09 NOTE — Progress Notes (Unsigned)
Established Patient Office Visit  Subjective:  Patient ID: William Kim, male    DOB: Aug 19, 1965  Age: 57 y.o. MRN: [id number]  CC:  Chief Complaint  Patient presents with   Care Management    4 month f/u    HPI William Kim is a 57 y.o. male with past medical history of benign essential hypertension, hyperlipidemia and erectile dysfunction presents for f/u of  chronic medical conditions. For the details of today's visit, please refer to the assessment and plan.  Past Medical History:  Diagnosis Date   A-fib (HCC)    Anemia    Phreesia 12/26/2019   Anxiety    Atrial fibrillation (HCC)    Cervicalgia    Encounter for screening colonoscopy 07/02/2018   High cholesterol    Hypertension    Obesity (BMI 30-39.9) 05/31/2018   OSA (obstructive sleep apnea) 05/31/2018   Severe OSA with AHI 79/h with significant oxygen desaturations as low as 58%.   Sleep apnea     Past Surgical History:  Procedure Laterality Date   BIOPSY  05/15/2022   Procedure: BIOPSY;  Surgeon: Corbin Ade, MD;  [facility];  Service: Endoscopy;;   COLONOSCOPY WITH PROPOFOL N/A 08/02/2018   normal   ESOPHAGOGASTRODUODENOSCOPY (EGD) WITH PROPOFOL N/A 05/15/2022   Procedure: ESOPHAGOGASTRODUODENOSCOPY (EGD) WITH PROPOFOL;  Surgeon: Corbin Ade, MD;  [facility];  Service: Endoscopy;  Laterality: N/A;  11:45 am   MALONEY DILATION N/A 05/15/2022   Procedure: Elease Hashimoto DILATION;  Surgeon: Corbin Ade, MD;  [facility];  Service: Endoscopy;  Laterality: N/A;   NECK SURGERY     NERVE SURGERY     None      Family History  Problem Relation Age of Onset   Hypertension Mother    Healthy Father    Colon cancer Neg Hx    Colon polyps Neg Hx    Liver disease Neg Hx     Social History   Socioeconomic History   Marital status: Widowed    Spouse name: Enid Derry    Number of children: 2   Years of education: Not on file   Highest education level: 12th grade   Occupational History   Occupation: welder  Tobacco Use   Smoking status: Former    Types: Cigarettes   Smokeless tobacco: Never   Tobacco comments:    quit smoking in 2012/2013, quit smoking August 16th  Vaping Use   Vaping status: Never Used  Substance and Sexual Activity   Alcohol use: Not Currently    Comment: stopped 5 years ago   Drug use: No   Sexual activity: Yes  Other Topics Concern   Not on file  Social History Narrative      Wear seat belt    Smoke detectors   Does not use phone while driving    Social Determinants of Health   Financial Resource Strain: Low Risk  (05/31/2019)   Overall Financial Resource Strain (CARDIA)    Difficulty of Paying Living Expenses: Not hard at all  Food Insecurity: No Food Insecurity (05/31/2019)   Hunger Vital Sign    Worried About Running Out of Food in the Last Year: Never true    Ran Out of Food in the Last Year: Never true  Transportation Needs: No Transportation Needs (05/31/2019)   PRAPARE - Administrator, Civil Service (Medical): No    Lack of Transportation (Non-Medical): No  Physical Activity: Insufficiently Active (  05/31/2019)   Exercise Vital Sign    Days of Exercise per Week: 5 days    Minutes of Exercise per Session: 20 min  Stress: Stress Concern Present (05/31/2019)   [employer] of Occupational Health - Occupational Stress Questionnaire    Feeling of Stress : To some extent  Social Connections: Unknown (11/12/2021)   Received from [facility], [facility]   Social Network    Social Network: Not on file  Intimate Partner Violence: Unknown (10/04/2021)   Received from [facility], [facility]   HITS    Physically Hurt: Not on file    Insult or Talk Down To: Not on file    Threaten Physical Harm: Not on file    Scream or Curse: Not on file    Outpatient Medications Prior to Visit  Medication Sig Dispense Refill   alfuzosin (UROXATRAL) 10 MG 24 hr tablet Take 1 tablet (10 mg total) by  mouth at bedtime. 90 tablet 3   Ascorbic Acid (VITAMIN C PO) Take 1 tablet by mouth daily.     diltiazem (CARDIZEM CD) 120 MG 24 hr capsule Take 1 capsule (120 mg total) by mouth daily. 90 capsule 2   hydrOXYzine (VISTARIL) 50 MG capsule TAKE ONE CAPSULE BY MOUTH TWICE A DAY AS NEEDED FOR ANXIETY AND PANIC ATTACKS     sildenafil (VIAGRA) 100 MG tablet TAKE 1 TABLET (100 MG TOTAL) BY MOUTH DAILY AS NEEDED. 30 tablet 2   simvastatin (ZOCOR) 10 MG tablet Take 10 mg by mouth at bedtime.     valACYclovir (VALTREX) 500 MG tablet TAKE 1 TABLET (500 MG TOTAL) BY MOUTH DAILY. 30 tablet 2   No facility-administered medications prior to visit.    No Known Allergies  ROS Review of Systems  Constitutional:  Negative for fatigue and fever.  Eyes:  Negative for visual disturbance.  Respiratory:  Negative for chest tightness and shortness of breath.   Cardiovascular:  Negative for chest pain and palpitations.  Neurological:  Negative for dizziness and headaches.      Objective:    Physical Exam HENT:     Head: Normocephalic.     Right Ear: External ear normal.     Left Ear: External ear normal.     Nose: No congestion or rhinorrhea.     Mouth/Throat:     Mouth: Mucous membranes are moist.  Cardiovascular:     Rate and Rhythm: Regular rhythm.     Heart sounds: No murmur heard. Pulmonary:     Effort: No respiratory distress.     Breath sounds: Normal breath sounds.  Neurological:     Mental Status: He is alert.     BP 132/84 (BP Location: Left Arm)   Pulse 89   Ht 6\' 2"  (1.88 m)   Wt 205 lb 1.9 oz (93 kg)   SpO2 97%   BMI 26.34 kg/m  Wt Readings from Last 3 Encounters:  02/09/23 205 lb 1.9 oz (93 kg)  10/08/22 202 lb 1.3 oz (91.7 kg)  09/03/22 203 lb (92.1 kg)    Lab Results  Component Value Date   TSH 1.900 09/15/2022   Lab Results  Component Value Date   WBC 5.3 09/15/2022   HGB 14.5 09/15/2022   HCT 44.1 09/15/2022   MCV 92 09/15/2022   PLT 181 09/15/2022   Lab  Results  Component Value Date   NA 143 09/15/2022   K 4.0 09/15/2022   CO2 23 09/15/2022   GLUCOSE 94 09/15/2022  BUN 14 09/15/2022   CREATININE 0.90 09/15/2022   BILITOT 0.8 09/15/2022   ALKPHOS 92 09/15/2022   AST 33 09/15/2022   ALT 47 (H) 09/15/2022   PROT 6.6 09/15/2022   ALBUMIN 4.4 09/15/2022   CALCIUM 9.5 09/15/2022   ANIONGAP 8 03/18/2022   EGFR 101 09/15/2022   Lab Results  Component Value Date   CHOL 148 09/15/2022   Lab Results  Component Value Date   HDL 42 09/15/2022   Lab Results  Component Value Date   LDLCALC 89 09/15/2022   Lab Results  Component Value Date   TRIG 88 09/15/2022   Lab Results  Component Value Date   CHOLHDL 3.5 09/15/2022   Lab Results  Component Value Date   HGBA1C 5.6 09/15/2022      Assessment & Plan:  Benign essential HTN Assessment & Plan: Controlled He takes diltiazem 180 mg daily for A-fib Denies headaches, dizziness, chest pain, palpitation, shortness of breath Encouraged lifestyle modification with increased physical activity and a heart healthy diet BP Readings from Last 3 Encounters:  02/09/23 132/84  10/08/22 129/80  09/03/22 128/81      Other hyperlipidemia Assessment & Plan: He takes simvastatin 10 mg daily Denies abdominal pain nausea/vomiting, muscle aches and pain Encouraged heart healthy diet with increased physical activity Pending lipid panel today Lab Results  Component Value Date   CHOL 148 09/15/2022   HDL 42 09/15/2022   LDLCALC 89 09/15/2022   TRIG 88 09/15/2022   CHOLHDL 3.5 09/15/2022     Orders: -     Lipid panel -     CMP14+EGFR -     CBC with Differential/Platelet  IFG (impaired fasting glucose) -     Hemoglobin A1c  Vitamin D deficiency -     VITAMIN D 25 Hydroxy (Vit-D Deficiency, Fractures)  Other specified hypothyroidism -     TSH + free T4  Prostate cancer screening -     PSA   Note: This chart has been completed using Engineer, civil (consulting) software, and  while attempts have been made to ensure accuracy, certain words and phrases may not be transcribed as intended.   Follow-up: Return in about 4 months (around 06/11/2023).   Gilmore Laroche, FNP

## 2023-02-10 NOTE — Assessment & Plan Note (Signed)
He takes simvastatin 10 mg daily Denies abdominal pain nausea/vomiting, muscle aches and pain Encouraged heart healthy diet with increased physical activity Pending lipid panel today Lab Results  Component Value Date   CHOL 148 09/15/2022   HDL 42 09/15/2022   LDLCALC 89 09/15/2022   TRIG 88 09/15/2022   CHOLHDL 3.5 09/15/2022

## 2023-02-10 NOTE — Assessment & Plan Note (Signed)
Controlled He takes diltiazem 180 mg daily for A-fib Denies headaches, dizziness, chest pain, palpitation, shortness of breath Encouraged lifestyle modification with increased physical activity and a heart healthy diet BP Readings from Last 3 Encounters:  02/09/23 132/84  10/08/22 129/80  09/03/22 128/81

## 2023-02-22 ENCOUNTER — Other Ambulatory Visit: Payer: Self-pay | Admitting: Family Medicine

## 2023-02-22 DIAGNOSIS — E785 Hyperlipidemia, unspecified: Secondary | ICD-10-CM

## 2023-02-22 NOTE — Progress Notes (Signed)
The 10-year ASCVD risk score (Arnett DK, et al., 2019) is: 11.9%   Values used to calculate the score:     Age: 57 years     Sex: Male     Is Non-Hispanic African American: Yes     Diabetic: No     Tobacco smoker: No     Systolic Blood Pressure: 132 mmHg     Is BP treated: Yes     HDL Cholesterol: 49 mg/dL     Total Cholesterol: 217 mg/dL

## 2023-02-23 ENCOUNTER — Other Ambulatory Visit: Payer: Self-pay

## 2023-02-23 ENCOUNTER — Encounter: Payer: Self-pay | Admitting: Family Medicine

## 2023-02-23 MED ORDER — SIMVASTATIN 10 MG PO TABS
10.0000 mg | ORAL_TABLET | Freq: Every day | ORAL | 0 refills | Status: DC
  Filled 2023-02-23: qty 30, 30d supply, fill #0

## 2023-02-24 ENCOUNTER — Other Ambulatory Visit: Payer: Self-pay

## 2023-02-24 MED ORDER — SIMVASTATIN 10 MG PO TABS: 10.0000 mg | ORAL_TABLET | Freq: Every day | ORAL | 0 refills | Status: DC

## 2023-03-22 ENCOUNTER — Other Ambulatory Visit: Payer: Self-pay | Admitting: Family Medicine

## 2023-03-23 ENCOUNTER — Other Ambulatory Visit (HOSPITAL_BASED_OUTPATIENT_CLINIC_OR_DEPARTMENT_OTHER): Payer: Self-pay

## 2023-03-23 MED ORDER — SIMVASTATIN 10 MG PO TABS: 10.0000 mg | ORAL_TABLET | Freq: Every day | ORAL | 0 refills | Status: DC

## 2023-05-18 ENCOUNTER — Other Ambulatory Visit: Payer: Self-pay | Admitting: Nurse Practitioner

## 2023-05-18 DIAGNOSIS — N529 Male erectile dysfunction, unspecified: Secondary | ICD-10-CM

## 2023-06-12 ENCOUNTER — Ambulatory Visit: Payer: 59 | Admitting: Family Medicine

## 2023-06-16 ENCOUNTER — Other Ambulatory Visit: Payer: Self-pay | Admitting: Family Medicine

## 2023-06-19 ENCOUNTER — Ambulatory Visit (HOSPITAL_COMMUNITY)
Admission: RE | Admit: 2023-06-19 | Discharge: 2023-06-19 | Disposition: A | Discharge status: Home / Self Care | Payer: 59 | Source: Ambulatory Visit | Attending: Internal Medicine | Admitting: Internal Medicine

## 2023-06-19 VITALS — BP 140/84 | HR 87 | Ht 74.0 in | Wt 212.6 lb

## 2023-06-19 DIAGNOSIS — I480 Paroxysmal atrial fibrillation: Secondary | ICD-10-CM

## 2023-06-19 DIAGNOSIS — I10 Essential (primary) hypertension: Secondary | ICD-10-CM | POA: Insufficient documentation

## 2023-06-19 DIAGNOSIS — G4733 Obstructive sleep apnea (adult) (pediatric): Secondary | ICD-10-CM | POA: Insufficient documentation

## 2023-06-19 DIAGNOSIS — I4891 Unspecified atrial fibrillation: Secondary | ICD-10-CM | POA: Diagnosis not present

## 2023-06-19 MED ORDER — DILTIAZEM HCL 30 MG PO TABS: 30.0000 mg | ORAL_TABLET | Freq: Two times a day (BID) | ORAL | 11 refills | Status: DC
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
## 2023-06-19 NOTE — Patient Instructions (Signed)
Kardia mobile 

## 2023-06-22 ENCOUNTER — Ambulatory Visit (HOSPITAL_COMMUNITY): Payer: 59 | Admitting: Internal Medicine

## 2023-07-02 ENCOUNTER — Ambulatory Visit (INDEPENDENT_AMBULATORY_CARE_PROVIDER_SITE_OTHER): Payer: 59 | Admitting: Family Medicine

## 2023-07-02 ENCOUNTER — Encounter: Payer: Self-pay | Admitting: Family Medicine

## 2023-07-02 VITALS — BP 134/83 | HR 81 | Resp 16 | Ht 72.0 in | Wt 212.0 lb

## 2023-07-02 DIAGNOSIS — I10 Essential (primary) hypertension: Secondary | ICD-10-CM | POA: Diagnosis not present

## 2023-07-02 DIAGNOSIS — E559 Vitamin D deficiency, unspecified: Secondary | ICD-10-CM

## 2023-07-02 DIAGNOSIS — R7301 Impaired fasting glucose: Secondary | ICD-10-CM

## 2023-07-02 DIAGNOSIS — E7849 Other hyperlipidemia: Secondary | ICD-10-CM | POA: Diagnosis not present

## 2023-07-02 DIAGNOSIS — I4891 Unspecified atrial fibrillation: Secondary | ICD-10-CM | POA: Diagnosis not present

## 2023-07-02 DIAGNOSIS — E038 Other specified hypothyroidism: Secondary | ICD-10-CM

## 2023-07-02 NOTE — Assessment & Plan Note (Signed)
 The patient is under the care of cardiology and is currently taking diltiazem 30 mg twice daily. They report doing well and are not experiencing any symptoms at this time in the clinic.

## 2023-07-02 NOTE — Patient Instructions (Signed)
I appreciate the opportunity to provide care to you today!    Follow up:  4 months  Labs: please stop by the lab today to get your blood drawn (CBC, CMP, TSH, Lipid profile, HgA1c, Vit D)   Here are some foods to avoid or reduce in your diet to help manage cholesterol levels:  Fried Foods:Deep-fried items such as french fries, fried chicken, and fried snacks are high in unhealthy fats and can raise LDL (bad) cholesterol levels. Processed Meats:Foods like bacon, sausage, hot dogs, and deli meats are often high in saturated fat and cholesterol. Full-Fat Dairy Products:Whole milk, full-fat yogurt, butter, cream, and cheese are rich in saturated fats, which can increase cholesterol levels. Baked Goods and Sweets:Pastries, cakes, cookies, and donuts often contain trans fats and added sugars, which can raise LDL cholesterol and lower HDL (good) cholesterol. Red Meat:Beef, lamb, and pork are high in saturated fat. Lean cuts or plant-based protein alternatives are better options. Lard and Shortening:Used in some baked goods, lard and shortening are high in trans fats and should be avoided. Fast Food:Many fast food items are cooked with unhealthy oils and contain high amounts of saturated and trans fats. Processed Snacks:Chips, crackers, and certain microwave popcorns can contain trans fats and high levels of unhealthy oils. Shellfish:While nutritious in other ways, some shellfish like shrimp, lobster, and crab are high in cholesterol. They should be consumed in moderation. Coconut and Palm Oils:these oils are high in saturated fat and can raise cholesterol levels when used in cooking or baking.       Please continue to a heart-healthy diet and increase your physical activities. Try to exercise for at least five days a week.    It was a pleasure to see you and I look forward to continuing to work together on your health and well-being. Please do not hesitate to call the office if you need  care or have questions about your care.  In case of emergency, please visit the Emergency Department for urgent care, or contact our clinic at [phone] to schedule an appointment. We're here to help you!   Have a wonderful day and week. With Gratitude, Gilmore Laroche MSN, FNP-BC

## 2023-07-02 NOTE — Assessment & Plan Note (Signed)
 He takes simvastatin  10 mg daily Denies abdominal pain nausea/vomiting, muscle aches and pain Encouraged heart healthy diet with increased physical activity  Lab Results  Component Value Date   CHOL 217 (H) 02/20/2023   HDL 49 02/20/2023   LDLCALC 156 (H) 02/20/2023   TRIG 69 02/20/2023   CHOLHDL 4.4 02/20/2023

## 2023-07-02 NOTE — Assessment & Plan Note (Signed)
 Controlled He takes diltiazem  30 mg twice daily for A-fib Denies headaches, dizziness, chest pain, palpitation, shortness of breath Encouraged lifestyle modification with increased physical activity and a heart healthy diet BP Readings from Last 3 Encounters:  07/02/23 134/83  06/19/23 (!) 140/84  02/09/23 132/84

## 2023-07-02 NOTE — Progress Notes (Signed)
 Established Patient Office Visit  Subjective:  Patient ID: William Kim, male    DOB: [birth year]  Age: 58 y.o. MRN: [id number]  CC:  Chief Complaint  Patient presents with   Hypertension    4 month follow up     HPI William Kim is a 58 y.o. male with past medical history of hypertension, hyperlipidemia, presents for f/u of  chronic medical conditions. For the details of today's visit, please refer to the assessment and plan.     Past Medical History:  Diagnosis Date   A-fib (HCC)    Anemia    Phreesia 12/26/2019   Anxiety    Atrial fibrillation (HCC)    Cervicalgia    Encounter for screening colonoscopy 07/02/2018   High cholesterol    Hypertension    Obesity (BMI 30-39.9) 05/31/2018   OSA (obstructive sleep apnea) 05/31/2018   Severe OSA with AHI 79/h with significant oxygen desaturations as low as 58%.   Sleep apnea     Past Surgical History:  Procedure Laterality Date   BIOPSY  05/15/2022   Procedure: BIOPSY;  Surgeon: Shaaron Lamar HERO, MD;  [facility];  Service: Endoscopy;;   COLONOSCOPY WITH PROPOFOL  N/A 08/02/2018   normal   ESOPHAGOGASTRODUODENOSCOPY (EGD) WITH PROPOFOL  N/A 05/15/2022   Procedure: ESOPHAGOGASTRODUODENOSCOPY (EGD) WITH PROPOFOL ;  Surgeon: Shaaron Lamar HERO, MD;  [facility];  Service: Endoscopy;  Laterality: N/A;  11:45 am   MALONEY DILATION N/A 05/15/2022   Procedure: AGAPITO DILATION;  Surgeon: Shaaron Lamar HERO, MD;  [facility];  Service: Endoscopy;  Laterality: N/A;   NECK SURGERY     NERVE SURGERY     None      Family History  Problem Relation Age of Onset   Hypertension Mother    Healthy Father    Colon cancer Neg Hx    Colon polyps Neg Hx    Liver disease Neg Hx     Social History   Socioeconomic History   Marital status: Widowed    Spouse name: Terry    Number of children: 2   Years of education: Not on file   Highest education level: Associate degree: occupational, scientist, product/process development, or  vocational program  Occupational History   Occupation: welder  Tobacco Use   Smoking status: Former    Types: Cigarettes   Smokeless tobacco: Never   Tobacco comments:    quit smoking in 2012/2013, quit smoking August 16th  Vaping Use   Vaping status: Never Used  Substance and Sexual Activity   Alcohol use: Not Currently    Comment: stopped 5 years ago   Drug use: No   Sexual activity: Yes  Other Topics Concern   Not on file  Social History Narrative      Wear seat belt    Smoke detectors   Does not use phone while driving    Social Drivers of Health   Financial Resource Strain: Low Risk  (07/01/2023)   Overall Financial Resource Strain (CARDIA)    Difficulty of Paying Living Expenses: Not hard at all  Food Insecurity: No Food Insecurity (07/01/2023)   Hunger Vital Sign    Worried About Running Out of Food in the Last Year: Never true    Ran Out of Food in the Last Year: Never true  Transportation Needs: No Transportation Needs (07/01/2023)   PRAPARE - Administrator, Civil Service (Medical): No    Lack of Transportation (Non-Medical): No  Physical Activity: Insufficiently Active (07/01/2023)   Exercise Vital Sign    Days of Exercise per Week: 3 days    Minutes of Exercise per Session: 40 min  Stress: No Stress Concern Present (07/01/2023)   [employer] of Occupational Health - Occupational Stress Questionnaire    Feeling of Stress : Only a little  Social Connections: Moderately Isolated (07/01/2023)   Social Connection and Isolation Panel [NHANES]    Frequency of Communication with Friends and Family: More than three times a week    Frequency of Social Gatherings with Friends and Family: Once a week    Attends Religious Services: 1 to 4 times per year    Active Member of [employer] or Organizations: No    Attends Banker Meetings: Not on file    Marital Status: Widowed  Intimate Partner Violence: Unknown (10/04/2021)   [facility]   HITS    Physically Hurt: Not on file    Insult or Talk Down To: Not on file    Threaten Physical Harm: Not on file    Scream or Curse: Not on file    Outpatient Medications Prior to Visit  Medication Sig Dispense Refill   alfuzosin  (UROXATRAL ) 10 MG 24 hr tablet Take 1 tablet (10 mg total) by mouth at bedtime. 90 tablet 3   Ascorbic Acid (VITAMIN C PO) Take 1 tablet by mouth daily.     diltiazem  (CARDIZEM ) 30 MG tablet Take 1 tablet (30 mg total) by mouth 2 (two) times daily. 60 tablet 11   sildenafil  (VIAGRA ) 100 MG tablet TAKE 1 TABLET (100 MG TOTAL) BY MOUTH DAILY AS NEEDED. 30 tablet 2   simvastatin  (ZOCOR ) 10 MG tablet Take 1 tablet (10 mg total) by mouth at bedtime. 90 tablet 0   valACYclovir  (VALTREX ) 500 MG tablet TAKE 1 TABLET DAILY 90 tablet 3   hydrOXYzine  (VISTARIL ) 50 MG capsule TAKE ONE CAPSULE BY MOUTH TWICE A DAY AS NEEDED FOR ANXIETY AND PANIC ATTACKS (Patient not taking: Reported on 07/02/2023)     No facility-administered medications prior to visit.    No Known Allergies  ROS Review of Systems  Constitutional:  Negative for fatigue and fever.  Eyes:  Negative for visual disturbance.  Respiratory:  Negative for chest tightness and shortness of breath.   Cardiovascular:  Negative for chest pain and palpitations.  Neurological:  Negative for dizziness and headaches.      Objective:    Physical Exam HENT:     Head: Normocephalic.     Right Ear: External ear normal.     Left Ear: External ear normal.     Nose: No congestion or rhinorrhea.     Mouth/Throat:     Mouth: Mucous membranes are moist.  Cardiovascular:     Rate and Rhythm: Regular rhythm.     Heart sounds: No murmur heard. Pulmonary:     Effort: No respiratory distress.     Breath sounds: Normal breath sounds.  Neurological:     Mental Status: He is alert.     BP 134/83   Pulse 81   Resp 16   Ht 6' (1.829 m)   Wt 212 lb (96.2 kg)   SpO2 96%   BMI 28.75 kg/m  Wt  Readings from Last 3 Encounters:  07/02/23 212 lb (96.2 kg)  06/19/23 212 lb 9.6 oz (96.4 kg)  02/09/23 205 lb 1.9 oz (93 kg)    Lab Results  Component Value Date  TSH 0.700 02/20/2023   Lab Results  Component Value Date   WBC 4.7 02/20/2023   HGB 14.0 02/20/2023   HCT 43.2 02/20/2023   MCV 92 02/20/2023   PLT 174 02/20/2023   Lab Results  Component Value Date   NA 142 02/20/2023   K 4.2 02/20/2023   CO2 20 02/20/2023   GLUCOSE 81 02/20/2023   BUN 11 02/20/2023   CREATININE 0.95 02/20/2023   BILITOT 0.9 02/20/2023   ALKPHOS 94 02/20/2023   AST 66 (H) 02/20/2023   ALT 58 (H) 02/20/2023   PROT 6.8 02/20/2023   ALBUMIN 4.5 02/20/2023   CALCIUM 9.0 02/20/2023   ANIONGAP 8 03/18/2022   EGFR 95 02/20/2023   Lab Results  Component Value Date   CHOL 217 (H) 02/20/2023   Lab Results  Component Value Date   HDL 49 02/20/2023   Lab Results  Component Value Date   LDLCALC 156 (H) 02/20/2023   Lab Results  Component Value Date   TRIG 69 02/20/2023   Lab Results  Component Value Date   CHOLHDL 4.4 02/20/2023   Lab Results  Component Value Date   HGBA1C 5.6 02/20/2023      Assessment & Plan:  Benign essential HTN Assessment & Plan: Controlled He takes diltiazem  30 mg twice daily for A-fib Denies headaches, dizziness, chest pain, palpitation, shortness of breath Encouraged lifestyle modification with increased physical activity and a heart healthy diet BP Readings from Last 3 Encounters:  07/02/23 134/83  06/19/23 (!) 140/84  02/09/23 132/84      Atrial fibrillation, unspecified type [facility]) Assessment & Plan: The patient is under the care of cardiology and is currently taking diltiazem  30 mg twice daily. They report doing well and are not experiencing any symptoms at this time in the clinic.    Other hyperlipidemia Assessment & Plan: He takes simvastatin  10 mg daily Denies abdominal pain nausea/vomiting, muscle aches and pain Encouraged heart  healthy diet with increased physical activity  Lab Results  Component Value Date   CHOL 217 (H) 02/20/2023   HDL 49 02/20/2023   LDLCALC 156 (H) 02/20/2023   TRIG 69 02/20/2023   CHOLHDL 4.4 02/20/2023     Orders: -     Lipid panel -     CMP14+EGFR -     CBC with Differential/Platelet  IFG (impaired fasting glucose) -     Hemoglobin A1c  Vitamin D  deficiency disease -     VITAMIN D  25 Hydroxy (Vit-D Deficiency, Fractures)  TSH (thyroid -stimulating hormone deficiency) -     TSH + free T4  Note: This chart has been completed using Engineer, Civil (consulting) software, and while attempts have been made to ensure accuracy, certain words and phrases may not be transcribed as intended.    Follow-up: Return in about 4 months (around 10/30/2023).   Arman Loy, FNP

## 2023-07-03 ENCOUNTER — Encounter: Payer: Self-pay | Admitting: Family Medicine

## 2023-07-03 LAB — TSH+FREE T4
Free T4: 1.55 ng/dL (ref 0.82–1.77)
TSH: 1.75 u[IU]/mL (ref 0.450–4.500)

## 2023-07-03 LAB — CBC WITH DIFFERENTIAL/PLATELET
Basophils Absolute: 0 10*3/uL (ref 0.0–0.2)
Basos: 0 %
EOS (ABSOLUTE): 0.1 10*3/uL (ref 0.0–0.4)
Eos: 1 %
Hematocrit: 44.1 % (ref 37.5–51.0)
Hemoglobin: 14.1 g/dL (ref 13.0–17.7)
Immature Grans (Abs): 0 10*3/uL (ref 0.0–0.1)
Immature Granulocytes: 0 %
Lymphocytes Absolute: 1.4 10*3/uL (ref 0.7–3.1)
Lymphs: 31 %
MCH: 30.5 pg (ref 26.6–33.0)
MCHC: 32 g/dL (ref 31.5–35.7)
MCV: 96 fL (ref 79–97)
Monocytes Absolute: 0.5 10*3/uL (ref 0.1–0.9)
Monocytes: 10 %
Neutrophils Absolute: 2.7 10*3/uL (ref 1.4–7.0)
Neutrophils: 58 %
Platelets: 190 10*3/uL (ref 150–450)
RBC: 4.62 x10E6/uL (ref 4.14–5.80)
RDW: 14.7 % (ref 11.6–15.4)
WBC: 4.7 10*3/uL (ref 3.4–10.8)

## 2023-07-03 LAB — CMP14+EGFR
ALT: 40 [IU]/L (ref 0–44)
AST: 37 [IU]/L (ref 0–40)
Albumin: 4.4 g/dL (ref 3.8–4.9)
Alkaline Phosphatase: 106 [IU]/L (ref 44–121)
BUN/Creatinine Ratio: 12 (ref 9–20)
BUN: 13 mg/dL (ref 6–24)
Bilirubin Total: 1 mg/dL (ref 0.0–1.2)
CO2: 24 mmol/L (ref 20–29)
Calcium: 9.3 mg/dL (ref 8.7–10.2)
Chloride: 104 mmol/L (ref 96–106)
Creatinine, Ser: 1.09 mg/dL (ref 0.76–1.27)
Globulin, Total: 2.5 g/dL (ref 1.5–4.5)
Glucose: 90 mg/dL (ref 70–99)
Potassium: 4.2 mmol/L (ref 3.5–5.2)
Sodium: 141 mmol/L (ref 134–144)
Total Protein: 6.9 g/dL (ref 6.0–8.5)
eGFR: 80 mL/min/{1.73_m2} (ref >59)

## 2023-07-03 LAB — HEMOGLOBIN A1C
Est. average glucose Bld gHb Est-mCnc: 111 mg/dL
Hgb A1c MFr Bld: 5.5 % (ref 4.8–5.6)

## 2023-07-03 LAB — LIPID PANEL
Chol/HDL Ratio: 3.9 {ratio} (ref 0.0–5.0)
Cholesterol, Total: 166 mg/dL (ref 100–199)
HDL: 43 mg/dL (ref >39)
LDL Chol Calc (NIH): 110 mg/dL — ABNORMAL HIGH (ref 0–99)
Triglycerides: 66 mg/dL (ref 0–149)
VLDL Cholesterol Cal: 13 mg/dL (ref 5–40)

## 2023-07-03 LAB — VITAMIN D 25 HYDROXY (VIT D DEFICIENCY, FRACTURES): Vit D, 25-Hydroxy: 67.7 ng/mL (ref 30.0–100.0)

## 2023-07-13 ENCOUNTER — Ambulatory Visit: Payer: 59 | Admitting: Urology

## 2023-07-13 ENCOUNTER — Encounter: Payer: Self-pay | Admitting: Urology

## 2023-07-13 VITALS — BP 153/86 | HR 80

## 2023-07-13 DIAGNOSIS — Z125 Encounter for screening for malignant neoplasm of prostate: Secondary | ICD-10-CM

## 2023-07-13 DIAGNOSIS — R351 Nocturia: Secondary | ICD-10-CM | POA: Diagnosis not present

## 2023-07-13 DIAGNOSIS — N401 Enlarged prostate with lower urinary tract symptoms: Secondary | ICD-10-CM | POA: Diagnosis not present

## 2023-07-13 DIAGNOSIS — N138 Other obstructive and reflux uropathy: Secondary | ICD-10-CM

## 2023-07-13 MED ORDER — ALFUZOSIN HCL ER 10 MG PO TB24: 10.0000 mg | ORAL_TABLET | Freq: Every evening | ORAL | 3 refills | Status: AC

## 2023-07-13 NOTE — Progress Notes (Signed)
 07/13/2023 4:10 PM   William Kim 29-Mar-1966 [id number]  Referring provider: Paseda, Folashade R, FNP [phone] [address]. [address], [address] [place],  [place] [address]  Followup BPH and prostate cancer screening   HPI: [name] is a 57yo here for followup for BPh and for prostate cancer screening. PSA was normal per patient when it was drawn in Dec 2024 at the [facility]. IPSS 8 QOL 2 on uroxatral  10mg  at bedtime. Nocturia 2x. He drinks over 20oz of water  within 2 hours of going to bed.    PMH: Past Medical History:  Diagnosis Date   A-fib (HCC)    Anemia    Phreesia 12/26/2019   Anxiety    Atrial fibrillation (HCC)    Cervicalgia    Encounter for screening colonoscopy 07/02/2018   High cholesterol    Hypertension    Obesity (BMI 30-39.9) 05/31/2018   OSA (obstructive sleep apnea) 05/31/2018   Severe OSA with AHI 79/h with significant oxygen desaturations as low as 58%.   Sleep apnea     Surgical History: Past Surgical History:  Procedure Laterality Date   BIOPSY  05/15/2022   Procedure: BIOPSY;  Surgeon: William Lamar HERO, MD;  [facility];  Service: Endoscopy;;   COLONOSCOPY WITH PROPOFOL  N/A 08/02/2018   normal   ESOPHAGOGASTRODUODENOSCOPY (EGD) WITH PROPOFOL  N/A 05/15/2022   Procedure: ESOPHAGOGASTRODUODENOSCOPY (EGD) WITH PROPOFOL ;  Surgeon: William Lamar HERO, MD;  [facility];  Service: Endoscopy;  Laterality: N/A;  11:45 am   MALONEY DILATION N/A 05/15/2022   Procedure: William Kim DILATION;  Surgeon: William Lamar HERO, MD;  [facility];  Service: Endoscopy;  Laterality: N/A;   NECK SURGERY     NERVE SURGERY     None      Home Medications:  Allergies as of 07/13/2023   No Known Allergies      Medication List        Accurate as of July 13, 2023  4:10 PM. If you have any questions, ask your nurse or doctor.          alfuzosin  10 MG 24 hr tablet Commonly known as: UROXATRAL  Take 1 tablet (10 mg total) by mouth at bedtime.    diltiazem  30 MG tablet Commonly known as: Cardizem  Take 1 tablet (30 mg total) by mouth 2 (two) times daily.   hydrOXYzine  50 MG capsule Commonly known as: VISTARIL    sildenafil  100 MG tablet Commonly known as: VIAGRA  TAKE 1 TABLET (100 MG TOTAL) BY MOUTH DAILY AS NEEDED.   simvastatin  10 MG tablet Commonly known as: ZOCOR  Take 1 tablet (10 mg total) by mouth at bedtime.   valACYclovir  500 MG tablet Commonly known as: VALTREX  TAKE 1 TABLET DAILY   VITAMIN C PO Take 1 tablet by mouth daily.        Allergies: No Known Allergies  Family History: Family History  Problem Relation Age of Onset   Hypertension Mother    Healthy Father    Colon cancer Neg Hx    Colon polyps Neg Hx    Liver disease Neg Hx     Social History:  reports that he has quit smoking. His smoking use included cigarettes. He has never used smokeless tobacco. He reports that he does not currently use alcohol. He reports that he does not use drugs.  ROS: All other review of systems were reviewed and are negative except what is noted above in HPI  Physical Exam: BP (!) 153/86   Pulse  80   Constitutional:  Alert and oriented, No acute distress. HEENT: [place] AT, moist mucus membranes.  Trachea midline, no masses. Cardiovascular: No clubbing, cyanosis, or edema. Respiratory: Normal respiratory effort, no increased work of breathing. GI: Abdomen is soft, nontender, nondistended, no abdominal masses GU: No CVA tenderness.  Lymph: No cervical or inguinal lymphadenopathy. Skin: No rashes, bruises or suspicious lesions. Neurologic: Grossly intact, no focal deficits, moving all 4 extremities. Psychiatric: Normal mood and affect.  Laboratory Data: Lab Results  Component Value Date   WBC 4.7 07/02/2023   HGB 14.1 07/02/2023   HCT 44.1 07/02/2023   MCV 96 07/02/2023   PLT 190 07/02/2023    Lab Results  Component Value Date   CREATININE 1.09 07/02/2023    Lab Results  Component Value Date   PSA  0.7 12/29/2019    No results found for: TESTOSTERONE  Lab Results  Component Value Date   HGBA1C 5.5 07/02/2023    Urinalysis    Component Value Date/Time   COLORURINE YELLOW 10/31/2020 1134   APPEARANCEUR Clear 07/15/2022 1555   LABSPEC 1.011 10/31/2020 1134   PHURINE 9.0 (H) 10/31/2020 1134   GLUCOSEU Negative 07/15/2022 1555   HGBUR NEGATIVE 10/31/2020 1134   BILIRUBINUR Negative 07/15/2022 1555   KETONESUR NEGATIVE 10/31/2020 1134   PROTEINUR Negative 07/15/2022 1555   PROTEINUR NEGATIVE 10/31/2020 1134   UROBILINOGEN 0.2 10/30/2020 1634   NITRITE Negative 07/15/2022 1555   NITRITE NEGATIVE 10/31/2020 1134   LEUKOCYTESUR Negative 07/15/2022 1555   LEUKOCYTESUR NEGATIVE 10/31/2020 1134    Lab Results  Component Value Date   LABMICR See below: 07/15/2022   WBCUA 0-5 07/15/2022   LABEPIT 0-10 07/15/2022   BACTERIA None seen 07/15/2022    Pertinent Imaging:  No results found for this or any previous visit.  No results found for this or any previous visit.  No results found for this or any previous visit.  No results found for this or any previous visit.  No results found for this or any previous visit.  No results found for this or any previous visit.  No results found for this or any previous visit.  Results for orders placed during the hospital encounter of 10/31/20  CT Renal Stone Study  Narrative CLINICAL DATA:  LEFT flank pain since 10/27/2020, question kidney stone. No history of kidney stones. Denies hematuria.  EXAM: CT ABDOMEN AND PELVIS WITHOUT CONTRAST  TECHNIQUE: Multidetector CT imaging of the abdomen and pelvis was performed following the standard protocol without IV contrast. Sagittal and coronal MPR images reconstructed from axial data set. No oral contrast administered.  COMPARISON:  None  FINDINGS: Lower chest: Minimal RIGHT basilar atelectasis. Lung bases otherwise clear.  Hepatobiliary: Gallbladder and liver normal  appearance  Pancreas: Normal appearance  Spleen: Normal appearance  Adrenals/Urinary Tract: Adrenal glands, kidneys, ureters, and bladder normal appearance.  Stomach/Bowel: Normal appendix. Scattered stool throughout colon. Stomach decompressed. Radiopacity within the small bowel loop in the LEFT mid abdomen image 55 question medication tablet. Bowel loops otherwise unremarkable.  Vascular/Lymphatic: Atherosclerotic calcifications aorta and iliac arteries without aneurysm. No adenopathy.  Reproductive: Prostate gland 4.3 x 3.3 x 3.1 cm (volume = 23 cm^3). Seminal vesicles unremarkable.  Other: Small umbilical hernia containing fat. No free air or free fluid. No acute inflammatory process.  Musculoskeletal: Degenerative disc disease changes L5-S1. No acute osseous findings.  IMPRESSION: Small umbilical hernia containing fat.  Minimal prostatic enlargement.  No acute intra-abdominal or intrapelvic abnormalities.  No cause for LEFT flank  pain identified.  Aortic Atherosclerosis (ICD10-I70.0).   Electronically Signed By: Oneil Kiss M.D. On: 10/31/2020 08:45   Assessment & Plan:    1. Benign prostatic hyperplasia with urinary obstruction (Primary) Uroxatral  10mg  qhs - Urinalysis, Routine w reflex microscopic  2. Nocturia Uroxatral  10mg  qhs - Urinalysis, Routine w reflex microscopic  3. Prostate cancer screening Followup 1 year with PSA - Urinalysis, Routine w reflex microscopic   No follow-ups on file.  Belvie Clara, MD  [facility] 

## 2023-07-14 LAB — URINALYSIS, ROUTINE W REFLEX MICROSCOPIC
Bilirubin, UA: NEGATIVE
Glucose, UA: NEGATIVE
Ketones, UA: NEGATIVE
Leukocytes,UA: NEGATIVE
Nitrite, UA: NEGATIVE
Protein,UA: NEGATIVE
RBC, UA: NEGATIVE
Specific Gravity, UA: 1.015 (ref 1.005–1.030)
Urobilinogen, Ur: 0.2 mg/dL (ref 0.2–1.0)
pH, UA: 6 (ref 5.0–7.5)

## 2023-07-15 ENCOUNTER — Other Ambulatory Visit: Payer: Self-pay | Admitting: Family Medicine

## 2023-07-16 ENCOUNTER — Encounter: Payer: Self-pay | Admitting: Urology

## 2023-07-16 NOTE — Patient Instructions (Signed)

## 2023-07-17 ENCOUNTER — Other Ambulatory Visit: Payer: Self-pay | Admitting: Family Medicine

## 2023-10-09 ENCOUNTER — Encounter: Payer: 59 | Admitting: Family Medicine

## 2023-10-13 ENCOUNTER — Encounter: Payer: 59 | Admitting: Family Medicine

## 2023-10-25 ENCOUNTER — Other Ambulatory Visit: Payer: Self-pay | Admitting: Family Medicine

## 2023-10-29 ENCOUNTER — Telehealth (HOSPITAL_COMMUNITY): Payer: Self-pay

## 2023-10-29 MED ORDER — DILTIAZEM HCL ER COATED BEADS 120 MG PO CP24: 120.0000 mg | ORAL_CAPSULE | Freq: Every day | ORAL | Status: DC

## 2023-10-29 NOTE — Telephone Encounter (Signed)
 Patient is having symptoms of heart racing and shortness of breath in the morning. He normally takes his first dose of Diltiazem  30 mg at 3:30 am and his 2nd dose at 6:30 pm. He wanted to know if he should increase his Diltiazem  back up to 120 mg daily. Per Caesar Caster -PA he should increase his Diltiazem  back up to 120 mg daily to see if this helps with his symptoms. Patient states he has not been checking to see if he is in in rhythm. He was instructed to use his [facility] daily to see if he is going in and out of A-fib. Informed patient with increasing his Diltiazem  to 120 mg he should  monitor his blood pressures and his HR's. Patient was advised to contact our clinic back next week to give us  an update. Consulted with patient and he verbalized understanding.

## 2023-11-05 ENCOUNTER — Other Ambulatory Visit: Payer: Self-pay | Admitting: Family Medicine

## 2023-11-05 MED ORDER — VALACYCLOVIR HCL 500 MG PO TABS
500.0000 mg | ORAL_TABLET | Freq: Every day | ORAL | 3 refills | Status: DC
Start: 2023-11-05 — End: 2024-02-08

## 2023-11-05 NOTE — Telephone Encounter (Signed)
 Copied from CRM [id number]. Topic: Clinical - Medication Refill >> Nov 05, 2023  8:33 AM Alysia Jumbo S wrote: Medication: valACYclovir  (VALTREX ) 500 MG tablet  Has the patient contacted their pharmacy? Yes (Agent: If no, request that the patient contact the pharmacy for the refill. If patient does not wish to contact the pharmacy document the reason why and proceed with request.) (Agent: If yes, when and what did the pharmacy advise?)  This is the patient's preferred pharmacy:     [id number] - [place], [place] - [address] AT [address]  [place] [address] Phone: [phone] Fax: [phone]  Is this the correct pharmacy for this prescription? Yes If no, delete pharmacy and type the correct one.   Has the prescription been filled recently? No  Is the patient out of the medication? Yes  Has the patient been seen for an appointment in the last year OR does the patient have an upcoming appointment? Yes  Can we respond through MyChart? No  Agent: Please be advised that Rx refills may take up to 3 business days. We ask that you follow-up with your pharmacy.

## 2024-02-06 ENCOUNTER — Encounter: Payer: Self-pay | Admitting: Family Medicine

## 2024-02-08 ENCOUNTER — Other Ambulatory Visit: Payer: Self-pay

## 2024-02-08 MED ORDER — VALACYCLOVIR HCL 500 MG PO TABS: 500.0000 mg | ORAL_TABLET | Freq: Every day | ORAL | 2 refills | Status: AC

## 2024-05-21 ENCOUNTER — Encounter: Payer: Self-pay | Admitting: Family Medicine

## 2024-05-23 ENCOUNTER — Other Ambulatory Visit (HOSPITAL_COMMUNITY): Payer: Self-pay

## 2024-05-23 ENCOUNTER — Telehealth: Payer: Self-pay | Admitting: Family Medicine

## 2024-05-23 MED ORDER — DILTIAZEM HCL ER COATED BEADS 120 MG PO CP24: 120.0000 mg | ORAL_CAPSULE | Freq: Every day | ORAL | 3 refills | Status: AC

## 2024-05-23 NOTE — Telephone Encounter (Signed)
 noted

## 2024-05-23 NOTE — Telephone Encounter (Unsigned)
 Copied from CRM #[id number]. Topic: Clinical - Medication Refill >> May 23, 2024  8:40 AM Willma R wrote: Medication: diltiazem  (CARDIZEM  CD) 120 MG 24 hr capsule  Has the patient contacted their pharmacy? Yes, call dr  This is the patient's preferred pharmacy:  [id number] - [place], [place] - [address]  [place] [address] Phone: [phone] Fax: [phone]  Is this the correct pharmacy for this prescription? Yes If no, delete pharmacy and type the correct one.   Has the prescription been filled recently? No  Is the patient out of the medication? Yes  Has the patient been seen for an appointment in the last year OR does the patient have an upcoming appointment? Yes  Can we respond through MyChart? Yes  Agent: Please be advised that Rx refills may take up to 3 business days. We ask that you follow-up with your pharmacy.

## 2024-05-23 NOTE — Telephone Encounter (Signed)
 Request refilled by specialist today. [facility] will not process requested refill d/t duplication.

## 2024-06-06 ENCOUNTER — Encounter (HOSPITAL_COMMUNITY): Payer: Self-pay | Admitting: Internal Medicine

## 2024-06-06 ENCOUNTER — Ambulatory Visit (HOSPITAL_COMMUNITY)
Admission: RE | Admit: 2024-06-06 | Discharge: 2024-06-06 | Disposition: A | Source: Ambulatory Visit | Attending: Internal Medicine | Admitting: Internal Medicine

## 2024-06-06 VITALS — BP 126/90 | HR 75 | Ht 72.0 in | Wt 198.0 lb

## 2024-06-06 DIAGNOSIS — I480 Paroxysmal atrial fibrillation: Secondary | ICD-10-CM | POA: Diagnosis not present

## 2024-06-06 MED ORDER — APIXABAN 5 MG PO TABS: 5.0000 mg | ORAL_TABLET | Freq: Two times a day (BID) | ORAL | 3 refills | Status: DC

## 2024-06-06 NOTE — Progress Notes (Addendum)
 Primary Care Physician: Edman Meade PEDLAR, FNP Referring Physician: [facility] ER f/u   William Kim is a 58 y.o. male with a h/o HTN, OSA on BiPAP, alcohol use and untreated probable sleep apnea. It is documented in Epic in 2014 that he was treated for afib in the ER. The pt reports that he would have issues with irregular heart beat off and on since then but episodes would be short lived. He noticed the episodes were more frequent over the last few weeks and recently present to the ER, 7/31, with afib with RVR. He was given IV diltiazem  and converted.  He has a Chadsvasc score of 1.  Follow-up A-fib clinic, 06/06/2024.  Patient is currently in atrial flutter. He takes diltiazem  120 mg daily.  He notes to have felt an elevated heart rate over the past few months.  He has also noted intermittent sharp chest pains at different locations around his chest and also intermittent shortness of breath.  He thought it was attributed to extra stress from working additional overtime at work.  He admits that he has not been using his BiPAP regularly.  Today, he denies symptoms of orthopnea, PND, lower extremity edema, dizziness, presyncope, syncope, or neurologic sequela. The patient is tolerating medications without difficulties and is otherwise without complaint today.   Past Medical History:  Diagnosis Date   A-fib (HCC)    Anemia    Phreesia 12/26/2019   Anxiety    Atrial fibrillation (HCC)    Cervicalgia    Encounter for screening colonoscopy 07/02/2018   High cholesterol    Hypertension    Obesity (BMI 30-39.9) 05/31/2018   OSA (obstructive sleep apnea) 05/31/2018   Severe OSA with AHI 79/h with significant oxygen desaturations as low as 58%.   Sleep apnea    Past Surgical History:  Procedure Laterality Date   BIOPSY  05/15/2022   Procedure: BIOPSY;  Surgeon: Shaaron Lamar HERO, MD;  [facility];  Service: Endoscopy;;   COLONOSCOPY WITH PROPOFOL  N/A 08/02/2018   normal    ESOPHAGOGASTRODUODENOSCOPY (EGD) WITH PROPOFOL  N/A 05/15/2022   Procedure: ESOPHAGOGASTRODUODENOSCOPY (EGD) WITH PROPOFOL ;  Surgeon: Shaaron Lamar HERO, MD;  [facility];  Service: Endoscopy;  Laterality: N/A;  11:45 am   MALONEY DILATION N/A 05/15/2022   Procedure: AGAPITO DILATION;  Surgeon: Shaaron Lamar HERO, MD;  [facility];  Service: Endoscopy;  Laterality: N/A;   NECK SURGERY     NERVE SURGERY     None      Current Outpatient Medications  Medication Sig Dispense Refill   alfuzosin  (UROXATRAL ) 10 MG 24 hr tablet Take 1 tablet (10 mg total) by mouth at bedtime. 90 tablet 3   apixaban  (ELIQUIS ) 5 MG TABS tablet Take 1 tablet (5 mg total) by mouth 2 (two) times daily. 60 tablet 3   Ascorbic Acid (VITAMIN C PO) Take 1 tablet by mouth daily.     diltiazem  (CARDIZEM  CD) 120 MG 24 hr capsule Take 1 capsule (120 mg total) by mouth daily. 90 capsule 3   hydrOXYzine  (VISTARIL ) 50 MG capsule      sildenafil  (VIAGRA ) 100 MG tablet TAKE 1 TABLET (100 MG TOTAL) BY MOUTH DAILY AS NEEDED. 30 tablet 2   simvastatin  (ZOCOR ) 10 MG tablet TAKE 1 TABLET BY MOUTH EVERY NIGHT AT BEDTIME 30 tablet 2   valACYclovir  (VALTREX ) 500 MG tablet Take 1 tablet (500 mg total) by mouth daily. 90 tablet 2   No current facility-administered medications  for this encounter.    No Known Allergies  ROS- All systems are reviewed and negative except as per the HPI above  Physical Exam: Vitals:   06/06/24 1514  BP: (!) 126/90  Pulse: 75  Weight: 89.8 kg  Height: 6' (1.829 m)     Wt Readings from Last 3 Encounters:  06/06/24 89.8 kg  07/02/23 96.2 kg  06/19/23 96.4 kg    Labs: Lab Results  Component Value Date   NA 141 07/02/2023   K 4.2 07/02/2023   CL 104 07/02/2023   CO2 24 07/02/2023   GLUCOSE 90 07/02/2023   BUN 13 07/02/2023   CREATININE 1.09 07/02/2023   CALCIUM 9.3 07/02/2023   Lab Results  Component Value Date   INR 0.9 03/18/2022   Lab Results  Component Value Date    CHOL 166 07/02/2023   HDL 43 07/02/2023   LDLCALC 110 (H) 07/02/2023   TRIG 66 07/02/2023   GEN- The patient is well appearing, alert and oriented x 3 today.   Neck - no JVD or carotid bruit noted Lungs- Clear to ausculation bilaterally, normal work of breathing Heart- Irregular rate and rhythm, no murmurs, rubs or gallops, PMI not laterally displaced Extremities- no clubbing, cyanosis, or edema Skin - no rash or ecchymosis noted   EKG- EKG Interpretation Date/Time:  Monday June 06 2024 15:16:23 EST Ventricular Rate:  75 PR Interval:    QRS Duration:  84 QT Interval:  352 QTC Calculation: 393 R Axis:   68  Text Interpretation: Atrial flutter with variable A-V block Abnormal ECG When compared with ECG of 19-Jun-2023 09:52, Atrial flutter has replaced Sinus rhythm Confirmed by Terra Pac ([id number]) on 06/06/2024 3:44:07 PM    Epic records reviewed  Echo 07/08/22: 1. Left ventricular ejection fraction, by estimation, is 55 to 60%. The  left ventricle has normal function. The left ventricle has no regional  wall motion abnormalities. Left ventricular diastolic parameters were  normal.   2. Right ventricular systolic function is normal. The right ventricular  size is normal. There is normal pulmonary artery systolic pressure.   3. Left atrial size was severely dilated.   4. Right atrial size was mildly dilated.   5. The mitral valve is normal in structure. Trivial mitral valve  regurgitation. No evidence of mitral stenosis.   6. The aortic valve is tricuspid. Aortic valve regurgitation is not  visualized. No aortic stenosis is present.   7. Aortic dilatation noted. There is borderline dilatation of the aortic  root, measuring 36 mm.   8. The inferior vena cava is normal in size with greater than 50%  respiratory variability, suggesting right atrial pressure of 3 mmHg.    Assessment and Plan: 1. Paroxysmal afib  Patient is currently in atrial flutter.  Given his  description of ongoing symptoms, he may be persistent.  We discussed options for treatment which include cardioversion procedure.  We discussed the potential risks of cardioversion and the requirement of uninterrupted anticoagulation for at least 3 weeks prior to the procedure.  We discussed potential triggers for A-fib/flutter and patient has noted increased stress along with not using his BiPAP regularly as influencing factors.  He will start using his BiPAP regularly.  Continue diltiazem  120 mg daily.  If patient has intermittent chest pains going forward, despite being in normal rhythm, may benefit from additional cardiology workup.  We discussed anticoagulation and its potential risks versus benefit of stroke prevention.  After discussion, patient agrees to begin anticoagulation  in preparation for possible cardioversion.  Begin Eliquis  5 mg twice daily.  Will reassess patient in several weeks and determine if cardioversion is needed.   2. HTN Stable today.    Follow up in 3 weeks.   Dorn Heinrich, PA-C [facility] [address] [place], [place] [address] [phone]

## 2024-06-06 NOTE — Patient Instructions (Signed)
 Start Eliquis 5mg  twice a day

## 2024-06-14 ENCOUNTER — Telehealth: Payer: Self-pay

## 2024-06-14 DIAGNOSIS — N401 Enlarged prostate with lower urinary tract symptoms: Secondary | ICD-10-CM

## 2024-06-14 NOTE — Telephone Encounter (Signed)
 Pt called stating he is having issues getting and maintaining and erection he would like to see/ speak with MD asap pt advised a message will be sent to MD McKenzie and we will place him on the waitlist for a sooner appt

## 2024-06-24 ENCOUNTER — Ambulatory Visit (HOSPITAL_COMMUNITY)
Admission: RE | Admit: 2024-06-24 | Discharge: 2024-06-24 | Disposition: A | Discharge status: Home / Self Care | Source: Ambulatory Visit | Attending: Internal Medicine | Admitting: Internal Medicine

## 2024-06-24 VITALS — BP 138/80 | HR 105 | Ht 72.0 in | Wt 194.6 lb

## 2024-06-24 DIAGNOSIS — I4891 Unspecified atrial fibrillation: Secondary | ICD-10-CM | POA: Diagnosis not present

## 2024-06-24 DIAGNOSIS — I4892 Unspecified atrial flutter: Secondary | ICD-10-CM

## 2024-06-24 DIAGNOSIS — I4819 Other persistent atrial fibrillation: Secondary | ICD-10-CM

## 2024-06-24 NOTE — H&P (View-Only) (Signed)
 "  Primary Care Physician: Edman Meade PEDLAR, FNP Referring Physician: [facility] ER f/u   William Kim is a 58 y.o. male with a h/o HTN, OSA on BiPAP, alcohol use and untreated probable sleep apnea. It is documented in Epic in 2014 that he was treated for afib in the ER. The pt reports that he would have issues with irregular heart beat off and on since then but episodes would be short lived. He noticed the episodes were more frequent over the last few weeks and recently present to the ER, 7/31, with afib with RVR. He was given IV diltiazem  and converted.  He has a Chadsvasc score of 1.  Follow-up A-fib clinic, 06/06/2024.  Patient is currently in atrial flutter. He takes diltiazem  120 mg daily.  He notes to have felt an elevated heart rate over the past few months.  He has also noted intermittent sharp chest pains at different locations around his chest and also intermittent shortness of breath.  He thought it was attributed to extra stress from working additional overtime at work.  He admits that he has not been using his BiPAP regularly.  Follow-up 06/24/2024.  Patient is currently in atrial flutter.  He began Eliquis  last visit after having been noted to be in atrial flutter.  He has not missed any doses of Eliquis  since he started medication.  No bleeding issues on Eliquis .  Today, he denies symptoms of orthopnea, PND, lower extremity edema, dizziness, presyncope, syncope, or neurologic sequela. The patient is tolerating medications without difficulties and is otherwise without complaint today.   Past Medical History:  Diagnosis Date   A-fib (HCC)    Anemia    Phreesia 12/26/2019   Anxiety    Atrial fibrillation (HCC)    Cervicalgia    Encounter for screening colonoscopy 07/02/2018   High cholesterol    Hypertension    Obesity (BMI 30-39.9) 05/31/2018   OSA (obstructive sleep apnea) 05/31/2018   Severe OSA with AHI 79/h with significant oxygen desaturations as low as 58%.   Sleep apnea     Past Surgical History:  Procedure Laterality Date   BIOPSY  05/15/2022   Procedure: BIOPSY;  Surgeon: Shaaron Lamar HERO, MD;  [facility];  Service: Endoscopy;;   COLONOSCOPY WITH PROPOFOL  N/A 08/02/2018   normal   ESOPHAGOGASTRODUODENOSCOPY (EGD) WITH PROPOFOL  N/A 05/15/2022   Procedure: ESOPHAGOGASTRODUODENOSCOPY (EGD) WITH PROPOFOL ;  Surgeon: Shaaron Lamar HERO, MD;  [facility];  Service: Endoscopy;  Laterality: N/A;  11:45 am   MALONEY DILATION N/A 05/15/2022   Procedure: AGAPITO DILATION;  Surgeon: Shaaron Lamar HERO, MD;  [facility];  Service: Endoscopy;  Laterality: N/A;   NECK SURGERY     NERVE SURGERY     None      Current Outpatient Medications  Medication Sig Dispense Refill   alfuzosin  (UROXATRAL ) 10 MG 24 hr tablet Take 1 tablet (10 mg total) by mouth at bedtime. 90 tablet 3   apixaban  (ELIQUIS ) 5 MG TABS tablet Take 1 tablet (5 mg total) by mouth 2 (two) times daily. 60 tablet 3   Ascorbic Acid (VITAMIN C PO) Take 1 tablet by mouth daily.     Capsicum, Cayenne, (CAYENNE PEPPER PO) Taking a pinch of this daily in his drink along with a drop of vinegar and a drop of lemon juice     Collagen-Vitamin C-Biotin (COLLAGEN PO) Taking 1 scoop daily     DANDELION PO Taking - 1 scoop daily  diltiazem  (CARDIZEM  CD) 120 MG 24 hr capsule Take 1 capsule (120 mg total) by mouth daily. 90 capsule 3   GARLIC PO Take 1 tablet by mouth every morning.     hydrOXYzine  (VISTARIL ) 50 MG capsule  (Patient taking differently: Take 50 mg by mouth as needed.)     Misc Natural Products (BEET ROOT PO) Take 3 capsules by mouth every morning.     Moringa Oleifera (MORINGA PO) Taking 1 scoop by mouth daily     sildenafil  (VIAGRA ) 100 MG tablet TAKE 1 TABLET (100 MG TOTAL) BY MOUTH DAILY AS NEEDED. 30 tablet 2   valACYclovir  (VALTREX ) 500 MG tablet Take 1 tablet (500 mg total) by mouth daily. 90 tablet 2   simvastatin  (ZOCOR ) 10 MG tablet TAKE 1 TABLET BY MOUTH EVERY  NIGHT AT BEDTIME (Patient not taking: Reported on 06/24/2024) 30 tablet 2   No current facility-administered medications for this encounter.    No Known Allergies  ROS- All systems are reviewed and negative except as per the HPI above  Physical Exam: Vitals:   06/24/24 0904  BP: 138/80  Pulse: (!) 105  Weight: 88.3 kg  Height: 6' (1.829 m)    Wt Readings from Last 3 Encounters:  06/24/24 88.3 kg  06/06/24 89.8 kg  07/02/23 96.2 kg    Labs: Lab Results  Component Value Date   NA 141 07/02/2023   K 4.2 07/02/2023   CL 104 07/02/2023   CO2 24 07/02/2023   GLUCOSE 90 07/02/2023   BUN 13 07/02/2023   CREATININE 1.09 07/02/2023   CALCIUM 9.3 07/02/2023   Lab Results  Component Value Date   INR 0.9 03/18/2022   Lab Results  Component Value Date   CHOL 166 07/02/2023   HDL 43 07/02/2023   LDLCALC 110 (H) 07/02/2023   TRIG 66 07/02/2023   GEN- The patient is well appearing, alert and oriented x 3 today.   Neck - no JVD or carotid bruit noted Lungs- Clear to ausculation bilaterally, normal work of breathing Heart- Irregular rate and rhythm, no murmurs, rubs or gallops, PMI not laterally displaced Extremities- no clubbing, cyanosis, or edema Skin - no rash or ecchymosis noted   EKG- EKG Interpretation Date/Time:  Friday June 24 2024 09:19:39 EST Ventricular Rate:  105 PR Interval:    QRS Duration:  80 QT Interval:  328 QTC Calculation: 433 R Axis:   79  Text Interpretation: Atrial flutter with variable A-V block Abnormal ECG When compared with ECG of 06-Jun-2024 15:16, PREVIOUS ECG IS PRESENT Confirmed by Terra Pac ([id number]) on 06/24/2024 9:24:51 AM    Epic records reviewed  Echo 07/08/22: 1. Left ventricular ejection fraction, by estimation, is 55 to 60%. The  left ventricle has normal function. The left ventricle has no regional  wall motion abnormalities. Left ventricular diastolic parameters were  normal.   2. Right ventricular systolic function  is normal. The right ventricular  size is normal. There is normal pulmonary artery systolic pressure.   3. Left atrial size was severely dilated.   4. Right atrial size was mildly dilated.   5. The mitral valve is normal in structure. Trivial mitral valve  regurgitation. No evidence of mitral stenosis.   6. The aortic valve is tricuspid. Aortic valve regurgitation is not  visualized. No aortic stenosis is present.   7. Aortic dilatation noted. There is borderline dilatation of the aortic  root, measuring 36 mm.   8. The inferior vena cava is normal in size  with greater than 50%  respiratory variability, suggesting right atrial pressure of 3 mmHg.   CHA2DS2-VASc Score = 1  The patient's score is based upon: CHF History: 0 HTN History: 1 Diabetes History: 0 Stroke History: 0 Vascular Disease History: 0 Age Score: 0 Gender Score: 0       Assessment and Plan: 1. Persistent Afib / flutter  Patient is currently in atrial flutter.  Continue diltiazem  120 mg daily. He was noted to be in atrial flutter at last office visit and started on Eliquis  5 mg twice daily.  He has not missed any doses since starting medication on 12/8. We discussed the procedure cardioversion to try to convert to NSR. We discussed the risks vs benefits of this procedure and how ultimately we cannot predict whether a patient will have early return of arrhythmia post procedure. After discussion, the patient wishes to proceed with cardioversion. We will schedule procedure after 3 weeks of uninterrupted anticoagulation. He will need to remain on Eliquis  4 weeks after cardioversion before discontinuation due to low risk score. Labs drawn today.   Informed Consent   Shared Decision Making/Informed Consent The risks (stroke, cardiac arrhythmias rarely resulting in the need for a temporary or permanent pacemaker, skin irritation or burns and complications associated with conscious sedation including aspiration, arrhythmia,  respiratory failure and death), benefits (restoration of normal sinus rhythm) and alternatives of a direct current cardioversion were explained in detail to William Kim and he agrees to proceed.       2. HTN Stable today.    Follow up 2 weeks after DCCV.    Dorn Heinrich, PA-C [facility] [address] [place], [place] [address] [phone]           "

## 2024-06-24 NOTE — Patient Instructions (Addendum)
 Cardioversion scheduled for: 07/01/24 Friday at 9:00 am    - Arrive at the [employer] [facility] of [facility] [place] ([address])  and check in with ADMITTING at   - Do not eat or drink anything after midnight the night prior to your procedure.   - Take all your morning medication (except diabetic medications) with a sip of water  prior to arrival.  - Do NOT miss any doses of your blood thinner - if you should miss a dose or take a dose more than 4 hours late -- please notify our office immediately.  - You will not be able to drive home after your procedure. Please ensure you have a responsible adult to drive you home. You will need someone with you for 24 hours post procedure.     - Expect to be in the procedural area approximately 2 hours.   - If you feel as if you go back into normal rhythm prior to scheduled cardioversion, please notify our office immediately.   If your procedure is canceled in the cardioversion suite you will be charged a cancellation fee.

## 2024-06-24 NOTE — Progress Notes (Signed)
 "  Primary Care Physician: Edman Meade PEDLAR, FNP Referring Physician: [facility] ER f/u   William Kim is a 58 y.o. male with a h/o HTN, OSA on BiPAP, alcohol use and untreated probable sleep apnea. It is documented in Epic in 2014 that he was treated for afib in the ER. The pt reports that he would have issues with irregular heart beat off and on since then but episodes would be short lived. He noticed the episodes were more frequent over the last few weeks and recently present to the ER, 7/31, with afib with RVR. He was given IV diltiazem  and converted.  He has a Chadsvasc score of 1.  Follow-up A-fib clinic, 06/06/2024.  Patient is currently in atrial flutter. He takes diltiazem  120 mg daily.  He notes to have felt an elevated heart rate over the past few months.  He has also noted intermittent sharp chest pains at different locations around his chest and also intermittent shortness of breath.  He thought it was attributed to extra stress from working additional overtime at work.  He admits that he has not been using his BiPAP regularly.  Follow-up 06/24/2024.  Patient is currently in atrial flutter.  He began Eliquis  last visit after having been noted to be in atrial flutter.  He has not missed any doses of Eliquis  since he started medication.  No bleeding issues on Eliquis .  Today, he denies symptoms of orthopnea, PND, lower extremity edema, dizziness, presyncope, syncope, or neurologic sequela. The patient is tolerating medications without difficulties and is otherwise without complaint today.   Past Medical History:  Diagnosis Date   A-fib (HCC)    Anemia    Phreesia 12/26/2019   Anxiety    Atrial fibrillation (HCC)    Cervicalgia    Encounter for screening colonoscopy 07/02/2018   High cholesterol    Hypertension    Obesity (BMI 30-39.9) 05/31/2018   OSA (obstructive sleep apnea) 05/31/2018   Severe OSA with AHI 79/h with significant oxygen desaturations as low as 58%.   Sleep apnea     Past Surgical History:  Procedure Laterality Date   BIOPSY  05/15/2022   Procedure: BIOPSY;  Surgeon: Shaaron Lamar HERO, MD;  [facility];  Service: Endoscopy;;   COLONOSCOPY WITH PROPOFOL  N/A 08/02/2018   normal   ESOPHAGOGASTRODUODENOSCOPY (EGD) WITH PROPOFOL  N/A 05/15/2022   Procedure: ESOPHAGOGASTRODUODENOSCOPY (EGD) WITH PROPOFOL ;  Surgeon: Shaaron Lamar HERO, MD;  [facility];  Service: Endoscopy;  Laterality: N/A;  11:45 am   MALONEY DILATION N/A 05/15/2022   Procedure: AGAPITO DILATION;  Surgeon: Shaaron Lamar HERO, MD;  [facility];  Service: Endoscopy;  Laterality: N/A;   NECK SURGERY     NERVE SURGERY     None      Current Outpatient Medications  Medication Sig Dispense Refill   alfuzosin  (UROXATRAL ) 10 MG 24 hr tablet Take 1 tablet (10 mg total) by mouth at bedtime. 90 tablet 3   apixaban  (ELIQUIS ) 5 MG TABS tablet Take 1 tablet (5 mg total) by mouth 2 (two) times daily. 60 tablet 3   Ascorbic Acid (VITAMIN C PO) Take 1 tablet by mouth daily.     Capsicum, Cayenne, (CAYENNE PEPPER PO) Taking a pinch of this daily in his drink along with a drop of vinegar and a drop of lemon juice     Collagen-Vitamin C-Biotin (COLLAGEN PO) Taking 1 scoop daily     DANDELION PO Taking - 1 scoop daily  diltiazem  (CARDIZEM  CD) 120 MG 24 hr capsule Take 1 capsule (120 mg total) by mouth daily. 90 capsule 3   GARLIC PO Take 1 tablet by mouth every morning.     hydrOXYzine  (VISTARIL ) 50 MG capsule  (Patient taking differently: Take 50 mg by mouth as needed.)     Misc Natural Products (BEET ROOT PO) Take 3 capsules by mouth every morning.     Moringa Oleifera (MORINGA PO) Taking 1 scoop by mouth daily     sildenafil  (VIAGRA ) 100 MG tablet TAKE 1 TABLET (100 MG TOTAL) BY MOUTH DAILY AS NEEDED. 30 tablet 2   valACYclovir  (VALTREX ) 500 MG tablet Take 1 tablet (500 mg total) by mouth daily. 90 tablet 2   simvastatin  (ZOCOR ) 10 MG tablet TAKE 1 TABLET BY MOUTH EVERY  NIGHT AT BEDTIME (Patient not taking: Reported on 06/24/2024) 30 tablet 2   No current facility-administered medications for this encounter.    No Known Allergies  ROS- All systems are reviewed and negative except as per the HPI above  Physical Exam: Vitals:   06/24/24 0904  BP: 138/80  Pulse: (!) 105  Weight: 88.3 kg  Height: 6' (1.829 m)    Wt Readings from Last 3 Encounters:  06/24/24 88.3 kg  06/06/24 89.8 kg  07/02/23 96.2 kg    Labs: Lab Results  Component Value Date   NA 141 07/02/2023   K 4.2 07/02/2023   CL 104 07/02/2023   CO2 24 07/02/2023   GLUCOSE 90 07/02/2023   BUN 13 07/02/2023   CREATININE 1.09 07/02/2023   CALCIUM 9.3 07/02/2023   Lab Results  Component Value Date   INR 0.9 03/18/2022   Lab Results  Component Value Date   CHOL 166 07/02/2023   HDL 43 07/02/2023   LDLCALC 110 (H) 07/02/2023   TRIG 66 07/02/2023   GEN- The patient is well appearing, alert and oriented x 3 today.   Neck - no JVD or carotid bruit noted Lungs- Clear to ausculation bilaterally, normal work of breathing Heart- Irregular rate and rhythm, no murmurs, rubs or gallops, PMI not laterally displaced Extremities- no clubbing, cyanosis, or edema Skin - no rash or ecchymosis noted   EKG- EKG Interpretation Date/Time:  Friday June 24 2024 09:19:39 EST Ventricular Rate:  105 PR Interval:    QRS Duration:  80 QT Interval:  328 QTC Calculation: 433 R Axis:   79  Text Interpretation: Atrial flutter with variable A-V block Abnormal ECG When compared with ECG of 06-Jun-2024 15:16, PREVIOUS ECG IS PRESENT Confirmed by Terra Pac ([id number]) on 06/24/2024 9:24:51 AM    Epic records reviewed  Echo 07/08/22: 1. Left ventricular ejection fraction, by estimation, is 55 to 60%. The  left ventricle has normal function. The left ventricle has no regional  wall motion abnormalities. Left ventricular diastolic parameters were  normal.   2. Right ventricular systolic function  is normal. The right ventricular  size is normal. There is normal pulmonary artery systolic pressure.   3. Left atrial size was severely dilated.   4. Right atrial size was mildly dilated.   5. The mitral valve is normal in structure. Trivial mitral valve  regurgitation. No evidence of mitral stenosis.   6. The aortic valve is tricuspid. Aortic valve regurgitation is not  visualized. No aortic stenosis is present.   7. Aortic dilatation noted. There is borderline dilatation of the aortic  root, measuring 36 mm.   8. The inferior vena cava is normal in size  with greater than 50%  respiratory variability, suggesting right atrial pressure of 3 mmHg.   CHA2DS2-VASc Score = 1  The patient's score is based upon: CHF History: 0 HTN History: 1 Diabetes History: 0 Stroke History: 0 Vascular Disease History: 0 Age Score: 0 Gender Score: 0       Assessment and Plan: 1. Persistent Afib / flutter  Patient is currently in atrial flutter.  Continue diltiazem  120 mg daily. He was noted to be in atrial flutter at last office visit and started on Eliquis  5 mg twice daily.  He has not missed any doses since starting medication on 12/8. We discussed the procedure cardioversion to try to convert to NSR. We discussed the risks vs benefits of this procedure and how ultimately we cannot predict whether a patient will have early return of arrhythmia post procedure. After discussion, the patient wishes to proceed with cardioversion. We will schedule procedure after 3 weeks of uninterrupted anticoagulation. He will need to remain on Eliquis  4 weeks after cardioversion before discontinuation due to low risk score. Labs drawn today.   Informed Consent   Shared Decision Making/Informed Consent The risks (stroke, cardiac arrhythmias rarely resulting in the need for a temporary or permanent pacemaker, skin irritation or burns and complications associated with conscious sedation including aspiration, arrhythmia,  respiratory failure and death), benefits (restoration of normal sinus rhythm) and alternatives of a direct current cardioversion were explained in detail to Mr. Maysonet and he agrees to proceed.       2. HTN Stable today.    Follow up 2 weeks after DCCV.    Dorn Heinrich, PA-C [facility] [address] [place], [place] [address] [phone]           "

## 2024-06-25 LAB — BASIC METABOLIC PANEL WITH GFR
BUN/Creatinine Ratio: 19 (ref 9–20)
BUN: 21 mg/dL (ref 6–24)
CO2: 24 mmol/L (ref 20–29)
Calcium: 9.8 mg/dL (ref 8.7–10.2)
Chloride: 105 mmol/L (ref 96–106)
Creatinine, Ser: 1.12 mg/dL (ref 0.76–1.27)
Glucose: 98 mg/dL (ref 70–99)
Potassium: 4.3 mmol/L (ref 3.5–5.2)
Sodium: 141 mmol/L (ref 134–144)
eGFR: 77 mL/min/1.73

## 2024-06-25 LAB — CBC
Hematocrit: 46.3 % (ref 37.5–51.0)
Hemoglobin: 15 g/dL (ref 13.0–17.7)
MCH: 31.6 pg (ref 26.6–33.0)
MCHC: 32.4 g/dL (ref 31.5–35.7)
MCV: 98 fL — ABNORMAL HIGH (ref 79–97)
Platelets: 206 x10E3/uL (ref 150–450)
RBC: 4.75 x10E6/uL (ref 4.14–5.80)
RDW: 14.6 % (ref 11.6–15.4)
WBC: 5 x10E3/uL (ref 3.4–10.8)

## 2024-06-27 ENCOUNTER — Ambulatory Visit (HOSPITAL_COMMUNITY): Admitting: Internal Medicine

## 2024-06-27 ENCOUNTER — Ambulatory Visit (HOSPITAL_COMMUNITY): Payer: Self-pay | Admitting: Internal Medicine

## 2024-07-01 ENCOUNTER — Other Ambulatory Visit: Payer: Self-pay

## 2024-07-01 ENCOUNTER — Ambulatory Visit (HOSPITAL_COMMUNITY)
Admission: RE | Admit: 2024-07-01 | Discharge: 2024-07-01 | Disposition: A | Discharge status: Home / Self Care | Attending: Cardiovascular Disease | Admitting: Cardiovascular Disease

## 2024-07-01 ENCOUNTER — Encounter (HOSPITAL_COMMUNITY)
Admission: RE | Disposition: A | Discharge status: Home / Self Care | Payer: Self-pay | Source: Home / Self Care | Attending: Cardiovascular Disease

## 2024-07-01 ENCOUNTER — Other Ambulatory Visit: Payer: Self-pay | Admitting: Family Medicine

## 2024-07-01 ENCOUNTER — Ambulatory Visit (HOSPITAL_COMMUNITY)

## 2024-07-01 DIAGNOSIS — Z7901 Long term (current) use of anticoagulants: Secondary | ICD-10-CM | POA: Diagnosis not present

## 2024-07-01 DIAGNOSIS — Z87891 Personal history of nicotine dependence: Secondary | ICD-10-CM | POA: Diagnosis not present

## 2024-07-01 DIAGNOSIS — G4733 Obstructive sleep apnea (adult) (pediatric): Secondary | ICD-10-CM | POA: Insufficient documentation

## 2024-07-01 DIAGNOSIS — Z79899 Other long term (current) drug therapy: Secondary | ICD-10-CM | POA: Diagnosis not present

## 2024-07-01 DIAGNOSIS — N529 Male erectile dysfunction, unspecified: Secondary | ICD-10-CM

## 2024-07-01 DIAGNOSIS — I4819 Other persistent atrial fibrillation: Secondary | ICD-10-CM | POA: Diagnosis present

## 2024-07-01 DIAGNOSIS — I4891 Unspecified atrial fibrillation: Secondary | ICD-10-CM

## 2024-07-01 DIAGNOSIS — I4892 Unspecified atrial flutter: Secondary | ICD-10-CM | POA: Diagnosis not present

## 2024-07-01 DIAGNOSIS — I10 Essential (primary) hypertension: Secondary | ICD-10-CM | POA: Insufficient documentation

## 2024-07-01 HISTORY — PX: CARDIOVERSION: EP1203

## 2024-07-01 MED ORDER — PROPOFOL 10 MG/ML IV BOLUS
INTRAVENOUS | Status: DC | PRN
  Administered 2024-07-01: 30 mg via INTRAVENOUS
  Administered 2024-07-01: 60 mg via INTRAVENOUS

## 2024-07-01 MED ORDER — SODIUM CHLORIDE 0.9 % IV SOLN: INTRAVENOUS | Status: DC

## 2024-07-01 MED ORDER — LIDOCAINE 2% (20 MG/ML) 5 ML SYRINGE
INTRAMUSCULAR | Status: DC | PRN
  Administered 2024-07-01: 100 mg via INTRAVENOUS

## 2024-07-01 MED ORDER — SODIUM CHLORIDE 0.9% FLUSH
INTRAVENOUS | Status: DC | PRN
  Administered 2024-07-01: 3 mL via INTRAVENOUS
  Administered 2024-07-01: 7 mL via INTRAVENOUS

## 2024-07-01 NOTE — Op Note (Signed)
 Procedure: Electrical Cardioversion Indications:  Atrial Fibrillation  Procedure Details:  Consent: Risks of procedure as well as the alternatives and risks of each were explained to the (patient/caregiver).  Consent for procedure obtained.  Time Out: Verified patient identification, verified procedure, site/side was marked, verified correct patient position, special equipment/implants available, medications/allergies/relevent history reviewed, required imaging and test results available.  Performed  Patient placed on cardiac monitor, pulse oximetry, supplemental oxygen as necessary.  Sedation given: propofol  90 mg IV, Dr. Keneth Pacer pads placed anterior and posterior chest.  Cardioverted 1 time(s).  Cardioversion with synchronized biphasic 200J shock.  Evaluation: Findings: Post procedure EKG shows: NSR Complications: None Patient did tolerate procedure well.  Time Spent Directly with the Patient:  30 minutes   William Kim 07/01/2024, 11:43 AM

## 2024-07-01 NOTE — Interval H&P Note (Signed)
 History and Physical Interval Note:  07/01/2024 10:43 AM  William Kim  has presented today for surgery, with the diagnosis of AFIB.  The various methods of treatment have been discussed with the patient and family. After consideration of risks, benefits and other options for treatment, the patient has consented to  Procedures: CARDIOVERSION (N/A) as a surgical intervention.  The patient's history has been reviewed, patient examined, no change in status, stable for surgery.  I have reviewed the patient's chart and labs.  Questions were answered to the patient's satisfaction.     Dru Primeau

## 2024-07-01 NOTE — Anesthesia Preprocedure Evaluation (Signed)
"                                    Anesthesia Evaluation  Patient identified by MRN, date of birth, ID band Patient awake    Reviewed: Allergy & Precautions, NPO status , Patient's Chart, lab work & pertinent test results, reviewed documented beta blocker date and time   History of Anesthesia Complications Negative for: history of anesthetic complications  Airway Mallampati: II  TM Distance: >3 FB     Dental no notable dental hx.    Pulmonary sleep apnea , neg COPD, former smoker   breath sounds clear to auscultation       Cardiovascular hypertension, (-) CAD, (-) Past MI and (-) Cardiac Stents + dysrhythmias Atrial Fibrillation  Rhythm:Irregular Rate:Normal     Neuro/Psych  Headaches PSYCHIATRIC DISORDERS Anxiety Depression     Neuromuscular disease    GI/Hepatic ,,,(+) neg Cirrhosis    substance abuse  alcohol use  Endo/Other    Renal/GU Renal disease     Musculoskeletal   Abdominal   Peds  Hematology  (+) Blood dyscrasia, anemia   Anesthesia Other Findings   Reproductive/Obstetrics                              Anesthesia Physical Anesthesia Plan  ASA: 2  Anesthesia Plan: General   Post-op Pain Management:    Induction: Intravenous  PONV Risk Score and Plan: 1 and Ondansetron  and Propofol  infusion  Airway Management Planned: Natural Airway and Simple Face Mask  Additional Equipment:   Intra-op Plan:   Post-operative Plan:   Informed Consent: I have reviewed the patients History and Physical, chart, labs and discussed the procedure including the risks, benefits and alternatives for the proposed anesthesia with the patient or authorized representative who has indicated his/her understanding and acceptance.     Dental advisory given  Plan Discussed with: CRNA  Anesthesia Plan Comments:          Anesthesia Quick Evaluation  "

## 2024-07-01 NOTE — Transfer of Care (Signed)
 Immediate Anesthesia Transfer of Care Note  Patient: William Kim  Procedure(s) Performed: CARDIOVERSION  Patient Location: PACU and Cath Lab  Anesthesia Type:MAC  Level of Consciousness: awake and alert   Airway & Oxygen Therapy: Patient Spontanous Breathing and Patient connected to nasal cannula oxygen  Post-op Assessment: Report given to RN and Post -op Vital signs reviewed and stable  Post vital signs: Reviewed and stable  Last Vitals:  Vitals Value Taken Time  BP 127/79 07/01/24 1146  Temp    Pulse 82 07/01/24 1146  Resp 20 07/01/24 1146  SpO2 99% 07/01/24 1146    Last Pain:  Vitals:   07/01/24 1027  TempSrc: Temporal         Complications: No notable events documented.

## 2024-07-01 NOTE — Discharge Instructions (Signed)

## 2024-07-01 NOTE — Anesthesia Postprocedure Evaluation (Signed)
"   Anesthesia Post Note  Patient: William Kim  Procedure(s) Performed: CARDIOVERSION     Patient location during evaluation: PACU Anesthesia Type: General Level of consciousness: awake and alert Pain management: pain level controlled Vital Signs Assessment: post-procedure vital signs reviewed and stable Respiratory status: spontaneous breathing, nonlabored ventilation, respiratory function stable and patient connected to nasal cannula oxygen Cardiovascular status: blood pressure returned to baseline and stable Postop Assessment: no apparent nausea or vomiting Anesthetic complications: no   There were no known notable events for this encounter.  Last Vitals:  Vitals:   07/01/24 1155 07/01/24 1205  BP: (!) 127/98 (!) 126/95  Pulse: 77 81  Resp: 12 17  Temp:    SpO2: 97% 97%    Last Pain:  Vitals:   07/01/24 1205  TempSrc:   PainSc: 0-No pain                 Lynwood MARLA Cornea      "

## 2024-07-02 ENCOUNTER — Encounter (HOSPITAL_COMMUNITY): Payer: Self-pay | Admitting: Cardiovascular Disease

## 2024-07-07 ENCOUNTER — Other Ambulatory Visit: Payer: Self-pay | Admitting: Family Medicine

## 2024-07-11 ENCOUNTER — Ambulatory Visit (HOSPITAL_COMMUNITY): Admitting: Internal Medicine

## 2024-07-11 ENCOUNTER — Encounter (HOSPITAL_COMMUNITY): Payer: Self-pay

## 2024-07-12 MED ORDER — TADALAFIL 20 MG PO TABS: ORAL_TABLET | ORAL | 0 refills | Status: AC

## 2024-07-12 MED ORDER — TADALAFIL 20 MG PO TABS: ORAL_TABLET | ORAL | 0 refills | Status: DC

## 2024-07-12 NOTE — Telephone Encounter (Signed)
 Called patient to make him aware MD McKenzie will have him try tadalafil  patient confirmed pharmacy rx was sent

## 2024-07-12 NOTE — Addendum Note (Signed)
 Addended by: SAMMIE EXIE HERO on: 07/12/2024 03:13 PM   Modules accepted: Orders

## 2024-07-13 ENCOUNTER — Ambulatory Visit: Payer: 59 | Admitting: Urology

## 2024-07-15 ENCOUNTER — Ambulatory Visit (HOSPITAL_COMMUNITY): Admitting: Internal Medicine

## 2024-07-20 ENCOUNTER — Ambulatory Visit (HOSPITAL_COMMUNITY)
Admission: RE | Admit: 2024-07-20 | Discharge: 2024-07-20 | Disposition: A | Discharge status: Home / Self Care | Source: Ambulatory Visit | Attending: Internal Medicine | Admitting: Internal Medicine

## 2024-07-20 ENCOUNTER — Encounter (HOSPITAL_COMMUNITY): Payer: Self-pay | Admitting: Internal Medicine

## 2024-07-20 VITALS — BP 124/92 | HR 96 | Ht 72.0 in | Wt 192.4 lb

## 2024-07-20 DIAGNOSIS — I4892 Unspecified atrial flutter: Secondary | ICD-10-CM

## 2024-07-20 DIAGNOSIS — I4819 Other persistent atrial fibrillation: Secondary | ICD-10-CM

## 2024-07-20 DIAGNOSIS — M5432 Sciatica, left side: Secondary | ICD-10-CM

## 2024-07-20 MED ORDER — FLECAINIDE ACETATE 50 MG PO TABS: 50.0000 mg | ORAL_TABLET | Freq: Two times a day (BID) | ORAL | 1 refills | Status: DC

## 2024-07-20 NOTE — Progress Notes (Signed)
 "  Primary Care Physician: Edman Meade PEDLAR, FNP Referring Physician: [facility] ER f/u   William Kim is a 59 y.o. male with a h/o HTN, OSA on BiPAP, alcohol use and untreated probable sleep apnea. It is documented in Epic in 2014 that he was treated for afib in the ER. The pt reports that he would have issues with irregular heart beat off and on since then but episodes would be short lived. He noticed the episodes were more frequent over the last few weeks and recently present to the ER, 7/31, with afib with RVR. He was given IV diltiazem  and converted.  He has a Chadsvasc score of 1.  Follow-up A-fib clinic, 06/06/2024.  Patient is currently in atrial flutter. He takes diltiazem  120 mg daily.  He notes to have felt an elevated heart rate over the past few months.  He has also noted intermittent sharp chest pains at different locations around his chest and also intermittent shortness of breath.  He thought it was attributed to extra stress from working additional overtime at work.  He admits that he has not been using his BiPAP regularly.  Follow-up 06/24/2024.  Patient is currently in atrial flutter.  He began Eliquis  last visit after having been noted to be in atrial flutter.  He has not missed any doses of Eliquis  since he started medication.  No bleeding issues on Eliquis .  Follow up 07/20/24. Patient is currently in atrial flutter. S/p successful DCCV on 07/01/24. No missed doses of Eliquis .  Today, he denies symptoms of orthopnea, PND, lower extremity edema, dizziness, presyncope, syncope, or neurologic sequela. The patient is tolerating medications without difficulties and is otherwise without complaint today.   Past Medical History:  Diagnosis Date   A-fib (HCC)    Anemia    Phreesia 12/26/2019   Anxiety    Atrial fibrillation (HCC)    Cervicalgia    Encounter for screening colonoscopy 07/02/2018   High cholesterol    Hypertension    Obesity (BMI 30-39.9) 05/31/2018   OSA (obstructive  sleep apnea) 05/31/2018   Severe OSA with AHI 79/h with significant oxygen desaturations as low as 58%.   Sleep apnea    Past Surgical History:  Procedure Laterality Date   BIOPSY  05/15/2022   Procedure: BIOPSY;  Surgeon: Shaaron Lamar HERO, MD;  Location: [facility];  Service: Endoscopy;;   CARDIOVERSION N/A 07/01/2024   Procedure: CARDIOVERSION;  Surgeon: Francyne Headland, MD;  Location: [facility] INVAS[facility] CV LAB;  Service: Cardiovascular;  Laterality: N/A;   COLONOSCOPY WITH PROPOFOL  N/A 08/02/2018   normal   ESOPHAGOGASTRODUODENOSCOPY (EGD) WITH PROPOFOL  N/A 05/15/2022   Procedure: ESOPHAGOGASTRODUODENOSCOPY (EGD) WITH PROPOFOL ;  Surgeon: Shaaron Lamar HERO, MD;  Location: [facility];  Service: Endoscopy;  Laterality: N/A;  11:45 am   MALONEY DILATION N/A 05/15/2022   Procedure: AGAPITO DILATION;  Surgeon: Shaaron Lamar HERO, MD;  Location: [facility];  Service: Endoscopy;  Laterality: N/A;   NECK SURGERY     NERVE SURGERY     None      Current Outpatient Medications  Medication Sig Dispense Refill   alfuzosin  (UROXATRAL ) 10 MG 24 hr tablet Take 1 tablet (10 mg total) by mouth at bedtime. 90 tablet 3   apixaban  (ELIQUIS ) 5 MG TABS tablet Take 1 tablet (5 mg total) by mouth 2 (two) times daily. 60 tablet 3   Ascorbic Acid (VITAMIN C PO) Take 1 tablet by mouth daily.     Capsicum, Cayenne, (CAYENNE PEPPER  PO) Taking a pinch of this daily in his drink along with a drop of vinegar and a drop of lemon juice Mon-Friday     Collagen-Vitamin C-Biotin (COLLAGEN PO) Taking 1 scoop daily Mon-Fri     DANDELION PO Taking - 1 scoop Mon-Fri     diltiazem  (CARDIZEM  CD) 120 MG 24 hr capsule Take 1 capsule (120 mg total) by mouth daily. 90 capsule 3   flecainide  (TAMBOCOR ) 50 MG tablet Take 1 tablet (50 mg total) by mouth 2 (two) times daily. 180 tablet 1   GARLIC PO Take 1 tablet by mouth every morning.     hydrOXYzine  (VISTARIL ) 50 MG capsule      Menthol, Topical Analgesic, 16 % GEL Apply 1 Application  topically daily as needed.     Misc Natural Products (BEET ROOT PO) Take 1 Scoop by mouth See admin instructions. Mon-Friday     Moringa Oleifera (MORINGA PO) Taking 1 scoop by mouth Mon-Fri     Oxymetazoline HCl (NASAL SPRAY NA) Place 2 sprays into both nostrils daily as needed. [facility] Nasal 4     sildenafil  (VIAGRA ) 100 MG tablet TAKE 1 TABLET BY MOUTH EVERY DAY AS NEEDED 30 tablet 2   simvastatin  (ZOCOR ) 10 MG tablet TAKE 1 TABLET BY MOUTH EVERY NIGHT AT BEDTIME 30 tablet 2   tadalafil  (CIALIS ) 20 MG tablet Take one 20 mg tablet at least 60 minutes prior to anticipated sexual activity to achieve erection. Do not use more than once per day. 30 tablet 0   tetrahydrozoline 0.05 % ophthalmic solution Place 1 drop into both eyes daily as needed (dry eyes/redness).     valACYclovir  (VALTREX ) 500 MG tablet Take 1 tablet (500 mg total) by mouth daily. 90 tablet 2   No current facility-administered medications for this encounter.    No Known Allergies  ROS- All systems are reviewed and negative except as per the HPI above  Physical Exam: Vitals:   07/20/24 1530  BP: (!) 124/92  Pulse: 96  Weight: 87.3 kg  Height: 6' (1.829 m)     Wt Readings from Last 3 Encounters:  07/20/24 87.3 kg  06/24/24 88.3 kg  06/06/24 89.8 kg    Labs: Lab Results  Component Value Date   NA 141 06/24/2024   K 4.3 06/24/2024   CL 105 06/24/2024   CO2 24 06/24/2024   GLUCOSE 98 06/24/2024   BUN 21 06/24/2024   CREATININE 1.12 06/24/2024   CALCIUM 9.8 06/24/2024   Lab Results  Component Value Date   INR 0.9 03/18/2022   Lab Results  Component Value Date   CHOL 166 07/02/2023   HDL 43 07/02/2023   LDLCALC 110 (H) 07/02/2023   TRIG 66 07/02/2023   GEN- The patient is well appearing, alert and oriented x 3 today.   Neck - no JVD or carotid bruit noted Lungs- Clear to ausculation bilaterally, normal work of breathing Heart- Irregular rate and rhythm, no murmurs, rubs or gallops, PMI not  laterally displaced Extremities- no clubbing, cyanosis, or edema Skin - no rash or ecchymosis noted   EKG- EKG Interpretation Date/Time:  Wednesday July 20 2024 15:32:57 EST Ventricular Rate:  96 PR Interval:    QRS Duration:  82 QT Interval:  334 QTC Calculation: 421 R Axis:   64  Text Interpretation: Atrial flutter with variable A-V block Possible Inferior infarct , age undetermined Abnormal ECG When compared with ECG of 01-Jul-2024 11:49, Atrial flutter has replaced Sinus rhythm Confirmed by Terra,  Fairy [phone]) on 07/20/2024 3:57:23 PM     Epic records reviewed  Echo 07/08/22: 1. Left ventricular ejection fraction, by estimation, is 55 to 60%. The  left ventricle has normal function. The left ventricle has no regional  wall motion abnormalities. Left ventricular diastolic parameters were  normal.   2. Right ventricular systolic function is normal. The right ventricular  size is normal. There is normal pulmonary artery systolic pressure.   3. Left atrial size was severely dilated.   4. Right atrial size was mildly dilated.   5. The mitral valve is normal in structure. Trivial mitral valve  regurgitation. No evidence of mitral stenosis.   6. The aortic valve is tricuspid. Aortic valve regurgitation is not  visualized. No aortic stenosis is present.   7. Aortic dilatation noted. There is borderline dilatation of the aortic  root, measuring 36 mm.   8. The inferior vena cava is normal in size with greater than 50%  respiratory variability, suggesting right atrial pressure of 3 mmHg.   CHA2DS2-VASc Score = 1  The patient's score is based upon: CHF History: 0 HTN History: 1 Diabetes History: 0 Stroke History: 0 Vascular Disease History: 0 Age Score: 0 Gender Score: 0       Assessment and Plan: 1. Persistent Afib / flutter S/p DCCV on 07/01/24.  Patient is currently in atrial flutter. Continue diltiazem  120 mg daily.  We discussed ER AF and options for rhythm  control.  Due to patient's young age, we have agreed to proceed with temporary AAD therapy and patient would like to discuss ablation with EP.  We went over the procedure and what to expect during the recovery period.  We discussed temporary rhythm control to help try to restore and maintain sinus rhythm while patient waits to discuss ablation with EP.  After discussion, patient will begin flecainide  50 mg twice daily.  I will recheck an ECG in 7 to 10 days and determine if patient needs repeat cardioversion.  He was instructed to continue Eliquis  5 mg twice daily without interruption in preparation for possible repeat cardioversion.   2. HTN Stable today.     Follow up 7 to 10 days for repeat ECG.    Dorn Heinrich, PA-C [facility] [address] [place], [place] [address] [phone]           "

## 2024-07-20 NOTE — Patient Instructions (Signed)
 flecainide  (TAMBOCOR ) 50 MG  twice a day

## 2024-07-25 ENCOUNTER — Ambulatory Visit: Admitting: Urology

## 2024-07-28 ENCOUNTER — Ambulatory Visit (HOSPITAL_COMMUNITY): Admitting: Internal Medicine

## 2024-07-28 ENCOUNTER — Ambulatory Visit (HOSPITAL_COMMUNITY)
Admission: RE | Admit: 2024-07-28 | Discharge: 2024-07-28 | Disposition: A | Discharge status: Home / Self Care | Source: Ambulatory Visit | Attending: Internal Medicine | Admitting: Internal Medicine

## 2024-07-28 VITALS — HR 106

## 2024-07-28 DIAGNOSIS — I4819 Other persistent atrial fibrillation: Secondary | ICD-10-CM

## 2024-07-28 DIAGNOSIS — I4891 Unspecified atrial fibrillation: Secondary | ICD-10-CM | POA: Diagnosis not present

## 2024-07-28 MED ORDER — FLECAINIDE ACETATE 100 MG PO TABS: 100.0000 mg | ORAL_TABLET | Freq: Two times a day (BID) | ORAL | 3 refills | Status: AC

## 2024-07-28 MED ORDER — APIXABAN 5 MG PO TABS: 5.0000 mg | ORAL_TABLET | Freq: Two times a day (BID) | ORAL | 6 refills | Status: AC

## 2024-07-28 NOTE — Addendum Note (Signed)
 Encounter addended by: Janel Nancy SAUNDERS, RN on: 07/28/2024 4:30 PM  Actions taken: Order list changed, Diagnosis association updated

## 2024-07-28 NOTE — Progress Notes (Signed)
 Patient began flecainide  50 mg twice daily on 07/20/2024 after having ER AF following cardioversion on 1/2.  He is tolerating the medication without issue. No missed doses of Eliquis .   ECG today shows  EKG Interpretation Date/Time:  Thursday July 28 2024 15:30:53 EST Ventricular Rate:  106 PR Interval:    QRS Duration:  88 QT Interval:  312 QTC Calculation: 414 R Axis:   75  Text Interpretation: Atrial flutter with variable A-V block Minimal voltage criteria for LVH, may be normal variant ( Sokolow-Lyon ) Inferior infarct (cited on or before 20-Jul-2024) Abnormal ECG When compared with ECG of 20-Jul-2024 15:32, No significant change was found Confirmed by Terra Pac ([id number]) on 07/28/2024 3:33:48 PM    Increase flecainide  to 100 mg twice daily.  Continue diltiazem  120 mg daily.  We discussed repeat cardioversion to try to restore sinus rhythm. We discussed the procedure cardioversion to try to convert to NSR. We discussed the risks vs benefits of this procedure and how ultimately we cannot predict whether a patient will have early return of arrhythmia post procedure. After discussion, the patient wishes to proceed with cardioversion. Labs drawn today.   Informed Consent   Shared Decision Making/Informed Consent The risks (stroke, cardiac arrhythmias rarely resulting in the need for a temporary or permanent pacemaker, skin irritation or burns and complications associated with conscious sedation including aspiration, arrhythmia, respiratory failure and death), benefits (restoration of normal sinus rhythm) and alternatives of a direct current cardioversion were explained in detail to William Kim and he agrees to proceed.       Follow-up 2 weeks after DCCV.

## 2024-07-28 NOTE — Patient Instructions (Addendum)
 Increase flecainide  (TAMBOCOR ) 100 MG twice a day    Cardioversion scheduled for: Call us  to make appointment    - Arrive at the Main Entrance A of [facility] ([address])  and check in with ADMITTING at   - Do not eat or drink anything after midnight the night prior to your procedure.   - Take all your morning medication (except diabetic medications) with a sip of water  prior to arrival.  - Do NOT miss any doses of your blood thinner - if you should miss a dose or take a dose more than 4 hours late -- please notify our office immediately.  - You will not be able to drive home after your procedure. Please ensure you have a responsible adult to drive you home. You will need someone with you for 24 hours post procedure.     - Expect to be in the procedural area approximately 2 hours.   - If you feel as if you go back into normal rhythm prior to scheduled cardioversion, please notify our office immediately.   If your procedure is canceled in the cardioversion suite you will be charged a cancellation fee.

## 2024-07-29 ENCOUNTER — Ambulatory Visit (HOSPITAL_COMMUNITY): Payer: Self-pay | Admitting: Internal Medicine

## 2024-07-29 LAB — BASIC METABOLIC PANEL WITH GFR
BUN/Creatinine Ratio: 20 (ref 9–20)
BUN: 23 mg/dL (ref 6–24)
CO2: 22 mmol/L (ref 20–29)
Calcium: 9.1 mg/dL (ref 8.7–10.2)
Chloride: 107 mmol/L — ABNORMAL HIGH (ref 96–106)
Creatinine, Ser: 1.14 mg/dL (ref 0.76–1.27)
Glucose: 78 mg/dL (ref 70–99)
Potassium: 4.4 mmol/L (ref 3.5–5.2)
Sodium: 143 mmol/L (ref 134–144)
eGFR: 75 mL/min/{1.73_m2}

## 2024-07-29 LAB — CBC
Hematocrit: 41.8 % (ref 37.5–51.0)
Hemoglobin: 13.8 g/dL (ref 13.0–17.7)
MCH: 31.5 pg (ref 26.6–33.0)
MCHC: 33 g/dL (ref 31.5–35.7)
MCV: 95 fL (ref 79–97)
Platelets: 208 10*3/uL (ref 150–450)
RBC: 4.38 x10E6/uL (ref 4.14–5.80)
RDW: 14.3 % (ref 11.6–15.4)
WBC: 5 10*3/uL (ref 3.4–10.8)

## 2024-08-04 ENCOUNTER — Ambulatory Visit (HOSPITAL_COMMUNITY)
Admission: RE | Admit: 2024-08-04 | Discharge: 2024-08-04 | Disposition: A | Source: Ambulatory Visit | Attending: Internal Medicine | Admitting: Internal Medicine

## 2024-08-04 DIAGNOSIS — I4891 Unspecified atrial fibrillation: Secondary | ICD-10-CM | POA: Diagnosis not present

## 2024-08-04 NOTE — Progress Notes (Signed)
 Patient is here today because he believes he went back into normal rhythm. He is feeling better since transition to flecainide  100 mg BID. No missed doses of OAC. He is scheduled for cardioversion tomorrow.   ECG today shows  EKG Interpretation Date/Time:  Thursday August 04 2024 15:19:26 EST Ventricular Rate:  95 PR Interval:    QRS Duration:  94 QT Interval:  334 QTC Calculation: 419 R Axis:   74  Text Interpretation: Atrial flutter with variable A-V block Minimal voltage criteria for LVH, may be normal variant ( Sokolow-Lyon ) Abnormal ECG When compared with ECG of 28-Jul-2024 15:30, PREVIOUS ECG IS PRESENT Confirmed by Terra Pac ([id number]) on 08/04/2024 3:37:30 PM    Continue all medications without change. Follow up as scheduled for cardioversion tomorrow.

## 2024-08-04 NOTE — H&P (View-Only) (Signed)
 Patient is here today because he believes he went back into normal rhythm. He is feeling better since transition to flecainide  100 mg BID. No missed doses of OAC. He is scheduled for cardioversion tomorrow.   ECG today shows  EKG Interpretation Date/Time:  Thursday August 04 2024 15:19:26 EST Ventricular Rate:  95 PR Interval:    QRS Duration:  94 QT Interval:  334 QTC Calculation: 419 R Axis:   74  Text Interpretation: Atrial flutter with variable A-V block Minimal voltage criteria for LVH, may be normal variant ( Sokolow-Lyon ) Abnormal ECG When compared with ECG of 28-Jul-2024 15:30, PREVIOUS ECG IS PRESENT Confirmed by Terra Pac ([id number]) on 08/04/2024 3:37:30 PM    Continue all medications without change. Follow up as scheduled for cardioversion tomorrow.

## 2024-08-05 ENCOUNTER — Ambulatory Visit (HOSPITAL_COMMUNITY): Admitting: Anesthesiology

## 2024-08-05 ENCOUNTER — Other Ambulatory Visit: Payer: Self-pay

## 2024-08-05 ENCOUNTER — Encounter (HOSPITAL_COMMUNITY)
Admission: RE | Disposition: A | Discharge status: Home / Self Care | Payer: Self-pay | Source: Home / Self Care | Attending: Internal Medicine

## 2024-08-05 ENCOUNTER — Encounter (HOSPITAL_COMMUNITY): Payer: Self-pay | Admitting: Internal Medicine

## 2024-08-05 ENCOUNTER — Ambulatory Visit (HOSPITAL_COMMUNITY)
Admission: RE | Admit: 2024-08-05 | Discharge: 2024-08-05 | Disposition: A | Source: Home / Self Care | Attending: Internal Medicine | Admitting: Internal Medicine

## 2024-08-05 DIAGNOSIS — I4819 Other persistent atrial fibrillation: Secondary | ICD-10-CM

## 2024-08-05 MED ORDER — SODIUM CHLORIDE 0.9 % IV SOLN: INTRAVENOUS | Status: DC

## 2024-08-05 MED ORDER — PROPOFOL 10 MG/ML IV BOLUS
INTRAVENOUS | Status: DC | PRN
  Administered 2024-08-05: 90 mg via INTRAVENOUS

## 2024-08-05 MED ORDER — LIDOCAINE HCL (PF) 2 % IJ SOLN
INTRAMUSCULAR | Status: DC | PRN
  Administered 2024-08-05: 60 mg via INTRADERMAL

## 2024-08-05 NOTE — Interval H&P Note (Signed)
 History and Physical Interval Note:  08/05/2024 10:44 AM  William Kim  has presented today for surgery, with the diagnosis of AFIB.  The various methods of treatment have been discussed with the patient and family. After consideration of risks, benefits and other options for treatment, the patient has consented to  Procedures: CARDIOVERSION (N/A) as a surgical intervention.  The patient's history has been reviewed, patient examined, no change in status, stable for surgery.  I have reviewed the patient's chart and labs.  Questions were answered to the patient's satisfaction.     [name]

## 2024-08-05 NOTE — Transfer of Care (Signed)
 Immediate Anesthesia Transfer of Care Note  Patient: William Kim  Procedure(s) Performed: CARDIOVERSION  Patient Location: Cath Lab  Anesthesia Type:General  Level of Consciousness: drowsy and patient cooperative  Airway & Oxygen Therapy: Patient Spontanous Breathing and Patient connected to nasal cannula oxygen  Post-op Assessment: Report given to RN  Post vital signs: Reviewed and stable  Last Vitals:  Vitals Value Taken Time  BP 114/79 08/05/24 10:56  Temp    Pulse 78 08/05/24 10:56  Resp 10 08/05/24 10:56  SpO2 99 08/05/24 10:56    Last Pain:  Vitals:   08/05/24 1030  TempSrc: Tympanic  PainSc:          Complications: No notable events documented.

## 2024-08-05 NOTE — Anesthesia Postprocedure Evaluation (Signed)
"   Anesthesia Post Note  Patient: William Kim  Procedure(s) Performed: CARDIOVERSION     Patient location during evaluation: PACU Anesthesia Type: General Level of consciousness: awake Pain management: pain level controlled Vital Signs Assessment: post-procedure vital signs reviewed and stable Respiratory status: spontaneous breathing, nonlabored ventilation and respiratory function stable Cardiovascular status: blood pressure returned to baseline and stable Postop Assessment: no apparent nausea or vomiting Anesthetic complications: no   No notable events documented.  Last Vitals:  Vitals:   08/05/24 1110 08/05/24 1115  BP: 120/80 114/86  Pulse: 69 74  Resp: 16 15  Temp:    SpO2: 97% 98%    Last Pain:  Vitals:   08/05/24 1055  TempSrc:   PainSc: 0-No pain                 Delon Aisha Arch      "

## 2024-08-05 NOTE — CV Procedure (Signed)
" ° ° °  CARDIOVERSION NOTE  Procedure: Electrical Cardioversion Indications:  Atrial Flutter  Procedure Details:  Consent: Risks of procedure as well as the alternatives and risks of each were explained to the (patient/caregiver).  Consent for procedure obtained.  Time Out: Verified patient identification, verified procedure, site/side was marked, verified correct patient position, special equipment/implants available, medications/allergies/relevent history reviewed, required imaging and test results available.  Performed  Patient placed on cardiac monitor, pulse oximetry, supplemental oxygen as necessary.  Sedation given: propofol  per anesthesia Pacer pads placed anterior and posterior chest.  Cardioverted 1 time(s).  Cardioverted at 200J biphasic.  Impression: Findings: Post procedure EKG shows: NSR Complications: None Patient did tolerate procedure well.  Plan: Successful DCCV with a single 200J biphasic shock to NSR.  Time Spent Directly with the Patient:  30 minutes   Vinie KYM Maxcy, MD, [facility], FNLA, FACP  [place]  Medical Director of the [facility]id [facility] &  Cardiovascular Risk Reduction Clinic Diplomate of the American Board of Clinical Lipidology Attending Cardiologist  Direct Dial: [phone]  Fax: [phone]  Website:  [email or web address] Vinie BROCKS Lexey Fletes 08/05/2024, 10:55 AM     "

## 2024-08-05 NOTE — Anesthesia Preprocedure Evaluation (Addendum)
 "                                  Anesthesia Evaluation  Patient identified by MRN, date of birth, ID band Patient awake    Reviewed: Allergy & Precautions, NPO status , Patient's Chart, lab work & pertinent test results  History of Anesthesia Complications Negative for: history of anesthetic complications  Airway Mallampati: II  TM Distance: >3 FB Neck ROM: Full    Dental  (+) Dental Advisory Given   Pulmonary neg shortness of breath, sleep apnea , neg COPD, neg recent URI, former smoker   Pulmonary exam normal breath sounds clear to auscultation       Cardiovascular hypertension, (-) angina (-) Past MI, (-) Cardiac Stents and (-) CABG + dysrhythmias Atrial Fibrillation  Rhythm:Irregular Rate:Tachycardia  HLD  TTE 07/08/2022: IMPRESSIONS    1. Left ventricular ejection fraction, by estimation, is 55 to 60%. The  left ventricle has normal function. The left ventricle has no regional  wall motion abnormalities. Left ventricular diastolic parameters were  normal.   2. Right ventricular systolic function is normal. The right ventricular  size is normal. There is normal pulmonary artery systolic pressure.   3. Left atrial size was severely dilated.   4. Right atrial size was mildly dilated.   5. The mitral valve is normal in structure. Trivial mitral valve  regurgitation. No evidence of mitral stenosis.   6. The aortic valve is tricuspid. Aortic valve regurgitation is not  visualized. No aortic stenosis is present.   7. Aortic dilatation noted. There is borderline dilatation of the aortic  root, measuring 36 mm.   8. The inferior vena cava is normal in size with greater than 50%  respiratory variability, suggesting right atrial pressure of 3 mmHg.     Neuro/Psych  Headaches, neg Seizures PSYCHIATRIC DISORDERS Anxiety Depression     Neuromuscular disease (sciatica)    GI/Hepatic negative GI ROS, Neg liver ROS,,,  Endo/Other  negative endocrine ROS     Renal/GU negative Renal ROS Bladder dysfunction      Musculoskeletal   Abdominal   Peds  Hematology  (+) Blood dyscrasia, anemia Lab Results      Component                Value               Date                      WBC                      5.0                 07/28/2024                HGB                      13.8                07/28/2024                HCT                      41.8                07/28/2024  MCV                      95                  07/28/2024                PLT                      208                 07/28/2024              Anesthesia Other Findings   Reproductive/Obstetrics                              Anesthesia Physical Anesthesia Plan  ASA: 2  Anesthesia Plan: General   Post-op Pain Management: Minimal or no pain anticipated   Induction: Intravenous  PONV Risk Score and Plan: 2 and Treatment may vary due to age or medical condition  Airway Management Planned: Natural Airway and Nasal Cannula  Additional Equipment:   Intra-op Plan:   Post-operative Plan:   Informed Consent: I have reviewed the patients History and Physical, chart, labs and discussed the procedure including the risks, benefits and alternatives for the proposed anesthesia with the patient or authorized representative who has indicated his/her understanding and acceptance.       Plan Discussed with: Anesthesiologist and CRNA  Anesthesia Plan Comments: (Risks of general anesthesia discussed including, but not limited to, sore throat, hoarse voice, chipped/damaged teeth, injury to vocal cords, nausea and vomiting, allergic reactions, lung infection, heart attack, stroke, and death. All questions answered. )         Anesthesia Quick Evaluation  "

## 2024-08-18 ENCOUNTER — Ambulatory Visit (HOSPITAL_COMMUNITY): Admitting: Internal Medicine

## 2025-01-31 ENCOUNTER — Other Ambulatory Visit

## 2025-02-08 ENCOUNTER — Ambulatory Visit: Admitting: Urology
